# Patient Record
Sex: Female | Born: 1935 | Race: Black or African American | Hispanic: No | Smoking: Never smoker
Health system: Southern US, Community
[De-identification: ages and names within clinical notes are randomized; demographics above are authoritative.]

## PROBLEM LIST (undated history)

## (undated) DIAGNOSIS — I2510 Atherosclerotic heart disease of native coronary artery without angina pectoris: Secondary | ICD-10-CM

## (undated) DIAGNOSIS — I82409 Acute embolism and thrombosis of unspecified deep veins of unspecified lower extremity: Secondary | ICD-10-CM

## (undated) DIAGNOSIS — E871 Hypo-osmolality and hyponatremia: Secondary | ICD-10-CM

## (undated) DIAGNOSIS — I639 Cerebral infarction, unspecified: Secondary | ICD-10-CM

## (undated) DIAGNOSIS — R569 Unspecified convulsions: Secondary | ICD-10-CM

## (undated) DIAGNOSIS — Z8739 Personal history of other diseases of the musculoskeletal system and connective tissue: Secondary | ICD-10-CM

## (undated) DIAGNOSIS — H269 Unspecified cataract: Secondary | ICD-10-CM

## (undated) DIAGNOSIS — M316 Other giant cell arteritis: Secondary | ICD-10-CM

## (undated) DIAGNOSIS — F0390 Unspecified dementia without behavioral disturbance: Secondary | ICD-10-CM

## (undated) DIAGNOSIS — I679 Cerebrovascular disease, unspecified: Secondary | ICD-10-CM

## (undated) DIAGNOSIS — K8020 Calculus of gallbladder without cholecystitis without obstruction: Secondary | ICD-10-CM

## (undated) DIAGNOSIS — E785 Hyperlipidemia, unspecified: Secondary | ICD-10-CM

## (undated) DIAGNOSIS — N183 Chronic kidney disease, stage 3 unspecified: Secondary | ICD-10-CM

## (undated) DIAGNOSIS — N179 Acute kidney failure, unspecified: Secondary | ICD-10-CM

## (undated) DIAGNOSIS — H409 Unspecified glaucoma: Secondary | ICD-10-CM

## (undated) DIAGNOSIS — M1990 Unspecified osteoarthritis, unspecified site: Secondary | ICD-10-CM

## (undated) DIAGNOSIS — I469 Cardiac arrest, cause unspecified: Secondary | ICD-10-CM

## (undated) DIAGNOSIS — I10 Essential (primary) hypertension: Secondary | ICD-10-CM

## (undated) HISTORY — PX: ABDOMINAL HYSTERECTOMY: SHX81

## (undated) HISTORY — DX: Calculus of gallbladder without cholecystitis without obstruction: K80.20

## (undated) HISTORY — DX: Chronic kidney disease, stage 3 unspecified: N18.30

## (undated) HISTORY — PX: CORONARY ANGIOPLASTY WITH STENT PLACEMENT: SHX49

## (undated) HISTORY — DX: Cardiac arrest, cause unspecified: I46.9

## (undated) HISTORY — DX: Unspecified glaucoma: H40.9

## (undated) HISTORY — DX: Unspecified cataract: H26.9

## (undated) HISTORY — DX: Personal history of other diseases of the musculoskeletal system and connective tissue: Z87.39

## (undated) HISTORY — DX: Acute kidney failure, unspecified: N17.9

## (undated) HISTORY — DX: Chronic kidney disease, stage 3 (moderate): N18.3

## (undated) HISTORY — PX: EYE SURGERY: SHX253

## (undated) HISTORY — DX: Unspecified convulsions: R56.9

## (undated) HISTORY — DX: Hyperlipidemia, unspecified: E78.5

---

## 1998-01-08 ENCOUNTER — Other Ambulatory Visit: Admission: RE | Admit: 1998-01-08 | Discharge: 1998-01-08 | Payer: Self-pay | Admitting: Family Medicine

## 1998-06-12 ENCOUNTER — Ambulatory Visit (HOSPITAL_COMMUNITY): Admission: RE | Admit: 1998-06-12 | Discharge: 1998-06-12 | Payer: Self-pay | Admitting: Family Medicine

## 1999-01-27 ENCOUNTER — Emergency Department (HOSPITAL_COMMUNITY): Admission: EM | Admit: 1999-01-27 | Discharge: 1999-01-27 | Payer: Self-pay | Admitting: Emergency Medicine

## 1999-08-03 ENCOUNTER — Encounter: Payer: Self-pay | Admitting: Family Medicine

## 1999-08-03 ENCOUNTER — Ambulatory Visit (HOSPITAL_COMMUNITY): Admission: RE | Admit: 1999-08-03 | Discharge: 1999-08-03 | Payer: Self-pay | Admitting: Orthopaedic Surgery

## 2004-07-19 ENCOUNTER — Emergency Department (HOSPITAL_COMMUNITY): Admission: EM | Admit: 2004-07-19 | Discharge: 2004-07-19 | Payer: Self-pay | Admitting: Emergency Medicine

## 2004-07-28 ENCOUNTER — Ambulatory Visit: Payer: Self-pay | Admitting: Family Medicine

## 2004-08-20 ENCOUNTER — Ambulatory Visit: Payer: Self-pay | Admitting: Family Medicine

## 2004-10-14 ENCOUNTER — Ambulatory Visit: Payer: Self-pay | Admitting: Family Medicine

## 2004-10-22 ENCOUNTER — Ambulatory Visit: Payer: Self-pay | Admitting: Family Medicine

## 2004-11-03 ENCOUNTER — Ambulatory Visit: Payer: Self-pay | Admitting: Family Medicine

## 2004-12-23 ENCOUNTER — Ambulatory Visit: Payer: Self-pay | Admitting: Family Medicine

## 2004-12-30 ENCOUNTER — Ambulatory Visit: Payer: Self-pay | Admitting: Family Medicine

## 2005-07-06 ENCOUNTER — Ambulatory Visit: Payer: Self-pay | Admitting: Family Medicine

## 2006-01-21 ENCOUNTER — Inpatient Hospital Stay (HOSPITAL_COMMUNITY): Admission: EM | Admit: 2006-01-21 | Discharge: 2006-01-25 | Payer: Self-pay | Admitting: Emergency Medicine

## 2006-01-21 ENCOUNTER — Ambulatory Visit: Payer: Self-pay | Admitting: Internal Medicine

## 2006-04-27 ENCOUNTER — Ambulatory Visit: Payer: Self-pay | Admitting: Family Medicine

## 2006-04-28 ENCOUNTER — Ambulatory Visit: Payer: Self-pay | Admitting: Family Medicine

## 2006-06-01 ENCOUNTER — Ambulatory Visit: Payer: Self-pay | Admitting: Family Medicine

## 2006-08-01 ENCOUNTER — Ambulatory Visit: Payer: Self-pay | Admitting: Family Medicine

## 2006-08-18 ENCOUNTER — Emergency Department (HOSPITAL_COMMUNITY): Admission: EM | Admit: 2006-08-18 | Discharge: 2006-08-18 | Payer: Self-pay | Admitting: Family Medicine

## 2006-08-29 ENCOUNTER — Ambulatory Visit: Payer: Self-pay | Admitting: Family Medicine

## 2006-09-28 ENCOUNTER — Ambulatory Visit: Payer: Self-pay | Admitting: Family Medicine

## 2006-10-18 ENCOUNTER — Ambulatory Visit: Payer: Self-pay | Admitting: Cardiology

## 2006-10-18 ENCOUNTER — Inpatient Hospital Stay (HOSPITAL_COMMUNITY): Admission: EM | Admit: 2006-10-18 | Discharge: 2006-10-22 | Payer: Self-pay | Admitting: Certified Registered"

## 2006-10-19 ENCOUNTER — Encounter: Payer: Self-pay | Admitting: Cardiology

## 2006-10-20 ENCOUNTER — Ambulatory Visit: Payer: Self-pay | Admitting: Thoracic Surgery (Cardiothoracic Vascular Surgery)

## 2006-10-26 ENCOUNTER — Ambulatory Visit: Payer: Self-pay | Admitting: Family Medicine

## 2008-06-10 ENCOUNTER — Inpatient Hospital Stay (HOSPITAL_COMMUNITY): Admission: EM | Admit: 2008-06-10 | Discharge: 2008-06-14 | Payer: Self-pay | Admitting: Emergency Medicine

## 2010-02-14 ENCOUNTER — Inpatient Hospital Stay (HOSPITAL_COMMUNITY): Admission: EM | Admit: 2010-02-14 | Discharge: 2010-02-23 | Payer: Self-pay | Admitting: Emergency Medicine

## 2010-05-13 ENCOUNTER — Ambulatory Visit: Payer: Self-pay | Admitting: Psychiatry

## 2010-08-26 ENCOUNTER — Ambulatory Visit (HOSPITAL_COMMUNITY)
Admission: RE | Admit: 2010-08-26 | Discharge: 2010-08-27 | Payer: Self-pay | Source: Home / Self Care | Attending: Ophthalmology | Admitting: Ophthalmology

## 2010-08-31 LAB — SURGICAL PCR SCREEN
MRSA, PCR: NEGATIVE
Staphylococcus aureus: POSITIVE — AB

## 2010-08-31 LAB — CBC
HCT: 42.5 % (ref 36.0–46.0)
Hemoglobin: 14.4 g/dL (ref 12.0–15.0)
MCH: 31.9 pg (ref 26.0–34.0)
MCHC: 33.9 g/dL (ref 30.0–36.0)
MCV: 94 fL (ref 78.0–100.0)
Platelets: 156 10*3/uL (ref 150–400)
RBC: 4.52 MIL/uL (ref 3.87–5.11)
RDW: 16.3 % — ABNORMAL HIGH (ref 11.5–15.5)
WBC: 5.8 10*3/uL (ref 4.0–10.5)

## 2010-08-31 LAB — BASIC METABOLIC PANEL
BUN: 20 mg/dL (ref 6–23)
CO2: 21 mEq/L (ref 19–32)
Calcium: 9.8 mg/dL (ref 8.4–10.5)
Chloride: 111 mEq/L (ref 96–112)
Creatinine, Ser: 1.51 mg/dL — ABNORMAL HIGH (ref 0.4–1.2)
GFR calc Af Amer: 40 mL/min — ABNORMAL LOW (ref >60)
GFR calc non Af Amer: 33 mL/min — ABNORMAL LOW (ref >60)
Glucose, Bld: 112 mg/dL — ABNORMAL HIGH (ref 70–99)
Potassium: 5.1 mEq/L (ref 3.5–5.1)
Sodium: 142 mEq/L (ref 135–145)

## 2010-11-01 LAB — CBC
HCT: 41 % (ref 36.0–46.0)
HCT: 43 % (ref 36.0–46.0)
Hemoglobin: 13.6 g/dL (ref 12.0–15.0)
Hemoglobin: 14.8 g/dL (ref 12.0–15.0)
MCH: 34.5 pg — ABNORMAL HIGH (ref 26.0–34.0)
MCHC: 33.2 g/dL (ref 30.0–36.0)
MCHC: 34.5 g/dL (ref 30.0–36.0)
MCV: 100 fL (ref 78.0–100.0)
Platelets: 147 10*3/uL — ABNORMAL LOW (ref 150–400)
RBC: 4.3 MIL/uL (ref 3.87–5.11)
RDW: 13.1 % (ref 11.5–15.5)
RDW: 13.2 % (ref 11.5–15.5)
WBC: 8.6 10*3/uL (ref 4.0–10.5)
WBC: 9.3 10*3/uL (ref 4.0–10.5)

## 2010-11-01 LAB — POCT I-STAT, CHEM 8
BUN: 17 mg/dL (ref 6–23)
Chloride: 97 mEq/L (ref 96–112)
Glucose, Bld: 167 mg/dL — ABNORMAL HIGH (ref 70–99)
HCT: 47 % — ABNORMAL HIGH (ref 36.0–46.0)
Hemoglobin: 16 g/dL — ABNORMAL HIGH (ref 12.0–15.0)
Potassium: 4.5 mEq/L (ref 3.5–5.1)
Sodium: 125 mEq/L — ABNORMAL LOW (ref 135–145)

## 2010-11-01 LAB — BASIC METABOLIC PANEL
BUN: 19 mg/dL (ref 6–23)
BUN: 25 mg/dL — ABNORMAL HIGH (ref 6–23)
CO2: 18 mEq/L — ABNORMAL LOW (ref 19–32)
CO2: 18 mEq/L — ABNORMAL LOW (ref 19–32)
CO2: 20 mEq/L (ref 19–32)
CO2: 22 mEq/L (ref 19–32)
Calcium: 8.2 mg/dL — ABNORMAL LOW (ref 8.4–10.5)
Calcium: 8.4 mg/dL (ref 8.4–10.5)
Calcium: 8.6 mg/dL (ref 8.4–10.5)
Calcium: 8.7 mg/dL (ref 8.4–10.5)
Calcium: 8.9 mg/dL (ref 8.4–10.5)
Calcium: 9 mg/dL (ref 8.4–10.5)
Calcium: 9.2 mg/dL (ref 8.4–10.5)
Calcium: 9.3 mg/dL (ref 8.4–10.5)
Calcium: 9.8 mg/dL (ref 8.4–10.5)
Chloride: 93 mEq/L — ABNORMAL LOW (ref 96–112)
Chloride: 94 mEq/L — ABNORMAL LOW (ref 96–112)
Chloride: 96 mEq/L (ref 96–112)
Creatinine, Ser: 1.01 mg/dL (ref 0.4–1.2)
Creatinine, Ser: 1.08 mg/dL (ref 0.4–1.2)
Creatinine, Ser: 1.85 mg/dL — ABNORMAL HIGH (ref 0.4–1.2)
GFR calc Af Amer: 32 mL/min — ABNORMAL LOW (ref >60)
GFR calc Af Amer: 40 mL/min — ABNORMAL LOW (ref >60)
GFR calc Af Amer: 45 mL/min — ABNORMAL LOW (ref >60)
GFR calc Af Amer: 48 mL/min — ABNORMAL LOW (ref >60)
GFR calc Af Amer: 54 mL/min — ABNORMAL LOW (ref >60)
GFR calc Af Amer: 60 mL/min — ABNORMAL LOW (ref >60)
GFR calc Af Amer: 60 mL/min — ABNORMAL LOW (ref >60)
GFR calc Af Amer: >60 mL/min (ref >60)
GFR calc non Af Amer: 23 mL/min — ABNORMAL LOW (ref >60)
GFR calc non Af Amer: 26 mL/min — ABNORMAL LOW (ref >60)
GFR calc non Af Amer: 31 mL/min — ABNORMAL LOW (ref >60)
GFR calc non Af Amer: 33 mL/min — ABNORMAL LOW (ref >60)
GFR calc non Af Amer: 37 mL/min — ABNORMAL LOW (ref >60)
GFR calc non Af Amer: 45 mL/min — ABNORMAL LOW (ref >60)
GFR calc non Af Amer: 49 mL/min — ABNORMAL LOW (ref >60)
GFR calc non Af Amer: 49 mL/min — ABNORMAL LOW (ref >60)
Glucose, Bld: 114 mg/dL — ABNORMAL HIGH (ref 70–99)
Glucose, Bld: 131 mg/dL — ABNORMAL HIGH (ref 70–99)
Glucose, Bld: 140 mg/dL — ABNORMAL HIGH (ref 70–99)
Glucose, Bld: 141 mg/dL — ABNORMAL HIGH (ref 70–99)
Potassium: 3 mEq/L — ABNORMAL LOW (ref 3.5–5.1)
Potassium: 4 mEq/L (ref 3.5–5.1)
Potassium: 4.1 mEq/L (ref 3.5–5.1)
Potassium: 4.1 mEq/L (ref 3.5–5.1)
Potassium: 4.2 mEq/L (ref 3.5–5.1)
Potassium: 4.4 mEq/L (ref 3.5–5.1)
Potassium: 5.1 mEq/L (ref 3.5–5.1)
Sodium: 121 mEq/L — ABNORMAL LOW (ref 135–145)
Sodium: 121 mEq/L — ABNORMAL LOW (ref 135–145)
Sodium: 125 mEq/L — ABNORMAL LOW (ref 135–145)
Sodium: 126 mEq/L — ABNORMAL LOW (ref 135–145)
Sodium: 128 mEq/L — ABNORMAL LOW (ref 135–145)
Sodium: 129 mEq/L — ABNORMAL LOW (ref 135–145)
Sodium: 133 mEq/L — ABNORMAL LOW (ref 135–145)
Sodium: 138 mEq/L (ref 135–145)
Sodium: 140 mEq/L (ref 135–145)

## 2010-11-01 LAB — RENAL FUNCTION PANEL
Albumin: 3.3 g/dL — ABNORMAL LOW (ref 3.5–5.2)
Chloride: 99 mEq/L (ref 96–112)
GFR calc non Af Amer: 32 mL/min — ABNORMAL LOW (ref >60)
Phosphorus: 4.2 mg/dL (ref 2.3–4.6)
Potassium: 3.8 mEq/L (ref 3.5–5.1)
Sodium: 132 mEq/L — ABNORMAL LOW (ref 135–145)

## 2010-11-01 LAB — URINALYSIS, ROUTINE W REFLEX MICROSCOPIC
Nitrite: NEGATIVE
Specific Gravity, Urine: 1.015 (ref 1.005–1.030)
Urobilinogen, UA: 0.2 mg/dL (ref 0.0–1.0)

## 2010-11-01 LAB — POCT CARDIAC MARKERS
CKMB, poc: 1.6 ng/mL (ref 1.0–8.0)
Troponin i, poc: <0.05 ng/mL (ref 0.00–0.09)

## 2010-11-01 LAB — URINE MICROSCOPIC-ADD ON

## 2010-11-01 LAB — GLUCOSE, CAPILLARY
Glucose-Capillary: 134 mg/dL — ABNORMAL HIGH (ref 70–99)
Glucose-Capillary: 189 mg/dL — ABNORMAL HIGH (ref 70–99)

## 2010-11-01 LAB — RPR: RPR Ser Ql: NONREACTIVE

## 2010-11-01 LAB — CARDIAC PANEL(CRET KIN+CKTOT+MB+TROPI)
CK, MB: 4.7 ng/mL — ABNORMAL HIGH (ref 0.3–4.0)
Total CK: 414 U/L — ABNORMAL HIGH (ref 7–177)
Troponin I: 0.01 ng/mL (ref 0.00–0.06)
Troponin I: 0.06 ng/mL (ref 0.00–0.06)

## 2010-11-01 LAB — VITAMIN B12: Vitamin B-12: 1499 pg/mL — ABNORMAL HIGH (ref 211–911)

## 2010-11-01 LAB — MRSA PCR SCREENING: MRSA by PCR: NEGATIVE

## 2010-11-01 LAB — DIFFERENTIAL
Basophils Relative: 1 % (ref 0–1)
Eosinophils Absolute: 0 10*3/uL (ref 0.0–0.7)
Monocytes Relative: 8 % (ref 3–12)
Neutrophils Relative %: 70 % (ref 43–77)

## 2010-11-01 LAB — TSH: TSH: 3.031 u[IU]/mL (ref 0.350–4.500)

## 2010-11-01 LAB — URINE CULTURE

## 2010-11-01 LAB — OSMOLALITY, URINE: Osmolality, Ur: 301 mOsm/kg — ABNORMAL LOW (ref 390–1090)

## 2010-11-01 LAB — SODIUM, URINE, RANDOM: Sodium, Ur: 109 mEq/L

## 2010-11-01 LAB — CK TOTAL AND CKMB (NOT AT ARMC): Relative Index: 1 (ref 0.0–2.5)

## 2010-11-18 NOTE — Op Note (Signed)
NAMEJACQUELEEN, [name]              ACCOUNT NO.:  [id number]  MEDICAL RECORD NO.:  [id number]          PATIENT TYPE:  OIB  LOCATION:  5127                         FACILITY:  [facility]  PHYSICIAN:  Chalmers Guest, M.D.     DATE OF BIRTH:  Jul 15, 1931  DATE OF PROCEDURE:  08/26/2010 DATE OF DISCHARGE:                              OPERATIVE REPORT   PREOPERATIVE DIAGNOSIS:  Visually significant complex cataract, right eye following previous glaucoma surgery, right eye with profound loss of vision.  POSTOPERATIVE DIAGNOSIS:  Visually significant complex cataract, right eye following previous glaucoma surgery, right eye with profound loss of vision.  PROCEDURE:  Phacoemulsification with intraocular lens implant using special devices trypan blue to stain the anterior capsule because of the white, mature cataract.  COMPLICATIONS:  None.  ANESTHESIA:  Consisted of 2% Xylocaine with epinephrine 50/50 mixture of 0.75% Marcaine with ampule of Wydase.  DESCRIPTION OF PROCEDURE:  The patient was given a peribulbar block in the operative suite under monitored anesthesia with the aforementioned local anesthetic agent.  Pressure was applied to the eye and then the patient's face was prepped and draped in usual sterile fashion with the surgeon sitting temporally and the lid speculum inserted.  A Weck-cel sponge was used to fixate the globe.  It was noted that there was a superior 1-80 degrees filtration bleb with a ExPress mini tube in the anterior chamber.  An 11 blade was used to enter through clear cornea at the 10:30 position.  Before the Viscoat was injected in the eye, the trypan blue was injected and then it was irrigated out with balanced salt solution and stained the anterior capsule.  Following this, an additional Weck-cel was used to fixate the globe, taking care to stay away from the superior bleb.  Following this, using a 2.75-mm keratome blade in stepwise fashion through temporal  clear cornea, the anterior chamber was entered and Viscoat was injected.  Again, a 25-gauge needle was used to incise the anterior capsule and a curvilinear continuous tear 5.5-mm capsulorrhexis was formed.  The Utrata forceps was used to remove the anterior capsule.  The nucleus was hydrodissected, hydrodelineated.  The nucleus was complete chalk white.  The nucleus was sculpted centrally.  It was sculpted into 4 quadrants.  The quadrants were cracked and then they were re-sculpted until all the quadrants were separated.  The quadrants were then aspirated individually from the eye, removing all nuclear fragments.  Following this, the IA was used to strip cortical fibers.  The posterior capsule remained intact.  Amvisc was injected in the eye and intraocular lens implant was examined.  It was an alkaline aqueous soft lens.  The lens power was 18.0 diopter lens, SN# [id number], 8.013.  The lens was placed in the lens injector and injected and unfolded in the capsular bag.  The lens was positioned . the IA was used to remove the viscoelastic from the eye.  Miostat was injected.  A 10-0 nylon suture was placed.  The eye was pressurized and there was no leakage.  All instrumentation was removed from the eye. TobraDex ointment was applied.  A patch and  Fox shield were placed and the patient returned to recovery area in stable condition.     Chalmers Guest, M.D.     RW/MEDQ  D:  08/26/2010  T:  08/27/2010  Job:  [id number]  cc:   Fax #[phone]  Electronically Signed by Chalmers Guest M.D. on 11/18/2010 02:27:59 PM

## 2010-12-29 NOTE — Discharge Summary (Signed)
NAMECARALYN, [name]              ACCOUNT NO.:  [id number]   MEDICAL RECORD NO.:  [id number]          PATIENT TYPE:  INP   LOCATION:  1426                         FACILITY:  [facility]   PHYSICIAN:  Fleet Contras, M.D.    DATE OF BIRTH:  [birth year]   DATE OF ADMISSION:  06/10/2008  DATE OF DISCHARGE:  06/14/2008                               DISCHARGE SUMMARY   HISTORY OF PRESENTING ILLNESS:  [name] is a 77 year old African  American lady with past medical history significant for coronary artery  disease, systemic hypertension, dyslipidemia, gout, and Alzheimer  dementia.  She came to the emergency room at [facility] with a  few days of progressively worsening weakness, fatigue, and confusion  associated with poor oral intake and unable to recognize some family  members.  She did not have any chest pain or shortness of breath.  She  had no abdominal pain, vomiting, diarrhea.  She had no syncope,  seizures, weakness of extremities, or slurring of speech.  At the  emergency room, her vital signs were stable.  She was confused and  disoriented.  Her initial laboratory data showed elevated potassium of  7.5 with a bicarbonate of 12, as well as elevated BUN and creatinine and  she was thought to be dehydrated with renal insufficiency and metabolic  acidosis.  CT scan of her head was negative for acute infarct or bleed.  She was therefore admitted to the hospital for further evaluation, IV  fluids, and close monitoring.   HOSPITAL COURSE:  On admission, patient was started on IV fluids with  normal saline.  Kayexalate was given initially rectally and then orally.  She received GI and DVT prophylaxis.  Her blood pressure medications  initially were put on hold but these were later resumed once her blood  pressure began to increase.  Her condition improved significantly during  hospitalization.  She became more alert, oriented, but still slightly  confused because of her  dementia.  She is now back to her baseline and  her potassium and bicarbonate have normalized.  Other laboratory test  performed including TSH and cortisol levels were within normal limits.  CPK was slightly elevated in the 400s.  This was thought to have  contributed to her elevated potassium levels.  Today she is feeling much  better.  She denies any complaints.   Her vital signs are stable with blood pressure of 128/79.  Heart rate  was 69 and regular.  Respiratory rate of 18.  Temperature is 98.0.  O2  sat on room air is 100%.  NECK:  Supple with no elevated JVD.  No cervical lymphadenopathy.  No  carotid bruit.  CHEST:  Clear to auscultation.  HEART:  Sounds 1 and 2 are heard with no murmurs.  ABDOMEN:  Full soft, nontender.  No masses.  Bowel sounds are present.  EXTREMITIES:  Show no edema or calf tenderness.  CNS:  She is alert and oriented x2 with no focal neurological deficits.   Her latest laboratory data shows sodium of 141, potassium 3.3, chloride  112, bicarbonate of 20, BUN  is 8, creatinine is 1.26, glucose is 112,  CPK is 267.  Her latest CBC on June 12, 2008, shows a white count of  7.5, hemoglobin 12.5, hematocrit 37.3, and platelet count of 133.  Hemoglobin A1c was 6.1.  She was evaluated by physical therapy and  occupational therapy and recommended for rehabilitation in a skilled  nursing facility prior to returning home as she currently lives by  herself and has family members that come in frequently to check on her.  Efforts were therefore made for her to be disposed to a skilled nursing  facility.   DISCHARGE DIAGNOSES:  1. Delirium, improved.  2. Hyperkalemia, improved.  3. Anion gap metabolic acidosis, improved with hydration.  4. Acute renal failure, improved.  5. Systemic hypertension.  6. Alzheimer dementia.   She is now considered stable for discharge to a skilled nursing facility  today.   DISPOSITION:  To a skilled nursing facility.    CONDITION ON DISCHARGE:  Stable.   This discharge plan has been discussed with her and her son and their  questions answered.      Fleet Contras, M.D.  Electronically Signed     EA/MEDQ  D:  06/14/2008  T:  06/14/2008  Job:  [id number]

## 2010-12-29 NOTE — H&P (Signed)
[name], [name]              ACCOUNT NO.:  [id number]   MEDICAL RECORD NO.:  [id number]          PATIENT TYPE:  INP   LOCATION:  1426                         FACILITY:  [facility]   PHYSICIAN:  Fleet Contras, M.D.    DATE OF BIRTH:  10-09-1930   DATE OF ADMISSION:  06/10/2008  DATE OF DISCHARGE:                              HISTORY & PHYSICAL   PRESENTING COMPLAINTS:  Weakness, fatigue, and confusion.   HISTORY OF PRESENT ILLNESS:  [name] is a 77 year old, African  American lady with past medical history significant for coronary artery  disease, systemic hypertension, dyslipidemia, gouty arthritis, and  Alzheimer's dementia.  She was brought into the Emergency Room at [facility] by her family with a few days of progressively worsening  weakness, fatigue, poor oral intake, and confusion to the extent that  today she was unable to recognize her family members.  She did not  complain of any chest pain or shortness of breath.  There was no  complaint of abdominal pain, nausea, vomiting, or diarrhea.  There was  no report of any dysuria, urinary frequency, or hematuria.  She did not  have any syncope, seizures, weakness of extremities, or slurring of  speech.  At the emergency room, her vital signs were stable.  She was  confused and disoriented.  Laboratory data including a CT scan of the  head was negative.  A blood work showed markedly elevated potassium at  7.5 with a bicarbonate level of 14 as well as elevated BUN and  creatinine.  She was therefore admitted to the hospital for further  evaluation of severe hyperglycemia, renal insufficiency, and metabolic  acidosis.   PAST MEDICAL HISTORY:  Review of her old records revealed that the  patient was admitted about 18 months ago with similar symptoms and  findings of metabolic acidosis, hyperglycemia, and renal insufficiency.  She has past medical history significant for systemic hypertension,  dyslipidemia, gouty  arthritis, coronary artery disease, chronic kidney  disease, glaucoma, and Alzheimer's dementia.   CURRENT MEDICATIONS:  1. Toprol XL 50 mg daily.  2. Hydrochlorothiazide 25 mg daily.  3. Xyzal 5 mg daily.  4. Colchicine 0.6 mg b.i.d. p.r.n.  5. Multivitamin one a day.  6. Zetia 10 mg daily.  7. Cosopt eye drops 1 drop bilaterally b.i.d.  8. She uses Tylenol p.r.n. for joint pains.   ALLERGIES:  She has no known drug allergies.   FAMILY HISTORY AND SOCIAL HISTORY:  She currently lives with her family  at home.  She is fairly independent.  She denies any use of tobacco or  illicit drugs.  Drinks alcohol occasionally.   REVIEW OF SYSTEMS:  Essentially as above in HPI.   PHYSICAL EXAMINATION:  GENERAL:  [name] is an elderly lady lying  comfortably in the hospital bed not in acute respiratory or painful  distress.  She is not pale.  She is not icteric.  She is not cyanosed.  She is mildly dehydrated with dry oral and tongue mucosa.  Pupils are  equal and reactive to light and accommodation.  Vital signs:  Her initial vital signs shows a blood pressure of 130/50,  heart rate of 97 and regular, respiratory rate of 19.  Temperature is  99.2.  O2 sats on room air is 97%.  NECK:  Supple with no elevated JVD.  No cervical lymphadenopathy.  CHEST:  Good air entry bilaterally with no rales, rhonchi, or wheezes.  HEART:  Heart sounds 1 and 2 are heard.  No murmurs.  No S3 gallops.  ABDOMEN:  Obese, soft, nontender.  No masses.  Bowel sounds are present.  EXTREMITIES:  No edema.  No calf tenderness or swelling.  CNS:  She is alert and responsive, but she is disoriented to place and  time.  There are no focal neurological deficits.   Initial laboratory data shows a sodium of 130, potassium 7.5, chloride  110, bicarbonate of 12, BUN 26, creatinine 2.55, glucose 128, AST 21,  ALT 12, alkaline phosphatase 46, total bilirubin 0.5, albumin 3.4, white  count 7, hemoglobin 13.8, hematocrit  40.6, platelet count of 165,000.  CT scan of the head is reported as showing no evidence of acute  intracranial hemorrhage, brain edema or mass, mass effect, or midline  shift.  Chest x-ray showed no acute cardiopulmonary disease.  Urinalysis  is negative for nitrite, leukocyte esterase, or blood.   ASSESSMENT:  [name] is a 77 year old, African American lady with  multiple medical problems admitted via the Emergency Room with weakness,  fatigue, poor oral intake, and confusion.   ADMISSION DIAGNOSES:  1. Delirium.  Most likely this is metabolic in origin.  2. Severe hypokalemia.  3. Non anion gap metabolic acidosis.  4. Acute on chronic kidney disease.  5. History of dementia.   PLAN OF CARE:  She will be admitted to telemetry bed.  She received  Kayexalate p.o. or rectally q.6, 30 gm x4 doses, IV fluids with normal  saline at 75 mL/hr.  This will be adjusted based on her response.  Strict inputs and outputs to be monitored.  DVT and GI prophylaxis  provided.  Her home medication will be continued as above except HCTZ  will be put on hold until she is stable.  This plan of care has been  discussed with her and her family, and their questions answered.      Fleet Contras, M.D.  Electronically Signed     EA/MEDQ  D:  06/11/2008  T:  06/11/2008  Job:  [id number]

## 2010-12-29 NOTE — Discharge Summary (Signed)
[name], STURDY              ACCOUNT NO.:  [id number]   MEDICAL RECORD NO.:  [id number]          PATIENT TYPE:  INP   LOCATION:  1426                         FACILITY[facility]   PHYSICIAN:  Fleet Contras, M.D.    DATE OF BIRTH:  10/05/1930   DATE OF ADMISSION:  06/10/2008  DATE OF DISCHARGE:                               DISCHARGE SUMMARY   ADDENDUM   Please add to discharge diagnoses:  Mild rhabdomyolysis.   DISCHARGE MEDICATIONS:  1. Claritin 10 mg p.o. daily.  2. Cosopt eye drops 1 drop b.i.d.  3. HCTZ 25 mg p.o. daily.  4. Metoprolol 50 mg p.o. b.i.d.  5. Lumigan 0.03% eye drops 1 drop b.i.d.  6. Multivitamin 1 p.o. daily.  7. Calcium with vitamin D 1 p.o. b.i.d.  8. Zetia 10 mg p.o. daily.      Fleet Contras, M.D.  Electronically Signed     EA/MEDQ  D:  06/14/2008  T:  06/14/2008  Job:  [id number]

## 2011-01-01 NOTE — Consult Note (Signed)
NAMESTERLING, UCCI              ACCOUNT NO.:  [id number]   MEDICAL RECORD NO.:  [id number]          PATIENT TYPE:  INP   LOCATION:  6527                         FACILITY:  [facility]   PHYSICIAN:  Brayton El, MD    DATE OF BIRTH:  May 24, 1931   DATE OF CONSULTATION:  DATE OF DISCHARGE:                                 CONSULTATION   CHIEF COMPLAINT:  Chest pain.   HISTORY OF PRESENT ILLNESS:  The patient is a 77 year old black female  with a past medical history significant for hypertension and  hyperlipidemia presenting with sharp fleeting chest pain.  The patient  states that for several years she has experienced infrequent sharp chest  pain that is associated with anxiety.  She states the chest pain occurs  maybe once a month and is always self limiting.  It is usually  associated with stressors in her life.  Tonight, the patient was not  feeling anxious but experienced the symptom of sharp chest pain that is  identical  to her usual chest pain.  The patient describes it as  starting under her left breast and radiating to her right breast.  It is  sharp, lasting seconds to minutes.  The patient states this pain is  associated with shortness of breath and diaphoresis; however, when she  experienced this pain before it has also been associated with shortness  of breath and diaphoresis.  The patient does endorse dyspnea on exertion  and two-to-three pillow orthopnea that appears to be chronic in nature.   PAST MEDICAL HISTORY:  1. The patient has a history of cerebrovascular disease by MRI.  2. She has a questionable history of coronary artery disease as she      thinks she may have had a left heart catheterization in the 1970s;      however, she is very vague about the procedure that was performed      on her.  3. She has hypertension.  4. Hyperlipidemia.  5. Gout.  6. Arthritis.  7. Glaucoma.  8. History of acute renal failure.  9. According to the chart, the patient has  diabetes, but she denies      this diagnosis.   PAST SURGICAL HISTORY:  She is status post hysterectomy.   SOCIAL HISTORY:  Denies any tobacco use, rare alcohol use.   FAMILY HISTORY:  Negative for premature coronary disease.   ALLERGIES:  No known drug allergies.   MEDICATIONS AT HOME:  1. Toprol XL 100 mg p.o. daily.  2. Zetia 10 mg p.o. daily.  3. Hydrochlorothiazide 25 mg p.o. daily.  4. Colchicine.  5. Multivitamin.  6. Eye drops.   REVIEW OF SYSTEMS:  Positive for dyspnea on exertion, positive for  orthopnea, positive for occasional anxiety, positive for occasional  palpitations specifically negative for any bright red blood per rectum,  vomiting, or diarrhea.   Other systems are reviewed and are negative.   PHYSICAL EXAMINATION:  Temperature 97.7, pulse 68, respirations 20,  blood pressure 100/67.  GENERAL:  She is in no acute distress.  HEENT:  Normocephalic, atraumatic.  NECK:  Supple without JVD or bruits.  HEART:  Regular rate and rhythm, distant heart sounds but murmur, rub,  or gallop.  LUNGS:  Clear to auscultation bilaterally.  ABDOMEN:  Soft, nontender, nondistended.  EXTREMITIES:  No edema.  SKIN:  No rashes or lesions.  NEUROLOGIC:  Nonfocal.  PSYCHIATRIC:  The patient is appropriate with relatively normal levels  of insight.   Chest x-ray is negative for any acute disease.  EKG:  Normal sinus  rhythm with T wave flattening in leads V2, V3,   LABORATORY DATA:  Sodium 137, potassium 4.6, chloride 109, CO2 19, BUN  14, creatinine 1.48, glucose 72, white count 5.4, hemoglobin 13.1,  hematocrit 41.5, platelets 165, calcium 8.9, AST 46, ALT 45, total  protein 7.3, albumin 3.6, troponin less than 0.05 x2.  CK and CK-MB are  within normal limits.   ASSESSMENT:  Atypical chest pain in a female with multiple risk factors.   PLAN:  I would recommend ruling out myocardial infarction while keeping  the patient on telemetry.  Would make the patient n.p.o.  after midnight  for a probable stress test in the morning.  Would also check an  echocardiogram as the patient seems to be endorsing some symptoms  consistent with heart failure.  Would also check a fasting lipid panel  in the morning, and if the patient has not had a prior history of statin  intolerance, would recommend either adding a statin to the Zetia or  replacing Zetia with a statin.  Would continue the patient on aspirin,  Toprol, and low-molecular weight heparin for DVT prophylaxis.  Lastly,  would consider ordering a mammogram in this patient as she has not had  one in quite some time, and this pain does seem to consistently start  around the breast area.      Brayton El, MD  Electronically Signed     SGA/MEDQ  D:  10/18/2006  T:  10/19/2006  Job:  [id number]

## 2011-01-01 NOTE — Discharge Summary (Signed)
NAMELATARSHA, [name]              ACCOUNT NO.:  [id number]   MEDICAL RECORD NO.:  [id number]          PATIENT TYPE:  INP   LOCATION:  6527                         FACILITY:  [facility]   PHYSICIAN:  Mobolaji B. Bakare, M.D.DATE OF BIRTH:  [birth year]   DATE OF ADMISSION:  10/18/2006  DATE OF DISCHARGE:                               DISCHARGE SUMMARY   ADDENDUM:   PERTINENT LABORATORY DATA:  Three sets of cardiac enzymes were  essentially negative.  Hemoglobin A1c was 6.3 (despite this fasting  blood sugar in the morning have been normal).  Fasting lipid profile:  Total cholesterol 163, HDL 32, LDL 113.  Discharge B-MET:  Sodium 140,  potassium 3.9, chloride 113, CO2 20, glucose 86, BUN 6, creatinine 0.09.  GFR 49.  Calcium 8.6.  White cells 4.7, hemoglobin 11.5, platelets 120.      Mobolaji B. Corky Downs, M.D.  Electronically Signed     MBB/MEDQ  D:  10/22/2006  T:  10/22/2006  Job:  [id number]   cc:   Dala Dock

## 2011-01-01 NOTE — Consult Note (Signed)
NAMESYRENITY, KLEPACKI              ACCOUNT NO.:  [id number]   MEDICAL RECORD NO.:  [id number]          PATIENT TYPE:  INP   LOCATION:  6527                         FACILITY:  [facility]   PHYSICIAN:  Salvatore Decent. Dorris Fetch, M.D.DATE OF BIRTH:  05/04/1931   DATE OF CONSULTATION:  10/20/2006  DATE OF DISCHARGE:                                 CONSULTATION   [name] is a 77 year old woman with a history of hypertension,  cholesterolemia and glaucoma and questionable type 2 diabetes.  She  presented on October 18, 2006, after having two episodes of chest pain.  The first episode was after she took a shower.  She sat down on her bed  and she had a sharp pain under her left breast.  This lasted a few  minutes and resolves spontaneously.  About a half hour later, after  answering the door, she once again sat down and this time had pain under  her right breast,  similar in nature, sharp, intense pain which again  lasted 2-3 minutes and resolved spontaneously.  After the second episode  she became concerned and came to the emergency room.  She was ruled out  for myocardial infarction by enzymes and EKG.  She had a CT scan done  yesterday which showed no evidence of pulmonary embolus.  There was a  question of some irregularity and possible descending aortic dissection.  A TEE was done today which showed no sign of dissection.  There was  atheromata noted in the descending aorta which likely accounts for the  CT findings.  The patient has not had any further pain since the second  episode and specifically has not had any pain since admission to the  hospital.   PAST MEDICAL HISTORY:  1. Hypertension.  2. Hypercholesterolemia.  3. Gout.  4. Degenerative joint disease.  5. Questionable type 2 diabetes and glaucoma.  6. History of transient acute renal failure in the setting of UTI and      volume depletion.  7. Hysterectomy.  8. Also question of a TIA.  She does have documented  cerebrovascular      disease by MRI and a questionable history of coronary disease with      a cath in the 1970s.   MEDICATIONS ON ADMISSION:  1. Toprol XL 100 mg a day.  2. Zetia 10 mg daily.  3. Hydrochlorothiazide 25 mg daily.  4. Cosopt eye drops.  5. Colchicine 0.6 mg daily.  6. Multivitamin one daily.   ALLERGIES:  NO KNOWN DRUG ALLERGIES.   FAMILY HISTORY:  Noncontributory.   SOCIAL HISTORY:  She is a retired Child psychotherapist.  She does not smoke.  She is  widowed.   REVIEW OF SYSTEMS:  She does get short of breath with exertion and has  some occasional orthopnea.  She has had palpitations as well as chest  pain.  She has had dizziness and nausea.  All other systems are  negative.   PHYSICAL EXAMINATION:  GENERAL APPEARANCE:  [name] is a thin  elderly black female in no acute distress.  VITAL SIGNS:  Blood pressure is  92/70, pulse 71, respirations are 20,  room air oxygen saturation is 97%.  NEUROLOGIC:  She is alert and oriented x3.  She is appropriate.  There  was no focal motor deficits.  HEENT:  Unremarkable.  NECK:  Supple without thyromegaly, adenopathy or bruits.  CARDIAC:  Regular rate and rhythm.  Normal S1 and S2, no murmurs, rubs,  or gallops.  LUNGS:  Clear with equal breath sounds bilaterally.  EXTREMITIES:  Diminished pulses throughout.  Unable to palpate dorsalis  pedis or posterior tibial, she does have 2+ femorals which are equal  bilaterally.  She has 1+ radials which are equal bilaterally.   LABORATORY DATA:  Cardiac enzymes are negative.  Troponin 0.01.  Sodium  134, potassium 3.3, BUN and creatinine are 12 and 1.5, chloride 102 and  bicarb 17.   CT of the chest was reviewed with Dr. Chestine Spore of radiology.  This was done  to rule out PE, which was negative.  There is some irregularity of the  signal in the descending aorta and transverse aortic arch.  There is no  definite flap but a dissection cannot be ruled out.  TEE report was  reviewed and it  showed extensive atheromata of the descending aorta.  There was no flap seen.   IMPRESSION:  [name] is a 77 year old who presents with atypical  chest pain.  This pain is not consistent with aortic dissection.  However, the question of an aortic dissection has been raised by CT and  she does have aortic disease by transesophageal echo.  The index of  suspicion for aortic dissection is low, in fact if she does turn out to  have an aortic dissection, I doubt that this is related to her  presenting pain complaints; however, I do think it would reasonable to  do an MRI of the thoracic aorta to definitively rule out the possibility  of an aortic dissection.           ______________________________  Salvatore Decent Dorris Fetch, M.D.     SCH/MEDQ  D:  10/20/2006  T:  10/20/2006  Job:  [id number]   cc:   Mobolaji B. Corky Downs, M.D.

## 2011-01-01 NOTE — Discharge Summary (Signed)
[name], [name]              ACCOUNT NO.:  [id number]   MEDICAL RECORD NO.:  [id number]          PATIENT TYPE:  INP   LOCATION:  6527                         FACILITY:  [facility]   PHYSICIAN:  Mobolaji B. Bakare, M.D.DATE OF BIRTH:  [birth year]   DATE OF ADMISSION:  10/18/2006  DATE OF DISCHARGE:                               DISCHARGE SUMMARY   PRIMARY CARE PHYSICIAN:  Unassigned.   CONSULT:  1. Cardiologic consult provided by [facility].  2. Cardiothoracic consult provided by Dr. Dorris Fetch.   FINAL DIAGNOSES:  1. Atypical chest pain.  2. Negative Cardiolite stress test.  3. Urinary tract infection.  4. Borderline hypotension, resolved.  5. Non-gap metabolic acidosis, resolved.  6. Hypomagnesemia.  7. Cholelithiasis noted on chest CT.   SECONDARY DIAGNOSES:  1. Coronary artery disease.  2. Dyslipidemia.  3. Gout.   PROCEDURES:  1. Chest x-ray done on the 4th of March 2008 showed no active      cardiopulmonary disease.  2. CT angiogram of the chest showed possible aortic dissection      involving the transverse and descending aorta which is __________      and no evidence of pulmonary embolism, small aorta, and near      cholelithiasis.  3. MRA of the chest without contrast showed no evidence of aortic      dissection.  It was felt that the previously questioned abnormality      on CT scan is due to flow artifact.  4. Transesophageal echocardiogram done on October 19, 2006 showed no      clear evidence of aortic dissection.  There was no atrial appendage      thrombus.  Ejection fraction was 60-65%.  No evidence of left      ventricular regional wall motion abnormality.  Left ventricular      systolic function was normal.  There was a moderately extensive      mild atheroma of the descending aorta with no ulceration or      associated thrombus.  5. Nuclear stress test showed no evidence of ischemia, normal study,      normal left ventricle.   </   HOSPITAL  COURSE:  1. Please refer to the admission H&P.  In brief, Ms. Buis is a 13-      year-old African-American female with history of CAD and cardiac      catheterization over 30 years ago.  She presented to the emergency      room with chest pain which lasted 1-2 minutes and radiated into her      back and neck, and she was diaphoretic with this episode.  She was      also dizzy on initial evaluation in the emergency room.  The      patient had a normal EKG.  Cardiac markers were normal.  She had a      chest x-ray which was unremarkable.  She underwent a CT scan      angiogram of the chest to rule out pulmonary embolism.  This was      negative for PE; however,  there was a suspicion for aortic      dissection.  The patient was admitted, and cardiology was consulted      for a transesophageal echocardiogram.  The patient's chest pain was      completely resolved prior to hospitalization.  Given the intensity      of the pain and the description, concern was for coronary artery      disease and also in view of the CT angiogram of the chest finding,      we needed to rule out aortic dissection.  She did rule out for      myocardial infarction with 3 negative sets of cardiac enzymes.  The      patient also underwent TEE which did not show any definite      dissection.  MRA of the aorta was recommended given the conflicting      reports between CT angiogram of the chest and transesophageal      echocardiogram.  In addition, cardiothoracic consult was obtained.      This was kindly provided by Dr. Dorris Fetch.  HE Opined that this      is unlikely to be aortic dissection in review to the imaging      studies, and I agreed with pursuing MRA of the aorta.  MRA      confirmed no dissection.  The patient did not have recurrence of      this pain.  He underwent nuclear stress test which was normal      study.  No ischemia.  She had a normal LV.  The patient will be      discharged home on previous  medications.  The etiology of this      chest pain is unclear.  2. Urinary tract infection.  She had pyuria noted on urinalysis.  She      was started on ciprofloxacin.  There was no fever or elevated white      cell count.  She would have treatment for 5 days.  Urine culture is      pending at the time of discharge.  3. Non-gap metabolic acidosis was noted on initial analysis.  There      was obvious cause of this.  She was noted to bicarb of 17 at the      time of admission.  Upon discharge, bicarb was 20.  Her urinary pH      was 5.5.  It is not clear if this is renal tubular acidosis related      to renal insufficiency.  She has a GFR of 35.  Further monitoring      as per patient's PCP is recommended.  4. Borderline hypotension.  Blood pressure was in the low 90s-100      during the course of hospitalization mainly during the course of      first 24-48 hours of hospitalization.  On admission, blood pressure      was 100/67.  The patient was placed on Lopressor 50 mg daily;      however, she was supposed to have been on Toprol XL 100 mg daily ,      blood pressure was borderline.  She responded to IV fluid and blood      pressure at the time of discharge was between 110/66 to 148/82.      She would resume Toprol XL at 50 mg daily.  That adjustment to be      made by primary  care physician.  5. Hypomagnesemia.  The patient had a low potassium.  I checked      magnesium.  It was 1.4.  She has been repleted with magnesium      oxide.  6. Dyslipidemia.  Fasting lipid profile was reasonable.  She was      continued on Zetia.  7. Gout.  She did not have any flare up during the course of      hospitalization.   DISCHARGE MEDICATIONS:  1. Toprol XL 50 mg p.o. 1 day  2. Zetia 10 mg daily.  3. Hydrochlorothiazide 25 mg daily.  4. Cosopt eye drops twice daily.  5. Colchicine 0.6 mg daily.  6. Multivitamin 1 daily.  7. Aspirin 81 mg daily.  8. Ciprofloxacin 500 mg daily x3 more days. 9.  Magnesium oxide 400 mg b.i.d. x7 days.   DISCHARGE CONDITION:  The patient was hemodynamically stable.  She was  chest pain free.  Vitals upon discharge:  Temperature 98.1, pulse 72,  blood pressure 117/66, O2 saturations of 98% on room air, weight  51pounds.      Mobolaji B. Corky Downs, M.D.  Electronically Signed     MBB/MEDQ  D:  10/22/2006  T:  10/22/2006  Job:  [id number]

## 2011-01-01 NOTE — H&P (Signed)
NAMEDEZIYAH, ARVIN              ACCOUNT NO.:  [id number]   MEDICAL RECORD NO.:  [id number]          PATIENT TYPE:  EMS   LOCATION:  MAJO                         FACILITY:  [facility]   PHYSICIAN:  Michaelyn Barter, M.D. DATE OF BIRTH:  09-Feb-1931   DATE OF ADMISSION:  10/18/2006  DATE OF DISCHARGE:                              HISTORY & PHYSICAL   PRIMARY CARE PHYSICIAN:  [facility].   CHIEF COMPLAINT:  Chest pain.   HISTORY OF PRESENT ILLNESS:  Ms. [name] is a 77 year old female with a  past medical history of hypertension, dyslipidemia, gout, who states  that she had at least 2 episodes of chest pain this morning.  The first  episode occurred around 10 o'clock a.m.  She states that, just prior to  the chest pain, she had taken a shower.  She then returned to her bed  and sat on the bed.  The chest pain started.  The second episode of  chest pain started around 12 p.m.  The pain was described as sharp, pin  like.  It was located under the patient's right breast and lasted  approximately 1-2 minutes.  No radiation to the neck, back, arm.  The  patient became diaphoretic at that time.  She developed dizziness, no  vomiting, no fevers or chills, no cough.  The patient states that last  week she also had multiple episodes of chest pain. She states that one  trigger for her chest pain is becoming emotionally upset, although she  does not complain of being emotionally upset this morning when she had  the 2 episodes of chest pain.  She does complain of occasional shortness  of breath each day.  She states that her ability to ambulate is very  limited secondary to her history of gout an arthritis.  She cannot  identify any additional aggravating factors for her chest pain other  than becoming emotionally upset.   PAST MEDICAL HISTORY:  1. Per electronic chart, the patient had an episode of delirium      secondary to volume depletion and urinary tract infection back in      June  2007.  2. Urinary tract infection.  3. Acute renal failure secondary to volume depletion.  4. Non-insulin-dependent diabetes mellitus, although the patient      denies having a history of diabetes mellitus.  5. Dyslipidemia.  6. Hypertension.  7. Degenerative disk disease.  8. Gout.  9. Glaucoma.  10.Arthritis.  11.Questionable stroke in the 1970s.  12.Cardiac catheterization done many years ago.  The patient states      that she cannot recall the name of the cardiologist and actually      stated that it had been probably at least 30 years since she had      the catheterization done.  She states that she believes she may      have been told that there was some decreased blood flow to the      coronary arteries.  13.Electronic chart lists myocardial infarction in the 1970s, but the      patient cannot remember whether or not  she did have an MI.   CURRENT MEDICATIONS:  The patient cannot remember; however, per ER  records, the patient takes:  1. Tylenol.  2. Toprol XL 100 mg p.o. daily.  3. Zetia 10 mg p.o. daily.  4. Hydrochlorothiazide 25 mg p.o. daily.  5. Cosopt 1 drop b.i.d.  6. Colchicine 0.6 mg daily.  7. Multivitamins 1 tablet p.o. daily.   SOCIAL HISTORY:  Cigarettes: The patient denies.  Alcohol: Occasionally.   FAMILY HISTORY:  Mother had history of hypertension.  Father had history  of hypertension.   REVIEW OF SYSTEMS:  As per HPI.   PHYSICAL EXAMINATION:  VITAL SIGNS: Temperature 97.7, blood pressure  100/67, heart rate 68, respirations 20, O2 saturation 98% on room air.  HEENT: Normocephalic and atraumatic.  Anicteric.  Extraocular movements  are intact.  Pupils equally reactive to light.  Oral mucosa is pink.  No  thrush, no exudate.  NECK:  No JVD, supple.  No lymphadenopathy.  Thyroid is not palpable.  CARDIAC:  S1, S2 present.  Regular rate and rhythm.  No murmurs,  gallops, or rubs.  RESPIRATORY: No crackles or wheezes.  ABDOMEN: Soft, nontender,  nondistended.  Positive bowel sounds.  No  masses palpated.  EXTREMITIES:  No leg edema.  NEUROLOGIC: Alert and oriented to person, place.  The year was 51.  MUSCULOSKELETAL:  5/5 upper and lower extremity strength.   LABORATORY DATA:  The pH was 7.202, pCO2 47.1, bicarb 18.5.  This is  venous.  Hemoglobin 15.3, hematocrit 45.0.  Total protein 7.3, albumin  3.6, calcium 8.9, sodium 139, potassium 4.5, chloride 110, BUN 14,  creatinine 1.5, glucose 77, total bilirubin 1.0, alkaline phosphatase  82, SGOT 86, SGPT 45.   Chest x-ray reveals no active disease.   EKG reveals normal sinus rhythm.  No Q waves or ST segment changes.   ASSESSMENT AND PLAN:  1. Chest pain. The etiology is cardiac versus noncardiac.  The      patient's EKG thus far is not impressive.  The point-of-care      markers are also not impressive.  The patient does report a remote      history of having a cardiac catheterization at which time she      states she believes she was told that she had some decreased blood      flow throughout her coronary arteries.  She is very vague, and      there is no documented report of a history of cardiac      catheterization.  Likewise, there is a questionable history of      myocardial infarction per electronic chart records, but again, the      patient is very vague with regards to whether or not this is      actually accurate.  Will, therefore, cycle the patient's cardiac      enzymes x3 eight hours apart.  We will also check a 2-D      echocardiogram. In light of EKG currently being unimpressive and      point-of-care markers also being unimpressive, will check a CT scan      to rule to the presence of pulmonary embolus.  Likewise given the      fact that the patient has multiple risk factors including      hypertension, dyslipidemia, questionable history of coronary artery      disease, and a catheterization 20 years ago with questionable     blockages, we will consider  cardiology consultation.  2. Hypertension.  This currently is stable.  We will resume the      patient's prior home medications.  3. Hyperlipidemia.  Will continue to use Zetia.  Will check a fasting      lipid profile.  4. History of gout, no current complaints.  Will resume colchicine.  5. Questionable history of diabetes mellitus per electronic chart.      Will check a hemoglobin A1c.  6. History of cerebrovascular accident and myocardial infarction (MI).      Again, the patient is vague with regards to whether or not she had      an MI, but one of the notes in the electronic chart states that she      did have an MI in      the past.  Will again discuss this with cardiology.  7. Gastrointestinal prophylaxis.  Will provide Protonix.  8. Deep vein thrombosis prophylaxis.  Will provide Lovenox.      Michaelyn Barter, M.D.  Electronically Signed     OR/MEDQ  D:  10/18/2006  T:  10/18/2006  Job:  [id number]
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
## 2011-05-18 LAB — BASIC METABOLIC PANEL
BUN: 8
BUN: 9
CO2: 12 — ABNORMAL LOW
CO2: 13 — ABNORMAL LOW
CO2: 18 — ABNORMAL LOW
Calcium: 8.9
Calcium: 9
Calcium: 9.4
Chloride: 110
Chloride: 112
Creatinine, Ser: 1.24 — ABNORMAL HIGH
Creatinine, Ser: 1.26 — ABNORMAL HIGH
GFR calc Af Amer: 22 — ABNORMAL LOW
GFR calc Af Amer: 30 — ABNORMAL LOW
GFR calc Af Amer: 51 — ABNORMAL LOW
GFR calc non Af Amer: 20 — ABNORMAL LOW
GFR calc non Af Amer: 25 — ABNORMAL LOW
GFR calc non Af Amer: 34 — ABNORMAL LOW
GFR calc non Af Amer: 41 — ABNORMAL LOW
Glucose, Bld: 127 — ABNORMAL HIGH
Glucose, Bld: 148 — ABNORMAL HIGH
Potassium: 3.3 — ABNORMAL LOW
Potassium: 7.2
Sodium: 130 — ABNORMAL LOW
Sodium: 132 — ABNORMAL LOW
Sodium: 136

## 2011-05-18 LAB — CBC
HCT: 34.8 — ABNORMAL LOW
HCT: 40.6
Hemoglobin: 11.8 — ABNORMAL LOW
MCHC: 33.4
Platelets: 133 — ABNORMAL LOW
Platelets: 165
RBC: 4.14
RDW: 14.1
RDW: 14.3
WBC: 7

## 2011-05-18 LAB — COMPREHENSIVE METABOLIC PANEL
Alkaline Phosphatase: 36 — ABNORMAL LOW
BUN: 18
BUN: 27 — ABNORMAL HIGH
CO2: 17 — ABNORMAL LOW
Calcium: 8.7
Chloride: 112
Creatinine, Ser: 2.75 — ABNORMAL HIGH
GFR calc non Af Amer: 23 — ABNORMAL LOW
Glucose, Bld: 109 — ABNORMAL HIGH
Glucose, Bld: 142 — ABNORMAL HIGH
Potassium: 5.7 — ABNORMAL HIGH
Total Bilirubin: 0.6
Total Protein: 6.7
Total Protein: 7

## 2011-05-18 LAB — DIFFERENTIAL
Eosinophils Relative: 0
Lymphocytes Relative: 18
Lymphs Abs: 1.3
Monocytes Relative: 4
Neutrophils Relative %: 78 — ABNORMAL HIGH

## 2011-05-18 LAB — CK
Total CK: 267 — ABNORMAL HIGH
Total CK: 340 — ABNORMAL HIGH
Total CK: 365 — ABNORMAL HIGH

## 2011-05-18 LAB — URINE CULTURE
Colony Count: NO GROWTH
Culture: NO GROWTH

## 2011-05-18 LAB — TSH: TSH: 0.958

## 2011-05-18 LAB — URINALYSIS, ROUTINE W REFLEX MICROSCOPIC
Glucose, UA: NEGATIVE
pH: 5.5

## 2011-05-18 LAB — HEMOGLOBIN A1C: Mean Plasma Glucose: 128

## 2011-05-18 LAB — CORTISOL-AM, BLOOD: Cortisol - AM: 18.3

## 2011-05-18 LAB — SEDIMENTATION RATE: Sed Rate: 50 — ABNORMAL HIGH

## 2011-05-18 LAB — URIC ACID: Uric Acid, Serum: 6.2

## 2011-11-01 ENCOUNTER — Other Ambulatory Visit: Payer: Self-pay

## 2011-11-01 ENCOUNTER — Emergency Department (HOSPITAL_COMMUNITY)
Admission: EM | Admit: 2011-11-01 | Discharge: 2011-11-02 | Disposition: A | Discharge status: Home Health | Payer: Medicare Other | Attending: Emergency Medicine | Admitting: Emergency Medicine

## 2011-11-01 ENCOUNTER — Emergency Department (HOSPITAL_COMMUNITY): Payer: Medicare Other

## 2011-11-01 ENCOUNTER — Encounter (HOSPITAL_COMMUNITY): Payer: Self-pay | Admitting: Emergency Medicine

## 2011-11-01 DIAGNOSIS — I10 Essential (primary) hypertension: Secondary | ICD-10-CM | POA: Insufficient documentation

## 2011-11-01 DIAGNOSIS — R079 Chest pain, unspecified: Secondary | ICD-10-CM | POA: Insufficient documentation

## 2011-11-01 DIAGNOSIS — F068 Other specified mental disorders due to known physiological condition: Secondary | ICD-10-CM | POA: Insufficient documentation

## 2011-11-01 DIAGNOSIS — I2510 Atherosclerotic heart disease of native coronary artery without angina pectoris: Secondary | ICD-10-CM | POA: Insufficient documentation

## 2011-11-01 DIAGNOSIS — N179 Acute kidney failure, unspecified: Secondary | ICD-10-CM | POA: Diagnosis present

## 2011-11-01 DIAGNOSIS — Z79899 Other long term (current) drug therapy: Secondary | ICD-10-CM | POA: Insufficient documentation

## 2011-11-01 DIAGNOSIS — N289 Disorder of kidney and ureter, unspecified: Secondary | ICD-10-CM

## 2011-11-01 DIAGNOSIS — Z8673 Personal history of transient ischemic attack (TIA), and cerebral infarction without residual deficits: Secondary | ICD-10-CM | POA: Insufficient documentation

## 2011-11-01 DIAGNOSIS — K8020 Calculus of gallbladder without cholecystitis without obstruction: Secondary | ICD-10-CM | POA: Diagnosis present

## 2011-11-01 DIAGNOSIS — R110 Nausea: Secondary | ICD-10-CM | POA: Insufficient documentation

## 2011-11-01 DIAGNOSIS — R0602 Shortness of breath: Secondary | ICD-10-CM | POA: Insufficient documentation

## 2011-11-01 DIAGNOSIS — N1330 Unspecified hydronephrosis: Secondary | ICD-10-CM | POA: Insufficient documentation

## 2011-11-01 DIAGNOSIS — N390 Urinary tract infection, site not specified: Secondary | ICD-10-CM | POA: Insufficient documentation

## 2011-11-01 DIAGNOSIS — R6883 Chills (without fever): Secondary | ICD-10-CM | POA: Insufficient documentation

## 2011-11-01 DIAGNOSIS — M129 Arthropathy, unspecified: Secondary | ICD-10-CM | POA: Insufficient documentation

## 2011-11-01 DIAGNOSIS — R109 Unspecified abdominal pain: Secondary | ICD-10-CM | POA: Insufficient documentation

## 2011-11-01 HISTORY — DX: Atherosclerotic heart disease of native coronary artery without angina pectoris: I25.10

## 2011-11-01 HISTORY — DX: Cerebral infarction, unspecified: I63.9

## 2011-11-01 HISTORY — DX: Essential (primary) hypertension: I10

## 2011-11-01 HISTORY — DX: Unspecified dementia without behavioral disturbance: F03.90

## 2011-11-01 HISTORY — DX: Unspecified osteoarthritis, unspecified site: M19.90

## 2011-11-01 LAB — POCT I-STAT TROPONIN I: Troponin i, poc: 0 ng/mL (ref 0.00–0.08)

## 2011-11-01 LAB — POCT I-STAT, CHEM 8
HCT: 39 % (ref 36.0–46.0)
Hemoglobin: 13.3 g/dL (ref 12.0–15.0)
Potassium: 5.3 mEq/L — ABNORMAL HIGH (ref 3.5–5.1)
Sodium: 143 mEq/L (ref 135–145)

## 2011-11-01 LAB — CBC
Hemoglobin: 12.5 g/dL (ref 12.0–15.0)
MCHC: 34.4 g/dL (ref 30.0–36.0)
Platelets: 170 10*3/uL (ref 150–400)
RDW: 14 % (ref 11.5–15.5)

## 2011-11-01 LAB — COMPREHENSIVE METABOLIC PANEL
AST: 17 U/L (ref 0–37)
Albumin: 3.2 g/dL — ABNORMAL LOW (ref 3.5–5.2)
BUN: 27 mg/dL — ABNORMAL HIGH (ref 6–23)
CO2: 23 mEq/L (ref 19–32)
Calcium: 9.8 mg/dL (ref 8.4–10.5)
Creatinine, Ser: 2.57 mg/dL — ABNORMAL HIGH (ref 0.50–1.10)
GFR calc non Af Amer: 17 mL/min — ABNORMAL LOW (ref >90)

## 2011-11-01 MED ORDER — ONDANSETRON HCL 4 MG/2ML IJ SOLN
4.0000 mg | Freq: Once | INTRAMUSCULAR | Status: AC
  Administered 2011-11-01: 4 mg via INTRAVENOUS
  Filled 2011-11-01: qty 2

## 2011-11-01 MED ORDER — SODIUM CHLORIDE 0.9 % IV SOLN
INTRAVENOUS | Status: DC
  Administered 2011-11-01: 20:00:00 via INTRAVENOUS

## 2011-11-01 NOTE — ED Notes (Signed)
Pt st's she was having a cramping type pain under left breast yesterday.  St's called MD today and was given appt for tomorrow at 9am.  Pt denies any chest pain at this time. St's has not had any chest pain all day only feels weak.

## 2011-11-01 NOTE — ED Provider Notes (Signed)
History     CSN: [id number]  Arrival date & time 11/01/11  1708   First MD Initiated Contact with Patient 11/01/11 1846      Chief Complaint  Patient presents with  . Chest Pain    (Consider location/radiation/quality/duration/timing/severity/associated sxs/prior treatment) Patient is a 78 y.o. female presenting with chest pain. The history is provided by the patient and a relative.  Chest Pain Primary symptoms include shortness of breath, abdominal pain and nausea. Pertinent negatives for primary symptoms include no fever, no cough and no vomiting.    the patient is a 78 year old, female, who complains of right sided flank pain, associated with nausea, and chills.  She denies vomiting, or diarrhea.   She says her pain radiates from the right side across to the left side of her abdomen underneath her breast.  She has not had a cough, but she does complain of shortness of breath.  She denies chest pain.  She has not noted a fever.  She denies dysuria or hematuria.  She has had a hysterectomy, but no other abdominal surgeries.  Past Medical History  Diagnosis Date  . Arthritis   . Hypertension   . Coronary artery disease   . Stroke   . Dementia     Past Surgical History  Procedure Date  . Eye surgery   . Abdominal hysterectomy   . Coronary angioplasty with stent placement     History reviewed. No pertinent family history.  History  Substance Use Topics  . Smoking status: Former Games developer  . Smokeless tobacco: Not on file  . Alcohol Use: No    OB History    Grav Para Term Preterm Abortions TAB SAB Ect Mult Living                  Review of Systems  Constitutional: Positive for chills. Negative for fever.  Respiratory: Positive for shortness of breath. Negative for cough.   Cardiovascular: Negative for chest pain.  Gastrointestinal: Positive for nausea and abdominal pain. Negative for vomiting and diarrhea.  Genitourinary: Negative for dysuria and hematuria.    Neurological: Negative for headaches.  Psychiatric/Behavioral: Negative for confusion.  All other systems reviewed and are negative.    Allergies  Lactose intolerance (gi)  Home Medications   Current Outpatient Rx  Name Route Sig Dispense Refill  . AZELASTINE HCL 0.15 % NA SOLN Nasal Place 2 sprays into the nose 2 (two) times daily.    . BESIFLOXACIN HCL 0.6 % OP SUSP Ophthalmic Apply 1 drop to eye 2 (two) times daily.    Marland Kitchen BIMATOPROST 0.03 % OP SOLN Left Eye Place 1 drop into the left eye at bedtime.    Marland Kitchen BRIMONIDINE TARTRATE-TIMOLOL 0.2-0.5 % OP SOLN Left Eye Place 1 drop into the left eye every 12 (twelve) hours.    Marland Kitchen BRINZOLAMIDE 1 % OP SUSP Right Eye Place 1 drop into the right eye 3 (three) times daily.    Marland Kitchen BROMFENAC SODIUM 0.09 % OP SOLN Right Eye Place 1 drop into the right eye 2 (two) times daily.    Marland Kitchen CALCIUM CARBONATE-VITAMIN D 500-125 MG-UNIT PO TABS Oral Take 1 tablet by mouth 2 (two) times daily.    Marland Kitchen DICLOFENAC EPOLAMINE 1.3 % TD PTCH Transdermal Place 1 patch onto the skin every 12 (twelve) hours.    Marland Kitchen DICLOFENAC SODIUM 1 % TD GEL Topical Apply 1 application topically 2 (two) times daily as needed. For pain    . ESCITALOPRAM OXALATE 5 MG  PO TABS Oral Take 2.5 mg by mouth daily.    Marland Kitchen EZETIMIBE 10 MG PO TABS Oral Take 10 mg by mouth daily.    Marland Kitchen FEXOFENADINE HCL 180 MG PO TABS Oral Take 180 mg by mouth daily as needed. For allergies    . LABETALOL HCL 200 MG PO TABS Oral Take 200 mg by mouth daily.    Marland Kitchen LEVETIRACETAM 250 MG PO TABS Oral Take 250 mg by mouth every 12 (twelve) hours.    Marland Kitchen LEVOCETIRIZINE DIHYDROCHLORIDE 5 MG PO TABS Oral Take 5 mg by mouth every evening.    Marland Kitchen MIRTAZAPINE 15 MG PO TABS Oral Take 15 mg by mouth at bedtime.    . TRAMADOL HCL 50 MG PO TABS Oral Take 50 mg by mouth daily as needed. For pain      BP 155/78  Pulse 82  Temp(Src) 97.9 F (36.6 C) (Oral)  Resp 18  SpO2 96%  Physical Exam  Vitals reviewed. Constitutional: She is oriented to  person, place, and time. She appears well-developed and well-nourished.  HENT:  Head: Normocephalic and atraumatic.  Eyes: Conjunctivae are normal. Pupils are equal, round, and reactive to light.  Neck: Normal range of motion. Neck supple.  Cardiovascular: Normal rate.   No murmur heard. Pulmonary/Chest: Effort normal. No respiratory distress. She has no rales.  Abdominal: Soft. There is tenderness. There is no rebound and no guarding.       Mild tenderness in the right upper quadrant and right lower quadrant without peritoneal signs  Musculoskeletal: Normal range of motion.  Neurological: She is alert and oriented to person, place, and time.  Skin: Skin is warm and dry.  Psychiatric: She has a normal mood and affect. Thought content normal.    ED Course  Procedures (including critical care time) 78 year old female, presents emergency department complaining of right upper quadrant and right flank pain with nausea, but no vomiting, or diarrhea.  She hasn't shortness of breath, but no cough, and no chest pain.  She denies hematuria, dysuria.  On examination.  She has mild right upper quadrant tenderness.  She is in no distress.  We will perform laboratory testing, and a chest x-ray, and treat her symptomatically.  Labs Reviewed  CBC - Abnormal; Notable for the following:    RBC 3.74 (*)    All other components within normal limits  POCT I-STAT, CHEM 8 - Abnormal; Notable for the following:    Potassium 5.3 (*)    BUN 31 (*)    Creatinine, Ser 2.60 (*)    Glucose, Bld 130 (*)    All other components within normal limits  POCT I-STAT TROPONIN I  URINALYSIS, ROUTINE W REFLEX MICROSCOPIC  COMPREHENSIVE METABOLIC PANEL   Dg Chest 2 View  11/01/2011  *RADIOLOGY REPORT*  Clinical Data: Right flank pain and pain under left breast. Weakness.  Chills.  CHEST - 2 VIEW  Comparison: 08/24/2010  Findings:  The heart size and mediastinal contours are within normal limits.  Both lungs are clear.   The visualized skeletal structures are unremarkable. Previous right pleural effusion has resolved.  Compared with priors there is improved aeration.  IMPRESSION: No active cardiopulmonary disease.  Original Report Authenticated By: Elsie Stain, M.D.     No diagnosis found.    MDM  Renal insufficiency        Cheri Guppy, MD 11/01/11 1949

## 2011-11-01 NOTE — ED Notes (Signed)
Pt resting with family at bedside, continues to be pain free at this time.

## 2011-11-01 NOTE — ED Provider Notes (Signed)
11:54 PM I evaluated the patient.  She has mild right upper quadrant tenderness at this time.  She reports she's only urinated once today.  She feels the need to urinate at this time.  A Foley catheter will be placed to evaluate for possible urinary retention which could be the cause of her acute renal insufficiency.  Abdominal ultrasound will also be obtained to evaluate her right upper quadrant pain.  This will also be beneficial for resume evaluate for right-sided hydronephrosis as well.  If her urine and her ultrasound are normal and she has not had urinary retention her Foley catheter may be removed and she can followup with her primary care Dr. who shared he has an appointment with tomorrow at 10 AM.  Care to Dr Dierdre Highman at [address]  Lyanne Co, MD 11/01/11 2355

## 2011-11-01 NOTE — ED Notes (Signed)
Pt asked for urine sample. Pt states that she is unable to give sample at this time.

## 2011-11-01 NOTE — ED Notes (Signed)
Pt to ED for eval cramping pain from R abd that radiates to L breast; pt c/o SOB, 96% on RA; pt in NAD; pt c/o diaphoresis in AM; A&OX4

## 2011-11-02 ENCOUNTER — Emergency Department (HOSPITAL_COMMUNITY): Payer: Medicare Other

## 2011-11-02 ENCOUNTER — Encounter (HOSPITAL_COMMUNITY): Payer: Self-pay | Admitting: General Surgery

## 2011-11-02 DIAGNOSIS — K8020 Calculus of gallbladder without cholecystitis without obstruction: Secondary | ICD-10-CM

## 2011-11-02 DIAGNOSIS — R109 Unspecified abdominal pain: Secondary | ICD-10-CM

## 2011-11-02 DIAGNOSIS — N179 Acute kidney failure, unspecified: Secondary | ICD-10-CM | POA: Diagnosis present

## 2011-11-02 DIAGNOSIS — N1330 Unspecified hydronephrosis: Secondary | ICD-10-CM | POA: Diagnosis present

## 2011-11-02 LAB — URINE MICROSCOPIC-ADD ON

## 2011-11-02 LAB — URINALYSIS, ROUTINE W REFLEX MICROSCOPIC
Ketones, ur: NEGATIVE mg/dL
Nitrite: NEGATIVE
Protein, ur: 100 mg/dL — AB

## 2011-11-02 MED ORDER — TECHNETIUM TC 99M MEBROFENIN IV KIT
5.0000 | PACK | Freq: Once | INTRAVENOUS | Status: AC | PRN
  Administered 2011-11-02: 5 via INTRAVENOUS

## 2011-11-02 MED ORDER — MORPHINE SULFATE 2 MG/ML IJ SOLN
2.0000 mg | Freq: Once | INTRAMUSCULAR | Status: AC
  Administered 2011-11-02: 2 mg via INTRAVENOUS
  Filled 2011-11-02: qty 1

## 2011-11-02 MED ORDER — DEXTROSE 5 % IV SOLN
1.0000 g | Freq: Once | INTRAVENOUS | Status: AC
  Administered 2011-11-02: 1 g via INTRAVENOUS
  Filled 2011-11-02: qty 10

## 2011-11-02 MED ORDER — CEPHALEXIN 500 MG PO CAPS: 500.0000 mg | ORAL_CAPSULE | Freq: Three times a day (TID) | ORAL | Status: AC

## 2011-11-02 NOTE — ED Notes (Signed)
Transported for hydascan

## 2011-11-02 NOTE — Consult Note (Signed)
I have seen and examined the patient and agree with the assessment and plans.  Monick Rena A. Dabney Dever  MD, FACS  

## 2011-11-02 NOTE — ED Notes (Signed)
Surgeon at bedside.  

## 2011-11-02 NOTE — ED Provider Notes (Signed)
Medical screening examination/treatment/procedure(s) were performed by non-physician practitioner and as supervising physician I was immediately available for consultation/collaboration.  Cheri Guppy, MD 11/02/11 1943

## 2011-11-02 NOTE — Discharge Instructions (Signed)
Please call Dr Concepcion Elk today to schedule a close follow up appointment.  Take the medication as prescribed.  Please have Dr Concepcion Elk recheck you kidney function (laboratory test) by next week.  You may return to the ER at any time for worsening condition or any new symptoms that concern you.   Urinary Tract Infection A urinary tract infection (UTI) is often caused by a germ (bacteria). A UTI is usually helped with medicine (antibiotics) that kills germs. Take all the medicine until it is gone. Do this even if you are feeling better. You are usually better in 7 to 10 days. HOME CARE   Drink enough water and fluids to keep your pee (urine) clear or pale yellow. Drink:   Cranberry juice.   Water.   Avoid:   Caffeine.   Tea.   Bubbly (carbonated) drinks.   Alcohol.   Only take medicine as told by your doctor.   To prevent further infections:   Pee often.   After pooping (bowel movement), women should wipe from front to back. Use each tissue only once.   Pee before and after having sex (intercourse).  Ask your doctor when your test results will be ready. Make sure you follow up and get your test results.  GET HELP RIGHT AWAY IF:   There is very bad back pain or lower belly (abdominal) pain.   You get the chills.   You have a fever.   Your baby is older than 3 months with a rectal temperature of 102 F (38.9 C) or higher.   Your baby is 68 months old or younger with a rectal temperature of 100.4 F (38 C) or higher.   You feel sick to your stomach (nauseous) or throw up (vomit).   There is continued burning with peeing.   Your problems are not better in 3 days. Return sooner if you are getting worse.  MAKE SURE YOU:   Understand these instructions.   Will watch your condition.   Will get help right away if you are not doing well or get worse.  Document Released: 01/19/2008 Document Revised: 07/22/2011 Document Reviewed: 01/19/2008 [facility] Patient Information  2012 [place], [place].  Hydronephrosis Hydronephrosis is an abnormal enlargement of your kidney. It can affect one or both the kidneys. It results from the backward pressure of urine on the kidneys, when the flow of urine is blocked. Normally, the urine drains from the kidney through the urine tube (ureter), into a sac which holds the urine until urination (bladder). When the urinary flow is blocked, the urine collects above the block. This causes an increase in the pressure inside the kidney, which in turn leads to its enlargement. The block can occur at the point where the kidney joins the ureter. Treatment depends on the cause and location of the block.  CAUSES  The causes of this condition include:  Birth defect of the kidney or ureter.   Kink at the point where the kidney joins the ureter.   Stones and blood clots in the kidney or ureter.   Cancer, injury, or infection of the ureter.   Scar tissue formation.   Backflow of urine (reflux).   Cancer of bladder or prostate gland.   Abnormality of the nerves or muscles of the kidney or ureter.   Lower part of the ureter protruding into the bladder (ureterocele).   Abnormal contractions of the bladder.   Both the kidneys can be affected during pregnancy. This is because the enlarging uterus  presses on the ureters and blocks the flow of urine.  SYMPTOMS  The symptoms depend on the location of the block. They also depend on how long the block has been present. You may feel pain on the affected side. Sometimes, you may not have any symptoms. There may be a dull ache or discomfort in the flank. The common symptoms are:  Flank pain.   Swelling of the abdomen.   Pain in the abdomen.   Nausea and vomiting.   Fever.   Pain while passing urine.   Urgency for urination.   Frequent or urgent urination.   Infection of the urinary tract.  DIAGNOSIS  Your caregiver will examine you after asking about your symptoms. You may be asked to  do blood and urine tests. Your caregiver may order a special X-ray, ultrasound, or CT scan. Sometimes a rigid or flexible telescope (cystoscope) is used to view the site of the blockage.  TREATMENT  Treatment depends on the site, cause, and duration of the block. The goal of treatment is to remove the blockage. Your caregiver will plan the treatment based on your condition. The different types of treatment are:   Putting in a soft plastic tube (ureteral stent) to connect the bladder with the kidney. This will help in draining the urine.   Putting in a soft tube (nephrostomy tube). This is placed through skin into the kidney. The trapped urine is drained out through the back. A plastic bag is attached to your skin to hold the urine that has drained out.   Antibiotics to treat or prevent infection.   Breaking down of the stone (lithotripsy).  HOME CARE INSTRUCTIONS   It may take some time for the hydronephrosis to go away (resolve). Drink fluids as directed by your caregiver , and get a lot of rest.   If you have a drain in, your caregiver will give you directions about how to care for it. Be sure you understand these directions completely before you go home.   Take any antibiotics, pain medications, or other prescriptions exactly as prescribed.   Follow-up with your caregivers as directed.  SEEK MEDICAL CARE IF:   You continue to have flank pain, nausea, or difficulty with urination.   You have any problem with any type of drainage device.   Your urine becomes cloudy or bloody.  SEEK IMMEDIATE MEDICAL CARE IF:   You have severe flank and/or abdominal pain.   You develop vomiting and are unable to hold down fluids.   You develop a fever above 100.5 F (38.1 C), or as per your caregiver.  MAKE SURE YOU:   Understand these instructions.   Will watch your condition.   Will get help right away if you are not doing well or get worse.  Document Released: 05/30/2007 Document Revised:  07/22/2011 Document Reviewed: 07/16/2010 [facility] Patient Information 2012 [place], [place].  Gallstones Gallstones are a form of gallbladder disease. The gallbladder is a small organ that helps you digest food.  HOME CARE  Only take medicine as told by your doctor.   Eat a low-fat diet.   Follow up as told.  GET HELP RIGHT AWAY IF:   Your pain gets worse.   You develop yellow skin or eyes (jaundice).   The pain moves to another part of your belly (abdomen) or back.   You have a temperature by mouth above 102 F (38.9 C), not controlled by medicine.   You feel sick to your stomach (nauseous)  and throw up (vomit).  MAKE SURE YOU:   Understand these instructions.   Will watch your condition.   Will get help right away if you are not doing well or get worse.  Document Released: 01/19/2008 Document Revised: 07/22/2011 Document Reviewed: 07/08/2009 [facility] Patient Information 2012 [place], [place].

## 2011-11-02 NOTE — ED Notes (Signed)
Returned from [facility]

## 2011-11-02 NOTE — Consult Note (Signed)
[name] [birth year]  [id number].   Primary Care MD: patient can't remember  Requesting MD: Dr. Dierdre Highman Chief Complaint/Reason for Consult: cholelithiasis HPI: This is a 78 yo black female with a history of dementia who can provide some history but not a complete history.  It sounds like she started having some right flank pain 1-2 days ago.  This radiated to the front of her abdomen on the right side.  She c/o a lot of back pain, but is unsure if that's related to her arthritis or not.  She has had some nausea, but no emesis.  She also admits to apparently having some diarrhea for the last 3 months.  She has never had pain like this before.  She admits to only being able to void once yesterday with decrease urinary output.  She came to the emergency department for further workup.  She had a UA that reveals few squamous cells with 21-50 WBCs and many bacteria.  She also had an u/s that reveals some gallstones, but also findings of right sided hydronephrosis.  She has an elevated creatinine of 2.6, with a baseline of around 1.6.  Her LFTs are normal.  We have been asked to see her to rule out cholecystitis.  Review of Systems: Please see HPI, otherwise all other systems are negative.  History reviewed. No pertinent family history.  Past Medical History  Diagnosis Date  . Arthritis   . Hypertension   . Coronary artery disease   . Stroke   . Dementia     Past Surgical History  Procedure Date  . Eye surgery   . Abdominal hysterectomy   . Coronary angioplasty with stent placement     Social History:  reports that she has quit smoking. She does not have any smokeless tobacco history on file. She reports that she does not drink alcohol. Her drug history not on file.  Allergies:  Allergies  Allergen Reactions  . Lactose Intolerance (Gi)     Medications Prior to Admission  Medication Dose Route Frequency Provider Last Rate Last Dose  . 0.9 %  sodium chloride infusion   Intravenous  Continuous Cheri Guppy, MD 125 mL/hr at 11/01/11 1959    . cefTRIAXone (ROCEPHIN) 1 g in dextrose 5 % 50 mL IVPB  1 g Intravenous Once Sunnie Nielsen, MD   1 g at 11/02/11 0711  . ondansetron (ZOFRAN) injection 4 mg  4 mg Intravenous Once Cheri Guppy, MD   4 mg at 11/01/11 2000   No current outpatient prescriptions on file as of 11/01/2011.    Blood pressure 129/73, pulse 73, temperature 97.1 F (36.2 C), temperature source Oral, resp. rate 18, SpO2 98.00%. Physical Exam: General: Pleasant WD, WN elderly female who is laying in bed in NAD HEENT: Head is normocephalic, atraumatic.  Sclera are noninjected. PERRL.  No rhinorrhea.  Mouth is pink. Heart: regular, rate, and rhythm. Normal s1, s2. Nor obvious murmurs, gallops, or rubs noted.  Palpable radial and pedal pulses bilaterally Lungs: CTAB, no wheezes, rhonchi, or rales noted.  Respiratory effort is nonlabored. Abd: soft, minimally tender in RUQ, +BS, ND.  No Murphy's sign noted +CVA tenderness on right side of back.  Lower midline incisional scar noted.  No masses or hernias noted. MS: all 4 extremities are symmetrical with no cyanosis, clubbing, or edema. Skin: warm and dry with no masses, lesions, or rashes Psych: alert and oriented with an appropriate affect.   Results for orders placed during the hospital encounter  of 11/01/11 (from the past 48 hour(s))  CBC     Status: Abnormal   Collection Time   11/01/11  6:13 PM      Component Value Range Comment   WBC 8.9  4.0 - 10.5 (K/uL)    RBC 3.74 (*) 3.87 - 5.11 (MIL/uL)    Hemoglobin 12.5  12.0 - 15.0 (g/dL)    HCT 16.1  09.6 - 04.5 (%)    MCV 97.1  78.0 - 100.0 (fL)    MCH 33.4  26.0 - 34.0 (pg)    MCHC 34.4  30.0 - 36.0 (g/dL)    RDW 40.9  81.1 - 91.4 (%)    Platelets 170  150 - 400 (K/uL)   POCT I-STAT TROPONIN I     Status: Normal   Collection Time   11/01/11  6:31 PM      Component Value Range Comment   Troponin i, poc 0.00  0.00 - 0.08 (ng/mL)    Comment 3             POCT I-STAT, CHEM 8     Status: Abnormal   Collection Time   11/01/11  6:33 PM      Component Value Range Comment   Sodium 143  135 - 145 (mEq/L)    Potassium 5.3 (*) 3.5 - 5.1 (mEq/L)    Chloride 111  96 - 112 (mEq/L)    BUN 31 (*) 6 - 23 (mg/dL)    Creatinine, Ser 7.82 (*) 0.50 - 1.10 (mg/dL)    Glucose, Bld 956 (*) 70 - 99 (mg/dL)    Calcium, Ion 2.13  1.12 - 1.32 (mmol/L)    TCO2 24  0 - 100 (mmol/L)    Hemoglobin 13.3  12.0 - 15.0 (g/dL)    HCT 08.6  57.8 - 46.9 (%)   COMPREHENSIVE METABOLIC PANEL     Status: Abnormal   Collection Time   11/01/11  7:45 PM      Component Value Range Comment   Sodium 141  135 - 145 (mEq/L)    Potassium 4.9  3.5 - 5.1 (mEq/L)    Chloride 103  96 - 112 (mEq/L)    CO2 23  19 - 32 (mEq/L)    Glucose, Bld 117 (*) 70 - 99 (mg/dL)    BUN 27 (*) 6 - 23 (mg/dL)    Creatinine, Ser 6.29 (*) 0.50 - 1.10 (mg/dL)    Calcium 9.8  8.4 - 10.5 (mg/dL)    Total Protein 8.8 (*) 6.0 - 8.3 (g/dL)    Albumin 3.2 (*) 3.5 - 5.2 (g/dL)    AST 17  0 - 37 (U/L)    ALT 12  0 - 35 (U/L)    Alkaline Phosphatase 58  39 - 117 (U/L)    Total Bilirubin 0.3  0.3 - 1.2 (mg/dL)    GFR calc non Af Amer 17 (*) >90 (mL/min)    GFR calc Af Amer 19 (*) >90 (mL/min)   URINALYSIS, ROUTINE W REFLEX MICROSCOPIC     Status: Abnormal   Collection Time   11/02/11 12:45 AM      Component Value Range Comment   Color, Urine YELLOW  YELLOW     APPearance CLOUDY (*) CLEAR     Specific Gravity, Urine 1.012  1.005 - 1.030     pH 6.0  5.0 - 8.0     Glucose, UA NEGATIVE  NEGATIVE (mg/dL)    Hgb urine dipstick NEGATIVE  NEGATIVE  Bilirubin Urine NEGATIVE  NEGATIVE     Ketones, ur NEGATIVE  NEGATIVE (mg/dL)    Protein, ur 161 (*) NEGATIVE (mg/dL)    Urobilinogen, UA 0.2  0.0 - 1.0 (mg/dL)    Nitrite NEGATIVE  NEGATIVE     Leukocytes, UA SMALL (*) NEGATIVE    URINE MICROSCOPIC-ADD ON     Status: Abnormal   Collection Time   11/02/11 12:45 AM      Component Value Range Comment    Squamous Epithelial / LPF RARE  RARE     WBC, UA 21-50  <3 (WBC/hpf)    RBC / HPF 0-2  <3 (RBC/hpf)    Bacteria, UA MANY (*) RARE     Dg Chest 2 View  11/01/2011  *RADIOLOGY REPORT*  Clinical Data: Right flank pain and pain under left breast. Weakness.  Chills.  CHEST - 2 VIEW  Comparison: 08/24/2010  Findings:  The heart size and mediastinal contours are within normal limits.  Both lungs are clear.  The visualized skeletal structures are unremarkable. Previous right pleural effusion has resolved.  Compared with priors there is improved aeration.  IMPRESSION: No active cardiopulmonary disease.  Original Report Authenticated By: Elsie Stain, M.D.   US Abdomen Complete  11/02/2011  *RADIOLOGY REPORT*  Clinical Data:  Right-sided back pain and right upper quadrant abdominal pain.  ABDOMINAL ULTRASOUND COMPLETE  Comparison:  None  Findings:  Gallbladder:  A 1.4 cm stone is noted dependently within the gallbladder.  There is minimal gallbladder wall thickening, measuring up to 3 mm.  A positive ultrasonographic Murphy's sign is elicited.  This raises question for mild cholecystitis.  Common Bile Duct:  0.5 cm in diameter; within normal limits in caliber.  Liver:  Normal parenchymal echogenicity and echotexture; no focal lesions identified.  Limited Doppler evaluation demonstrates normal blood flow within the liver.  IVC:  Unremarkable in appearance.  Pancreas:  Not visualized due to overlying structures.  Spleen:  6.5 cm in length; within normal limits in size and echotexture.  Right kidney:  9.1 cm in length; normal in size and configuration, with suggestion of mildly increased parenchymal echogenicity.  No mass is seen; there is suggestion of mild hydronephrosis.  A right- sided extrarenal pelvis is seen.  Left kidney:  9.7 cm in length; normal in size and configuration. There is suggestion of mildly increased parenchymal echogenicity. No evidence of mass or hydronephrosis.  Abdominal Aorta:  Normal in  caliber; no aneurysm identified.  IMPRESSION:  1.  Minimal gallbladder wall thickening, with a positive ultrasonographic Murphy's sign and underlying cholelithiasis. Findings raise question for mild cholecystitis.  No evidence for obstruction. 2.  Suggestion of increased renal echogenicity bilaterally, which may reflect medical renal disease. 3.  Question of mild right-sided hydronephrosis; suggest clinical correlation for associated symptoms.  Original Report Authenticated By: Tonia Ghent, M.D.       Assessment/Plan 1. Right flank pain, hydronephrosis with UTI vs cholecystitis 2. Cholelithiasis 3. HTN 4. H/o CVA 5. CKD with ARF 6. Dementia  Plan: 1. Given the patient's UA findings and u/s findings, I suspect her pain is more likely related to her UTI and hydronephrosis.  However, she does have some gallstones.  We will obtain a HIDA scan to rule out cholecystitis.  In the meantime, I agree with a medicine admission to address her ARF, decrease urinary output, hydronephrosis, and UTI.  I doubt the patient has cholecystitis, but we will follow along and await the HIDA scan results.  Rylin Saez E  11/02/2011, 7:47 AM  Addendum: The patient's HIDA scan is normal with normal uptake of the gallbladder without evidence for acute cholecystitis.  Will d/w MD, but suspect pain is likely secondary to her current hydronephrosis and urinary problems.  She does have gallstones, but do not feel that the patient needs surgical intervention at this time secondary to this finding.   Siani Utke E 11/02/2011 11:21 AM

## 2011-11-02 NOTE — ED Notes (Signed)
Pt to go to [facility] b/t [phone]

## 2011-11-02 NOTE — ED Notes (Signed)
Patient care assumed from Dr. Patria Mane. Foley placed and about 400 cc of urine output appreciated. UA reviewed and does have 21-50 white blood cells with bacteria. IV Rocephin initiated and urine culture sent. Ultrasound report reviewed and noted positive Murphy sign and query mild cholecystitis. Normal LFTs. Patient evaluated at 6:20 AM. No significant abdominal tenderness. Negative Murphy's sign at this time. Patient denies any discomfort. General surgery consulted given ultrasound findings. Blood pressure 129/73 with pulse rate 73. Afebrile in the emergency department. Case discussed with Dr. Carolynne Edouard and general surgery will evaluate in the ED.  Patient care transferred to Dr. Preston Fleeting 7 AM.  Sunnie Nielsen, MD 11/02/11 [phone]

## 2011-11-02 NOTE — ED Provider Notes (Signed)
8:23 AM Patient is in CDU holding for surgical consult this morning.  Sign out received from Dr Dierdre Highman. Patient presented with right flank pain, was found to have UTI, renal insufficiency, mild right hydronephrosis.  By history, patient urinated only once yesterday, foley was placed with 400cc output.  Pt also with mild tenderness of RUQ.  On exam, pt is A&Ox4, NAD, RRR, CTAB, abdomen is soft, nondistended, RUQ mildly tender to palpation, no guarding, no rebound, no CVA tenderness.  US showed minimal gallbladder wall thickening with positive [place] muphy's sign and cholelithiasis.  Patient has been seen by Barnetta Chapel, PA-C, of [facility], who has ordered a HIDA scan.    11:29 AM Discussed patient with Dr Preston Fleeting.  HIDA scan is negative, per surgery, patient does not need any surgical intervention at this point for her gallbladder.  Plan as discussed with Dr Preston Fleeting is to discontinue foley while in the ED, d/c home with antibiotics for UTI.  Patient to follow up with PCP.  Son is now bedside.  Patient lives with son - he states he agrees with discharge home and will be able to take care of her and give her antibiotics.  He states she is alert and oriented, acting like her normal self currently.  States he brought her in because she was a little altered last night, which happens he says when she gets urinary tract infections.  Pt d/c home with close PCP follow up.  Also given urology and surgery follow up.  Pt and son verbalize understanding and agree with plan.    Rise Patience, [place] 11/02/11 1605

## 2011-11-02 NOTE — ED Notes (Signed)
Pt wanting something for head and back pain.

## 2011-11-03 LAB — URINE CULTURE
Colony Count: >=100000
Culture  Setup Time: 201303180102

## 2011-11-04 NOTE — ED Notes (Signed)
+   Urine. Patient treated with keflex. Sensitive to same. Per protocol MD.

## 2012-01-18 ENCOUNTER — Ambulatory Visit (INDEPENDENT_AMBULATORY_CARE_PROVIDER_SITE_OTHER): Payer: Medicare Other | Admitting: Surgery

## 2012-01-18 ENCOUNTER — Encounter (INDEPENDENT_AMBULATORY_CARE_PROVIDER_SITE_OTHER): Payer: Self-pay | Admitting: Surgery

## 2012-01-18 VITALS — BP 98/66 | HR 58 | Temp 97.9°F | Resp 14 | Ht 61.0 in | Wt 148.0 lb

## 2012-01-18 DIAGNOSIS — K8020 Calculus of gallbladder without cholecystitis without obstruction: Secondary | ICD-10-CM

## 2012-01-18 NOTE — Patient Instructions (Signed)
If you start having abdominal pain or problems eating we will see you again to see if you need to have surgery to remove your gallbladder. For now no surgery is needed

## 2012-01-18 NOTE — Progress Notes (Signed)
NAMERHEALYNN Camacho Camacho DOB: 1930-10-04 MRN: [id number]                                                                                      DATE: 01/18/2012  PCP: [name], MD, MD Referring Provider: Marcine Matar, MD  IMPRESSION:  Gallstone, asymptomatic  PLAN:   Surgery only if symptoms develop . Discussed with patient and family                 CC:  Chief Complaint  Patient presents with  . Cholelithiasis    New Pt. Eval GB    HPI:  [name] is a 78 y.o.  female who presents for evaluation of gallstone.She iwas admitted to in March to the hospital with some flank pain. A gallstone was noticed and it surgical consultation obtained. However, her symptoms appear to be from her urinary tract infection and that resolved. A hepatobiliary scan was done which showed no evidence of acute cholecystitis and no surgical intervention was recommended. She was seen recently for urological followup and general surgery follow up was recommended at that time.  The patient has no problems with eating any food or fatty foods. She has no vomiting nausea indigestion change in her bowel habits or abdominal pain.   PMH:  has a past medical history of Arthritis; Hypertension; Coronary artery disease; Stroke; Dementia; and Seizures.  PSH:   has past surgical history that includes Eye surgery; Abdominal hysterectomy; and Coronary angioplasty with stent.  ALLERGIES:   Allergies  Allergen Reactions  . Lactose Intolerance (Gi)     MEDICATIONS: Current outpatient prescriptions:Azelastine HCl (ASTEPRO) 0.15 % SOLN, Place 2 sprays into the nose 2 (two) times daily., Disp: , Rfl: ;  Besifloxacin HCl (BESIVANCE) 0.6 % SUSP, Apply 1 drop to eye 2 (two) times daily., Disp: , Rfl: ;  bimatoprost (LUMIGAN) 0.03 % ophthalmic solution, Place 1 drop into the left eye at bedtime., Disp: , Rfl:  brimonidine-timolol (COMBIGAN) 0.2-0.5 % ophthalmic solution, Place 1 drop into the left eye every 12 (twelve)  hours., Disp: , Rfl: ;  brinzolamide (AZOPT) 1 % ophthalmic suspension, Place 1 drop into the right eye 3 (three) times daily., Disp: , Rfl: ;  bromfenac (XIBROM) 0.09 % ophthalmic solution, Place 1 drop into the right eye 2 (two) times daily., Disp: , Rfl:  Calcium Carbonate-Vitamin D (CALCIUM 500/D) 500-125 MG-UNIT TABS, Take 1 tablet by mouth 2 (two) times daily., Disp: , Rfl: ;  diclofenac (FLECTOR) 1.3 % PTCH, Place 1 patch onto the skin every 12 (twelve) hours., Disp: , Rfl: ;  diclofenac sodium (VOLTAREN) 1 % GEL, Apply 1 application topically 2 (two) times daily as needed. For pain, Disp: , Rfl: ;  escitalopram (LEXAPRO) 5 MG tablet, Take 2.5 mg by mouth daily., Disp: , Rfl:  ezetimibe (ZETIA) 10 MG tablet, Take 10 mg by mouth daily., Disp: , Rfl: ;  fexofenadine (ALLEGRA) 180 MG tablet, Take 180 mg by mouth daily as needed. For allergies, Disp: , Rfl: ;  labetalol (NORMODYNE) 200 MG tablet, Take 200 mg by mouth daily., Disp: , Rfl: ;  levETIRAcetam (KEPPRA) 250 MG tablet, Take 250  mg by mouth every 12 (twelve) hours., Disp: , Rfl: ;  levocetirizine (XYZAL) 5 MG tablet, Take 5 mg by mouth every evening., Disp: , Rfl:  mirtazapine (REMERON) 15 MG tablet, Take 15 mg by mouth at bedtime., Disp: , Rfl: ;  traMADol (ULTRAM) 50 MG tablet, Take 50 mg by mouth daily as needed. For pain, Disp: , Rfl:   ROS: She has filled out our 12 point review of systems and it is negative   EXAM:   VITAL SIGNS: BP 98/66  Pulse 58  Temp(Src) 97.9 F (36.6 C) (Temporal)  Resp 14  Ht 5\' 1"  (1.549 m)  Wt 148 lb (67.132 kg)  BMI 27.96 kg/m2 GENERAL:  The patient is alert, oriented, and generally healthy-appearing, NAD. Mood and affect are normal.  HEENT:  The head is normocephalic, the eyes nonicteric, the pupils were round regular and equal. EOMs are normal. Pharynx normal. Dentition good.  NECK:  The neck is supple and there are no masses or thyromegaly.  LUNGS: Normal respirations and clear to  auscultation.  HEART: Regular rhythm, with no murmurs rubs or gallops. Pulses are intact carotid dorsalis pedis and posterior tibial. No significant varicosities are noted.    ABDOMEN: Soft, flat, and nontender. No masses or organomegaly is noted. No hernias are noted. Bowel sounds are normal.Well healed lover midline surgical scar  EXTREMITIES:  Good range of motion, no edema.   DATA REVIEWED:  Notes from urology and records in Epic    [name] 01/18/2012  CC: Marcine Matar, MD, [name], MD, MD

## 2012-01-28 ENCOUNTER — Other Ambulatory Visit: Payer: Self-pay | Admitting: Nephrology

## 2012-01-28 DIAGNOSIS — N179 Acute kidney failure, unspecified: Secondary | ICD-10-CM

## 2012-02-01 ENCOUNTER — Ambulatory Visit
Admission: RE | Admit: 2012-02-01 | Discharge: 2012-02-01 | Disposition: A | Discharge status: Home / Self Care | Payer: Medicare Other | Source: Ambulatory Visit | Attending: Nephrology | Admitting: Nephrology

## 2012-02-01 DIAGNOSIS — N179 Acute kidney failure, unspecified: Secondary | ICD-10-CM

## 2012-02-03 ENCOUNTER — Telehealth: Payer: Self-pay | Admitting: Oncology

## 2012-02-03 NOTE — Telephone Encounter (Signed)
S/w the pt's husband and he is aware of the new pt appt with dr Darnelle Catalan on 02/11/2012@10 :30am

## 2012-02-04 ENCOUNTER — Telehealth: Payer: Self-pay | Admitting: Oncology

## 2012-02-04 NOTE — Telephone Encounter (Signed)
Referred by Dr. Terrial Rhodes, [facility]

## 2012-02-10 ENCOUNTER — Other Ambulatory Visit: Payer: Self-pay | Admitting: *Deleted

## 2012-02-11 ENCOUNTER — Telehealth: Payer: Self-pay | Admitting: *Deleted

## 2012-02-11 ENCOUNTER — Ambulatory Visit: Payer: Medicare Other

## 2012-02-11 ENCOUNTER — Encounter: Payer: Self-pay | Admitting: Oncology

## 2012-02-11 ENCOUNTER — Ambulatory Visit (HOSPITAL_BASED_OUTPATIENT_CLINIC_OR_DEPARTMENT_OTHER): Payer: Medicare Other | Admitting: Oncology

## 2012-02-11 ENCOUNTER — Other Ambulatory Visit (HOSPITAL_BASED_OUTPATIENT_CLINIC_OR_DEPARTMENT_OTHER): Payer: Medicare Other | Admitting: Lab

## 2012-02-11 ENCOUNTER — Ambulatory Visit (HOSPITAL_COMMUNITY)
Admission: RE | Admit: 2012-02-11 | Discharge: 2012-02-11 | Disposition: A | Discharge status: Home / Self Care | Payer: Medicare Other | Source: Ambulatory Visit | Attending: Oncology | Admitting: Oncology

## 2012-02-11 VITALS — BP 98/63 | HR 75 | Temp 95.4°F | Ht 61.0 in | Wt 143.5 lb

## 2012-02-11 DIAGNOSIS — X58XXXA Exposure to other specified factors, initial encounter: Secondary | ICD-10-CM | POA: Insufficient documentation

## 2012-02-11 DIAGNOSIS — D472 Monoclonal gammopathy: Secondary | ICD-10-CM

## 2012-02-11 DIAGNOSIS — H269 Unspecified cataract: Secondary | ICD-10-CM | POA: Insufficient documentation

## 2012-02-11 DIAGNOSIS — M1710 Unilateral primary osteoarthritis, unspecified knee: Secondary | ICD-10-CM | POA: Insufficient documentation

## 2012-02-11 DIAGNOSIS — H409 Unspecified glaucoma: Secondary | ICD-10-CM

## 2012-02-11 DIAGNOSIS — N183 Chronic kidney disease, stage 3 unspecified: Secondary | ICD-10-CM

## 2012-02-11 DIAGNOSIS — N179 Acute kidney failure, unspecified: Secondary | ICD-10-CM

## 2012-02-11 DIAGNOSIS — S22009A Unspecified fracture of unspecified thoracic vertebra, initial encounter for closed fracture: Secondary | ICD-10-CM | POA: Insufficient documentation

## 2012-02-11 DIAGNOSIS — K8020 Calculus of gallbladder without cholecystitis without obstruction: Secondary | ICD-10-CM

## 2012-02-11 DIAGNOSIS — M47812 Spondylosis without myelopathy or radiculopathy, cervical region: Secondary | ICD-10-CM | POA: Insufficient documentation

## 2012-02-11 DIAGNOSIS — M4310 Spondylolisthesis, site unspecified: Secondary | ICD-10-CM | POA: Insufficient documentation

## 2012-02-11 DIAGNOSIS — E785 Hyperlipidemia, unspecified: Secondary | ICD-10-CM

## 2012-02-11 DIAGNOSIS — Z8739 Personal history of other diseases of the musculoskeletal system and connective tissue: Secondary | ICD-10-CM

## 2012-02-11 NOTE — Progress Notes (Signed)
ID: [name]   DOB: 06-Aug-1931  MR#: [id number]  CSN#:[id number]  HISTORY OF PRESENT ILLNESS: [name] has a history of repeated urinary tract infections, and more recently of renal failure. She was referred to Dr. Arrie Aran for further evaluation and as part of the workup he obtained a C-met 01/14/2012 which showed a total protein of 8.3, with an albumin of 4.3. Quantitative immunoglobulins showed a normal IgG at 900, normal IgM at 149, but an elevated IgA at 2248. Immunofixation confirmed an IgA monoclonal protein with lambda light chain restriction. Free kappa light chains were 40.42, free lambda light chains 310.20. Other labs of interest included a creatinine of 2.35, a normal CBC including a hemoglobin of 13.4 with MCV of 95 and a negative SPEP on 01/18/2012, with M spike not observed. The patient was referred for further evaluation. Her subsequent history is as detailed below.  INTERVAL HISTORY: [name] home is being seen today at the hematology clinic together with her son [name]. There is a question regarding possible dementia versus delirium of and it appears during her most recent hospitalization, with an active urinary tract infection, she was very confused. That was not apparent today.  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 She spends most of the day in bed. She is able to slide out of bed to a rocking chair, and she also has a wheelchair at home. She is short of breath most of the time, but doesn't have significant problems with cough, phlegm production, pleurisy, or hemoptysis. She has chronic back and joint pain, which makes it very hard for her to walk. She tells me she is still having hot flashes. She denies any rash, drenching night sweats, fevers, bleeding or unexplained weight loss or fatigue. She has a very limited functional status, but it is stable. A detailed review of systems was  otherwise noncontributory  PAST MEDICAL HISTORY: Past Medical History  Diagnosis Date  . Arthritis   . Hypertension   . Coronary artery disease   . Stroke   . Dementia   . Seizures     due to hyponatremia, resolved  . History of gout   . Glaucoma   . Cataracts, bilateral   . Acute kidney injury   . CKD (chronic kidney disease), stage III   . Hyperlipidemia   . Gallstones     PAST SURGICAL HISTORY: Past Surgical History  Procedure Date  . Eye surgery   . Abdominal hysterectomy   . Coronary angioplasty with stent placement     FAMILY HISTORY No family history on file. The patient's parents both died in their early 33s from "natural causes". The patient is one of 12 siblings. One brother had prostate cancer. There is no family history of myeloma or other "white cell" cancers to her knowledge.  GYNECOLOGIC HISTORY: She is GX P. 10, first live birth age 78. The patient does not recall when she went through the change of life. She did not take hormone replacement.  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 She has been a widow more than 20 years. At home she lives with her son [name], who is disabled secondary to back problems. He does all the shopping, cleaning, cooking, etc. Most of the other children live in this area. The patient has too many grandchildren and great-grandchildren to count. She belongs to the [place]  church but does not attend because of her physical disabilities.  ADVANCED DIRECTIVES:  HEALTH MAINTENANCE: History  Substance Use Topics  . Smoking status: Former Games developer  . Smokeless tobacco: Never Used  . Alcohol Use: No     Colonoscopy: Never  PAP: Does not recall having one for many years  Bone density: Never  Mammogram: Not for many years  Allergies  Allergen Reactions  . Lactose Intolerance (Gi)     Current Outpatient Prescriptions  Medication Sig Dispense Refill  . Azelastine HCl (ASTEPRO) 0.15 % SOLN Place 2  sprays into the nose 2 (two) times daily.      Marland Kitchen Besifloxacin HCl (BESIVANCE) 0.6 % SUSP Apply 1 drop to eye 2 (two) times daily.      . bimatoprost (LUMIGAN) 0.03 % ophthalmic solution Place 1 drop into the left eye at bedtime.      . brimonidine-timolol (COMBIGAN) 0.2-0.5 % ophthalmic solution Place 1 drop into the left eye every 12 (twelve) hours.      . brinzolamide (AZOPT) 1 % ophthalmic suspension Place 1 drop into the right eye 3 (three) times daily.      . bromfenac (XIBROM) 0.09 % ophthalmic solution Place 1 drop into the right eye 2 (two) times daily.      . Calcium Carbonate-Vitamin D (CALCIUM 500/D) 500-125 MG-UNIT TABS Take 1 tablet by mouth 2 (two) times daily.      . diclofenac (FLECTOR) 1.3 % PTCH Place 1 patch onto the skin every 12 (twelve) hours.      . diclofenac sodium (VOLTAREN) 1 % GEL Apply 1 application topically 2 (two) times daily as needed. For pain      . escitalopram (LEXAPRO) 5 MG tablet Take 2.5 mg by mouth daily.      Marland Kitchen ezetimibe (ZETIA) 10 MG tablet Take 10 mg by mouth daily.      . fexofenadine (ALLEGRA) 180 MG tablet Take 180 mg by mouth daily as needed. For allergies      . labetalol (NORMODYNE) 200 MG tablet Take 200 mg by mouth daily.      Marland Kitchen levETIRAcetam (KEPPRA) 250 MG tablet Take 250 mg by mouth every 12 (twelve) hours.      Marland Kitchen levocetirizine (XYZAL) 5 MG tablet Take 5 mg by mouth every evening.      . mirtazapine (REMERON) 15 MG tablet Take 15 mg by mouth at bedtime.      . traMADol (ULTRAM) 50 MG tablet Take 50 mg by mouth daily as needed. For pain        OBJECTIVE: Elderly African American woman examined in a wheelchair Filed Vitals:   02/11/12 1111  BP: 98/63  Pulse: 75  Temp: 95.4 F (35.2 C)     Body mass index is 27.11 kg/(m^2).    ECOG FS: 3  Sclerae unicteric Oropharynx clear No cervical or supraclavicular adenopathy Lungs no rales or rhonchi Heart regular rate and rhythm Abd benign MSK no focal spinal tenderness Neuro: nonfocal;  able to give me a call here in medical and family history Breasts: Breasts are pendulous, with no masses palpated, no skin or nipple changes of concern  LAB RESULTS: Lab Results  Component Value Date   WBC 8.9 11/01/2011   NEUTROABS 6.6 02/13/2010   HGB 13.3 11/01/2011   HCT 39.0 11/01/2011   MCV 97.1 11/01/2011   PLT 170 11/01/2011      Chemistry      Component Value Date/Time   NA 141 11/01/2011 1945  K 4.9 11/01/2011 1945   CL 103 11/01/2011 1945   CO2 23 11/01/2011 1945   BUN 27* 11/01/2011 1945   CREATININE 2.57* 11/01/2011 1945      Component Value Date/Time   CALCIUM 9.8 11/01/2011 1945   ALKPHOS 58 11/01/2011 1945   AST 17 11/01/2011 1945   ALT 12 11/01/2011 1945   BILITOT 0.3 11/01/2011 1945       No results found for this basename: LABCA2    No components found with this basename: LABCA125    No results found for this basename: INR:1;PROTIME:1 in the last 168 hours  Urinalysis    Component Value Date/Time   COLORURINE YELLOW 11/02/2011 0045   APPEARANCEUR CLOUDY* 11/02/2011 0045   LABSPEC 1.012 11/02/2011 0045   PHURINE 6.0 11/02/2011 0045   GLUCOSEU NEGATIVE 11/02/2011 0045   HGBUR NEGATIVE 11/02/2011 0045   BILIRUBINUR NEGATIVE 11/02/2011 0045   KETONESUR NEGATIVE 11/02/2011 0045   PROTEINUR 100* 11/02/2011 0045   UROBILINOGEN 0.2 11/02/2011 0045   NITRITE NEGATIVE 11/02/2011 0045   LEUKOCYTESUR SMALL* 11/02/2011 0045    STUDIES: US Renal  02/01/2012  *RADIOLOGY REPORT*  Clinical Data: Acute renal failure, history of right hydronephrosis  RENAL/URINARY TRACT ULTRASOUND COMPLETE  Comparison:  Ultrasound of the abdomen of 11/02/2011  Findings:  Right Kidney:  No hydronephrosis is currently seen.  The right kidney measures 9.2 cm sagittally.  The echogenicity of the parenchyma is increased consistent with chronic renal medical disease.  Left Kidney:  No hydronephrosis is seen.  The left kidney measures 9.3 cm and also demonstrates increased echogenicity of the parenchyma.   Bladder:  The urinary bladder is unremarkable.  Incidental gallstones are noted within the gallbladder.  IMPRESSION:  1.  No hydronephrosis. 2.  Echogenic renal parenchyma bilaterally consistent with chronic renal medical disease. 3.  Incidental gallstones.  Original Report Authenticated By: Juline Patch, M.D.    ASSESSMENT: 79 y.o. [place] woman, with an IgA lambda monoclonal gammopathy not noted on SPEP 01/17/2012, total IgA at baseline 2248, with normal IgG and IgM levels, total lambda light chains 310.2, with 40.42.  PLAN: It is remarkable that the patient has no anemia given her renal problems and other abnormalities suggestive of autoimmune disease. We discussed the meaning of her IgA monoclonal gammopathy, and she understands in many cases this requires only followup. We are obtaining a bone survey today. If there are no lytic lesions noted, we will not proceed to bone marrow biopsy, but initiate observation. In that case her next lab work and visit here will be in approximately 3 months.  Ms. Ellzey tells me with a chuckle that she was told last year she needed to choose a place for her burial. She tells me she was not born on Christmas day for nothing. Our plan for now is simply to monitor her monoclonal gammopathy, and a stable 3 months from now we will initiate labs every 3 months with visits once a year. She knows to call for problems that may be related to this issue.   Wilver Tignor C    02/11/2012

## 2012-02-11 NOTE — Telephone Encounter (Signed)
Gave patient appointment for 06-05-2012 starting at 12:45pm printed out calendar and gave to the patient

## 2012-02-15 ENCOUNTER — Other Ambulatory Visit: Payer: Self-pay | Admitting: *Deleted

## 2012-02-15 LAB — SPEP & IFE WITH QIG
Alpha-1-Globulin: 3.2 % (ref 2.9–4.9)
Alpha-2-Globulin: 10.4 % (ref 7.1–11.8)
Beta 2: 22.6 % — ABNORMAL HIGH (ref 3.2–6.5)
Gamma Globulin: 9.3 % — ABNORMAL LOW (ref 11.1–18.8)
IgG (Immunoglobin G), Serum: 870 mg/dL (ref 690–1700)
IgM, Serum: 89 mg/dL (ref 52–322)
M-Spike, %: 1.38 g/dL

## 2012-02-15 LAB — KAPPA/LAMBDA LIGHT CHAINS
Kappa:Lambda Ratio: 0.2 — ABNORMAL LOW (ref 0.26–1.65)
Lambda Free Lght Chn: 19.6 mg/dL — ABNORMAL HIGH (ref 0.57–2.63)

## 2012-02-16 ENCOUNTER — Other Ambulatory Visit: Payer: Self-pay | Admitting: Oncology

## 2012-02-24 LAB — UIFE/LIGHT CHAINS/TP QN, 24-HR UR
Albumin, U: DETECTED
Alpha 2, Urine: DETECTED — AB
Beta, Urine: DETECTED — AB
Gamma Globulin, Urine: DETECTED — AB
Total Protein, Urine-Ur/day: 255 mg/d — ABNORMAL HIGH (ref 10–140)

## 2012-05-16 DIAGNOSIS — I469 Cardiac arrest, cause unspecified: Secondary | ICD-10-CM

## 2012-05-16 HISTORY — DX: Cardiac arrest, cause unspecified: I46.9

## 2012-05-20 ENCOUNTER — Emergency Department (HOSPITAL_COMMUNITY)
Admission: EM | Admit: 2012-05-20 | Discharge: 2012-05-21 | Disposition: A | Discharge status: Home / Self Care | Payer: Medicare Other | Attending: Emergency Medicine | Admitting: Emergency Medicine

## 2012-05-20 ENCOUNTER — Encounter (HOSPITAL_COMMUNITY): Payer: Self-pay | Admitting: Emergency Medicine

## 2012-05-20 DIAGNOSIS — N183 Chronic kidney disease, stage 3 unspecified: Secondary | ICD-10-CM | POA: Insufficient documentation

## 2012-05-20 DIAGNOSIS — Z8673 Personal history of transient ischemic attack (TIA), and cerebral infarction without residual deficits: Secondary | ICD-10-CM | POA: Insufficient documentation

## 2012-05-20 DIAGNOSIS — N390 Urinary tract infection, site not specified: Secondary | ICD-10-CM | POA: Insufficient documentation

## 2012-05-20 DIAGNOSIS — I129 Hypertensive chronic kidney disease with stage 1 through stage 4 chronic kidney disease, or unspecified chronic kidney disease: Secondary | ICD-10-CM | POA: Insufficient documentation

## 2012-05-20 DIAGNOSIS — R5381 Other malaise: Secondary | ICD-10-CM | POA: Insufficient documentation

## 2012-05-20 DIAGNOSIS — I2510 Atherosclerotic heart disease of native coronary artery without angina pectoris: Secondary | ICD-10-CM | POA: Insufficient documentation

## 2012-05-20 LAB — BASIC METABOLIC PANEL
CO2: 23 mEq/L (ref 19–32)
Chloride: 99 mEq/L (ref 96–112)
Creatinine, Ser: 2.24 mg/dL — ABNORMAL HIGH (ref 0.50–1.10)
GFR calc Af Amer: 23 mL/min — ABNORMAL LOW (ref >90)
Potassium: 6.5 mEq/L (ref 3.5–5.1)

## 2012-05-20 LAB — CBC WITH DIFFERENTIAL/PLATELET
Basophils Absolute: 0 10*3/uL (ref 0.0–0.1)
Basophils Relative: 0 % (ref 0–1)
HCT: 41.8 % (ref 36.0–46.0)
Hemoglobin: 14.5 g/dL (ref 12.0–15.0)
Lymphocytes Relative: 18 % (ref 12–46)
MCHC: 34.7 g/dL (ref 30.0–36.0)
Monocytes Absolute: 0.3 10*3/uL (ref 0.1–1.0)
Neutro Abs: 8.9 10*3/uL — ABNORMAL HIGH (ref 1.7–7.7)
Neutrophils Relative %: 79 % — ABNORMAL HIGH (ref 43–77)
RDW: 14.3 % (ref 11.5–15.5)
WBC: 11.4 10*3/uL — ABNORMAL HIGH (ref 4.0–10.5)

## 2012-05-20 LAB — URINE MICROSCOPIC-ADD ON

## 2012-05-20 LAB — URINALYSIS, ROUTINE W REFLEX MICROSCOPIC
Glucose, UA: NEGATIVE mg/dL
Nitrite: NEGATIVE
Specific Gravity, Urine: 1.014 (ref 1.005–1.030)
pH: 5 (ref 5.0–8.0)

## 2012-05-20 LAB — TROPONIN I: Troponin I: <0.3 ng/mL (ref <0.30)

## 2012-05-20 MED ORDER — SODIUM CHLORIDE 0.9 % IV BOLUS (SEPSIS)
250.0000 mL | Freq: Once | INTRAVENOUS | Status: AC
  Administered 2012-05-20: 250 mL via INTRAVENOUS

## 2012-05-20 NOTE — ED Notes (Signed)
Pt reports she has been having some chest tightness since 8pm and had it also yesterday.  Denies SOB, but did become diaphoretic.

## 2012-05-20 NOTE — ED Notes (Signed)
Bed:WHALA<BR> Expected date:<BR> Expected time:<BR> Means of arrival:<BR> Comments:<BR> Harl Favor, Medic, F, AMS, recent UTI

## 2012-05-20 NOTE — ED Provider Notes (Signed)
History     CSN: [id number]  Arrival date & time 05/20/12  2128   First MD Initiated Contact with Patient 05/20/12 2203      Chief Complaint  Patient presents with  . Weakness    (Consider location/radiation/quality/duration/timing/severity/associated sxs/prior treatment) HPI Comments: Patient was brought in by son today, when she started complaining of chest discomfort, nausea, and diaphoresis.  Per report, she was diagnosed I her primary care provider with a UTI.  Several days ago, was supposed to start on antibiotics, which were not started until tonight.  Patient states she has been eating well, has no dysuria.  No constipation, or diarrhea.  Just feels generally weak, and is concerned about her chest hurting she's afraid she may be having a heart attack.  She denies any recent trips or falls.  She walks around her house with a cane, but rarely leaves, unless she has a doctor's appointment She does also state that when she can becomes anxious or upset.  She develops chest pain, and her physician is aware of this.  She became upset and anxious.  Because she could not find her son, who left the home for errands, she was unsure when he would return her how long he had been gone  Patient is a 78 y.o. female presenting with weakness. The history is provided by the patient and a caregiver.  Weakness The primary symptoms include nausea. Primary symptoms do not include headaches, syncope, dizziness, paresthesias, fever or vomiting. The symptoms began 6 to 12 hours ago.  Additional symptoms include weakness.    Past Medical History  Diagnosis Date  . Arthritis   . Hypertension   . Coronary artery disease   . Stroke   . Seizures     due to hyponatremia, resolved  . History of gout   . Glaucoma   . Cataracts, bilateral   . Acute kidney injury   . CKD (chronic kidney disease), stage III   . Hyperlipidemia   . Gallstones     Past Surgical History  Procedure Date  . Eye surgery   .  Abdominal hysterectomy   . Coronary angioplasty with stent placement     No family history on file.  History  Substance Use Topics  . Smoking status: Former Games developer  . Smokeless tobacco: Never Used  . Alcohol Use: No    OB History    Grav Para Term Preterm Abortions TAB SAB Ect Mult Living                  Review of Systems  Constitutional: Negative for fever and chills.  Cardiovascular: Positive for chest pain. Negative for leg swelling and syncope.  Gastrointestinal: Positive for nausea. Negative for vomiting.  Genitourinary: Negative for dysuria and frequency.  Neurological: Positive for weakness. Negative for dizziness, headaches and paresthesias.    Allergies  Lactose intolerance (gi)  Home Medications   Current Outpatient Rx  Name Route Sig Dispense Refill  . BRIMONIDINE TARTRATE-TIMOLOL 0.2-0.5 % OP SOLN Left Eye Place 1 drop into the left eye every 12 (twelve) hours.    Marland Kitchen BRINZOLAMIDE 1 % OP SUSP Right Eye Place 1 drop into the right eye 3 (three) times daily.    Marland Kitchen CALCIUM CARBONATE-VITAMIN D 500-125 MG-UNIT PO TABS Oral Take 1 tablet by mouth 2 (two) times daily.    . CEPHALEXIN 500 MG PO CAPS Oral Take 500 mg by mouth 2 (two) times daily.    Marland Kitchen DICLOFENAC SODIUM 1 % TD  GEL Topical Apply 1 application topically 2 (two) times daily as needed. For pain    . ESCITALOPRAM OXALATE 5 MG PO TABS Oral Take 2.5 mg by mouth daily.    Marland Kitchen EZETIMIBE 10 MG PO TABS Oral Take 10 mg by mouth daily.    Marland Kitchen LABETALOL HCL 200 MG PO TABS Oral Take 200 mg by mouth daily.    Marland Kitchen LEVETIRACETAM 250 MG PO TABS Oral Take 250 mg by mouth every 12 (twelve) hours.    Marland Kitchen PREDNISONE 20 MG PO TABS Oral Take 20 mg by mouth 2 (two) times daily.    . TRAMADOL HCL 50 MG PO TABS Oral Take 50 mg by mouth daily as needed. For pain      BP 149/88  Pulse 75  Temp 97.5 F (36.4 C) (Oral)  Resp 20  Wt 131 lb (59.421 kg)  SpO2 98%  Physical Exam  Constitutional: She is oriented to person, place, and  time. She appears well-developed and well-nourished.  HENT:  Head: Normocephalic.  Eyes: Pupils are equal, round, and reactive to light.  Neck: Normal range of motion.  Cardiovascular: Normal rate and regular rhythm.   Pulmonary/Chest: Effort normal and breath sounds normal. No respiratory distress. She exhibits no tenderness.  Abdominal: Soft. She exhibits no distension. There is no tenderness.  Musculoskeletal: Normal range of motion. She exhibits no edema and no tenderness.  Neurological: She is alert and oriented to person, place, and time.  Skin: Skin is warm. No rash noted.    ED Course  Procedures (including critical care time)  Labs Reviewed  CBC WITH DIFFERENTIAL - Abnormal; Notable for the following:    WBC 11.4 (*)     Neutrophils Relative 79 (*)     Neutro Abs 8.9 (*)     All other components within normal limits  BASIC METABOLIC PANEL - Abnormal; Notable for the following:    Sodium 133 (*)     Potassium 6.5 (*)     Glucose, Bld 211 (*)     BUN 50 (*)     Creatinine, Ser 2.24 (*)     GFR calc non Af Amer 20 (*)     GFR calc Af Amer 23 (*)     All other components within normal limits  URINALYSIS, ROUTINE W REFLEX MICROSCOPIC - Abnormal; Notable for the following:    Protein, ur 30 (*)     Leukocytes, UA MODERATE (*)     All other components within normal limits  URINE MICROSCOPIC-ADD ON - Abnormal; Notable for the following:    Squamous Epithelial / LPF FEW (*)     Bacteria, UA FEW (*)     All other components within normal limits  TROPONIN I  POTASSIUM   No results found.   1. UTI (lower urinary tract infection)       MDM   Feeling better after IV hydration and antibiotic         Arman Filter, NP 05/21/12 [phone]

## 2012-05-20 NOTE — ED Notes (Signed)
Pts son called EMS because pt was lethargic, and weak.  Pt was diagnosed with a UTI several days ago but just started her antibiotic tonight.  EMS reports patient was slow to answer, but oriented x4. CBG-[id number]    Pt denies pain, SOB

## 2012-05-20 NOTE — ED Notes (Signed)
Lab reported high potassium at 6.5  Reported to nurse Aram Beecham RN,  Also lab noted slightly hemolysis

## 2012-05-21 MED ORDER — DEXTROSE 5 % IV SOLN
1.0000 g | Freq: Once | INTRAVENOUS | Status: AC
  Administered 2012-05-21: 1 g via INTRAVENOUS
  Filled 2012-05-21: qty 10

## 2012-05-21 NOTE — ED Provider Notes (Signed)
History/physical exam/procedure(s) were performed by non-physician practitioner and as supervising physician I was immediately available for consultation/collaboration. I have reviewed all notes and am in agreement with care and plan.   Kassy Mcenroe S Jaretzy Lhommedieu, MD 05/21/12 1758 

## 2012-05-21 NOTE — ED Notes (Signed)
EKG old and new given to EDP, Manus Rudd, NP.

## 2012-05-21 NOTE — ED Notes (Signed)
Patient Potassium level high.  Family notified that more labs would be needed to confirm level

## 2012-05-21 NOTE — ED Notes (Signed)
Patient is alert and oriented x1.  Family was given DC instructions and follow up visit instructions.  Family  gave verbal understanding. She was DC via wheelchair to home.  V/S stable.  He was not showing any signs of distress on DC

## 2012-05-24 ENCOUNTER — Emergency Department (HOSPITAL_COMMUNITY)
Admission: EM | Admit: 2012-05-24 | Discharge: 2012-05-24 | Disposition: A | Discharge status: Home / Self Care | Payer: Medicare Other | Attending: Emergency Medicine | Admitting: Emergency Medicine

## 2012-05-24 ENCOUNTER — Encounter (HOSPITAL_COMMUNITY): Payer: Self-pay

## 2012-05-24 ENCOUNTER — Emergency Department (HOSPITAL_COMMUNITY): Payer: Medicare Other

## 2012-05-24 DIAGNOSIS — G40909 Epilepsy, unspecified, not intractable, without status epilepticus: Secondary | ICD-10-CM | POA: Insufficient documentation

## 2012-05-24 DIAGNOSIS — E875 Hyperkalemia: Secondary | ICD-10-CM | POA: Insufficient documentation

## 2012-05-24 DIAGNOSIS — I129 Hypertensive chronic kidney disease with stage 1 through stage 4 chronic kidney disease, or unspecified chronic kidney disease: Secondary | ICD-10-CM | POA: Insufficient documentation

## 2012-05-24 DIAGNOSIS — H409 Unspecified glaucoma: Secondary | ICD-10-CM | POA: Insufficient documentation

## 2012-05-24 DIAGNOSIS — E785 Hyperlipidemia, unspecified: Secondary | ICD-10-CM | POA: Insufficient documentation

## 2012-05-24 DIAGNOSIS — E871 Hypo-osmolality and hyponatremia: Secondary | ICD-10-CM | POA: Insufficient documentation

## 2012-05-24 DIAGNOSIS — Z79899 Other long term (current) drug therapy: Secondary | ICD-10-CM | POA: Insufficient documentation

## 2012-05-24 DIAGNOSIS — N183 Chronic kidney disease, stage 3 unspecified: Secondary | ICD-10-CM | POA: Insufficient documentation

## 2012-05-24 DIAGNOSIS — Z8673 Personal history of transient ischemic attack (TIA), and cerebral infarction without residual deficits: Secondary | ICD-10-CM | POA: Insufficient documentation

## 2012-05-24 DIAGNOSIS — I2510 Atherosclerotic heart disease of native coronary artery without angina pectoris: Secondary | ICD-10-CM | POA: Insufficient documentation

## 2012-05-24 DIAGNOSIS — R5381 Other malaise: Secondary | ICD-10-CM | POA: Insufficient documentation

## 2012-05-24 DIAGNOSIS — M109 Gout, unspecified: Secondary | ICD-10-CM | POA: Insufficient documentation

## 2012-05-24 DIAGNOSIS — R109 Unspecified abdominal pain: Secondary | ICD-10-CM | POA: Insufficient documentation

## 2012-05-24 DIAGNOSIS — M129 Arthropathy, unspecified: Secondary | ICD-10-CM | POA: Insufficient documentation

## 2012-05-24 LAB — URINALYSIS, ROUTINE W REFLEX MICROSCOPIC
Bilirubin Urine: NEGATIVE
Glucose, UA: NEGATIVE mg/dL
Hgb urine dipstick: NEGATIVE
Ketones, ur: NEGATIVE mg/dL
Nitrite: NEGATIVE
Protein, ur: NEGATIVE mg/dL
Specific Gravity, Urine: 1.007 (ref 1.005–1.030)
Urobilinogen, UA: 0.2 mg/dL (ref 0.0–1.0)
pH: 6 (ref 5.0–8.0)

## 2012-05-24 LAB — CBC
HCT: 40.3 % (ref 36.0–46.0)
Hemoglobin: 14.1 g/dL (ref 12.0–15.0)
MCH: 32.9 pg (ref 26.0–34.0)
MCHC: 35 g/dL (ref 30.0–36.0)
MCV: 93.9 fL (ref 78.0–100.0)
Platelets: 172 10*3/uL (ref 150–400)
RBC: 4.29 MIL/uL (ref 3.87–5.11)
RDW: 13.8 % (ref 11.5–15.5)
WBC: 13.6 10*3/uL — ABNORMAL HIGH (ref 4.0–10.5)

## 2012-05-24 LAB — BASIC METABOLIC PANEL
BUN: 33 mg/dL — ABNORMAL HIGH (ref 6–23)
CO2: 22 mEq/L (ref 19–32)
CO2: 20 mEq/L (ref 19–32)
Calcium: 10.1 mg/dL (ref 8.4–10.5)
Calcium: 9.4 mg/dL (ref 8.4–10.5)
Chloride: 99 mEq/L (ref 96–112)
Creatinine, Ser: 1.62 mg/dL — ABNORMAL HIGH (ref 0.50–1.10)
Creatinine, Ser: 1.59 mg/dL — ABNORMAL HIGH (ref 0.50–1.10)
GFR calc Af Amer: 35 mL/min — ABNORMAL LOW (ref >90)
GFR calc non Af Amer: 29 mL/min — ABNORMAL LOW (ref >90)
GFR calc non Af Amer: 30 mL/min — ABNORMAL LOW (ref >90)
Glucose, Bld: 151 mg/dL — ABNORMAL HIGH (ref 70–99)
Potassium: 5.4 mEq/L — ABNORMAL HIGH (ref 3.5–5.1)
Sodium: 126 mEq/L — ABNORMAL LOW (ref 135–145)
Sodium: 131 mEq/L — ABNORMAL LOW (ref 135–145)

## 2012-05-24 MED ORDER — SODIUM CHLORIDE 0.9 % IV BOLUS (SEPSIS)
1000.0000 mL | Freq: Once | INTRAVENOUS | Status: AC
  Administered 2012-05-24: 1000 mL via INTRAVENOUS

## 2012-05-24 NOTE — ED Notes (Signed)
ZOX:[id number]<AV> Expected date:05/24/12<BR> Expected time: 3:27 PM<BR> Means of arrival:Ambulance<BR> Comments:<BR> 48yof- cough,tachy

## 2012-05-24 NOTE — ED Notes (Signed)
Pt was asked to void for a urine specimen and was not able to void.

## 2012-05-24 NOTE — ED Notes (Signed)
Pt's son/caregiver called EMS states that pt has a UTI that is not resolving even with treatment, son states that pt is also having abd pain and gall stones, per EMS pt is alert to self, some confusion, lethargic.

## 2012-05-24 NOTE — ED Notes (Signed)
Per the son pt's LBM was last Thursday. Pt finished treatment for UTI today. Pt not having any issues with urination.  Per son pt has been c/o abd pain however pt denies abd pain at this time.

## 2012-05-24 NOTE — ED Notes (Signed)
CRITICAL VALUE ALERT  Critical value received:  K-7.3 received from [place], lab tech  Date of notification:  05/24/2012  Time of notification:  1720  Critical value read back:yes  Nurse who received alert:  Sue Lush RN  Dr. Jeraldine Loots Notified

## 2012-05-24 NOTE — ED Provider Notes (Addendum)
History     CSN: [id number]  Arrival date & time 05/24/12  1527   First MD Initiated Contact with Patient 05/24/12 1601      Chief Complaint  Patient presents with  . Abdominal Pain    per pt's son, pt also has unresolved UTI    HPI The patient presents for second time in week with familial concerns of fatigue, abdominal pain.  The family members voice concern over a non-improving UTI.  The patient herself denies any complaints.  She was evaluated here several days ago, and after resuscitation with IVF d/c in stable condition.   Past Medical History  Diagnosis Date  . Arthritis   . Hypertension   . Coronary artery disease   . Stroke   . Seizures     due to hyponatremia, resolved  . History of gout   . Glaucoma   . Cataracts, bilateral   . Acute kidney injury   . CKD (chronic kidney disease), stage III   . Hyperlipidemia   . Gallstones     Past Surgical History  Procedure Date  . Eye surgery   . Abdominal hysterectomy   . Coronary angioplasty with stent placement     History reviewed. No pertinent family history.  History  Substance Use Topics  . Smoking status: Former Games developer  . Smokeless tobacco: Never Used  . Alcohol Use: No    OB History    Grav Para Term Preterm Abortions TAB SAB Ect Mult Living                  Review of Systems  Constitutional:       Hpi  HENT: Negative.   Eyes: Negative.   Respiratory: Negative.   Cardiovascular: Negative.   Gastrointestinal: Negative.   Genitourinary: Positive for dysuria.  Musculoskeletal: Negative.   Neurological: Negative.   Psychiatric/Behavioral: Positive for confusion.    Allergies  Lactose intolerance (gi)  Home Medications   Current Outpatient Rx  Name Route Sig Dispense Refill  . BRIMONIDINE TARTRATE-TIMOLOL 0.2-0.5 % OP SOLN Left Eye Place 1 drop into the left eye every 12 (twelve) hours.    Marland Kitchen BRINZOLAMIDE 1 % OP SUSP Right Eye Place 1 drop into the right eye 3 (three) times daily.    Marland Kitchen  CALCIUM CARBONATE-VITAMIN D 500-125 MG-UNIT PO TABS Oral Take 1 tablet by mouth 2 (two) times daily.    . CEPHALEXIN 500 MG PO CAPS Oral Take 500 mg by mouth 2 (two) times daily.    Marland Kitchen DICLOFENAC SODIUM 1 % TD GEL Topical Apply 1 application topically 2 (two) times daily as needed. For pain    . ESCITALOPRAM OXALATE 5 MG PO TABS Oral Take 2.5 mg by mouth daily.    Marland Kitchen EZETIMIBE 10 MG PO TABS Oral Take 10 mg by mouth daily.    Marland Kitchen LABETALOL HCL 200 MG PO TABS Oral Take 200 mg by mouth daily.    Marland Kitchen LEVETIRACETAM 250 MG PO TABS Oral Take 250 mg by mouth every 12 (twelve) hours.    Marland Kitchen PREDNISONE 20 MG PO TABS Oral Take 20 mg by mouth 2 (two) times daily.    . TRAMADOL HCL 50 MG PO TABS Oral Take 50 mg by mouth daily as needed. For pain      BP 152/101  Pulse 67  Temp 98.7 F (37.1 C) (Oral)  Resp 18  SpO2 94%  Physical Exam  Constitutional: She is oriented to person, place, and time. She appears well-developed  and well-nourished.  HENT:  Head: Normocephalic.  Eyes: Pupils are equal, round, and reactive to light.  Neck: Normal range of motion.  Cardiovascular: Normal rate and regular rhythm.   Pulmonary/Chest: Effort normal and breath sounds normal. No respiratory distress. She exhibits no tenderness.  Abdominal: Soft. She exhibits no distension. There is no tenderness.  Musculoskeletal: Normal range of motion. She exhibits no edema and no tenderness.  Neurological: She is alert and oriented to person, place, and time.  Skin: Skin is warm. No rash noted.  Psychiatric: Cognition and memory are impaired.    ED Course  Procedures (including critical care time)   Labs Reviewed  CBC  BASIC METABOLIC PANEL  URINALYSIS, ROUTINE W REFLEX MICROSCOPIC  LACTIC ACID, PLASMA   No results found.   No diagnosis found.   O2: 99%ra, normal  Update: initial labs notable for hyponatremia, hyperkalemia, though the sample is hemolyzed.  An ECG demonstrates no changes of her T waves.   Date:  05/24/2012  Rate: 63  Rhythm: normal sinus rhythm  QRS Axis: normal  Intervals: normal  ST/T Wave abnormalities: no peaked T waves  Conduction Disutrbances:none  Narrative Interpretation:   Old EKG Reviewed: none available UNREMARKABLE  UPDATE: After 2L ns, the patient states that she feels significantly better.  MDM  This elderly female presents with generalized complaints, and on exam denies any specific concerns.  Given the patient's family's endorsement of recent decreased energy, fatigue, and possible abdominal pain she had multiple labs sent.  Labs are notable for hyponatremia, hyperkalemia, with no evidence of a urinary tract infection.  Initial labs were hemolyzed, and a repeat sample is still hyperkalemic, though less pronounced.  The patient's ECG was unchanged.  Given the improvement, the absence of other notable findings, the patient's followup appointment tomorrow, she was discharged in stable condition with explicit instructions for a repeat blood draw to ensure correction of her electrolyte abnormalities.  Return precautions were provided to 2 family members present with the patient     Gerhard Munch, MD 05/24/12 2021  Gerhard Munch, MD 06/14/12 [phone]

## 2012-05-26 ENCOUNTER — Encounter (HOSPITAL_COMMUNITY): Payer: Self-pay | Admitting: Emergency Medicine

## 2012-05-26 ENCOUNTER — Emergency Department (HOSPITAL_COMMUNITY): Payer: Medicare Other

## 2012-05-26 ENCOUNTER — Observation Stay (HOSPITAL_COMMUNITY)
Admission: EM | Admit: 2012-05-26 | Discharge: 2012-05-29 | Disposition: A | Discharge status: Home / Self Care | Payer: Medicare Other | Attending: Internal Medicine | Admitting: Internal Medicine

## 2012-05-26 DIAGNOSIS — R071 Chest pain on breathing: Secondary | ICD-10-CM | POA: Insufficient documentation

## 2012-05-26 DIAGNOSIS — R5381 Other malaise: Secondary | ICD-10-CM | POA: Insufficient documentation

## 2012-05-26 DIAGNOSIS — K8020 Calculus of gallbladder without cholecystitis without obstruction: Secondary | ICD-10-CM

## 2012-05-26 DIAGNOSIS — I2510 Atherosclerotic heart disease of native coronary artery without angina pectoris: Secondary | ICD-10-CM | POA: Insufficient documentation

## 2012-05-26 DIAGNOSIS — R0602 Shortness of breath: Secondary | ICD-10-CM

## 2012-05-26 DIAGNOSIS — R509 Fever, unspecified: Secondary | ICD-10-CM

## 2012-05-26 DIAGNOSIS — I129 Hypertensive chronic kidney disease with stage 1 through stage 4 chronic kidney disease, or unspecified chronic kidney disease: Secondary | ICD-10-CM | POA: Insufficient documentation

## 2012-05-26 DIAGNOSIS — Z87898 Personal history of other specified conditions: Secondary | ICD-10-CM

## 2012-05-26 DIAGNOSIS — R5383 Other fatigue: Secondary | ICD-10-CM | POA: Insufficient documentation

## 2012-05-26 DIAGNOSIS — K5900 Constipation, unspecified: Secondary | ICD-10-CM | POA: Insufficient documentation

## 2012-05-26 DIAGNOSIS — N179 Acute kidney failure, unspecified: Secondary | ICD-10-CM

## 2012-05-26 DIAGNOSIS — M316 Other giant cell arteritis: Secondary | ICD-10-CM

## 2012-05-26 DIAGNOSIS — G9340 Encephalopathy, unspecified: Principal | ICD-10-CM | POA: Diagnosis present

## 2012-05-26 DIAGNOSIS — D472 Monoclonal gammopathy: Secondary | ICD-10-CM

## 2012-05-26 DIAGNOSIS — R4182 Altered mental status, unspecified: Secondary | ICD-10-CM | POA: Insufficient documentation

## 2012-05-26 DIAGNOSIS — Z8739 Personal history of other diseases of the musculoskeletal system and connective tissue: Secondary | ICD-10-CM

## 2012-05-26 DIAGNOSIS — E785 Hyperlipidemia, unspecified: Secondary | ICD-10-CM

## 2012-05-26 DIAGNOSIS — Z8673 Personal history of transient ischemic attack (TIA), and cerebral infarction without residual deficits: Secondary | ICD-10-CM | POA: Insufficient documentation

## 2012-05-26 DIAGNOSIS — N1330 Unspecified hydronephrosis: Secondary | ICD-10-CM

## 2012-05-26 DIAGNOSIS — R0789 Other chest pain: Secondary | ICD-10-CM

## 2012-05-26 DIAGNOSIS — H269 Unspecified cataract: Secondary | ICD-10-CM

## 2012-05-26 DIAGNOSIS — N390 Urinary tract infection, site not specified: Secondary | ICD-10-CM

## 2012-05-26 DIAGNOSIS — H409 Unspecified glaucoma: Secondary | ICD-10-CM

## 2012-05-26 DIAGNOSIS — N183 Chronic kidney disease, stage 3 unspecified: Secondary | ICD-10-CM

## 2012-05-26 DIAGNOSIS — M129 Arthropathy, unspecified: Secondary | ICD-10-CM | POA: Insufficient documentation

## 2012-05-26 LAB — BASIC METABOLIC PANEL
BUN: 33 mg/dL — ABNORMAL HIGH (ref 6–23)
Calcium: 10.3 mg/dL (ref 8.4–10.5)
Chloride: 98 mEq/L (ref 96–112)
Creatinine, Ser: 1.75 mg/dL — ABNORMAL HIGH (ref 0.50–1.10)
GFR calc Af Amer: 31 mL/min — ABNORMAL LOW (ref >90)

## 2012-05-26 LAB — URINALYSIS, ROUTINE W REFLEX MICROSCOPIC
Bilirubin Urine: NEGATIVE
Glucose, UA: NEGATIVE mg/dL
Hgb urine dipstick: NEGATIVE
Ketones, ur: NEGATIVE mg/dL
Protein, ur: 30 mg/dL — AB
Urobilinogen, UA: 0.2 mg/dL (ref 0.0–1.0)

## 2012-05-26 LAB — CBC
HCT: 41.7 % (ref 36.0–46.0)
MCH: 32.9 pg (ref 26.0–34.0)
MCV: 96.5 fL (ref 78.0–100.0)
RDW: 13.6 % (ref 11.5–15.5)
WBC: 11.7 10*3/uL — ABNORMAL HIGH (ref 4.0–10.5)

## 2012-05-26 LAB — URINE MICROSCOPIC-ADD ON

## 2012-05-26 LAB — GRAM STAIN

## 2012-05-26 MED ORDER — ACETAMINOPHEN 650 MG RE SUPP: 650.0000 mg | Freq: Four times a day (QID) | RECTAL | Status: DC | PRN

## 2012-05-26 MED ORDER — CLONIDINE HCL 0.1 MG PO TABS
0.1000 mg | ORAL_TABLET | Freq: Four times a day (QID) | ORAL | Status: DC | PRN
  Administered 2012-05-27: 0.1 mg via ORAL
  Filled 2012-05-26 (×2): qty 1

## 2012-05-26 MED ORDER — HEPARIN SODIUM (PORCINE) 5000 UNIT/ML IJ SOLN
5000.0000 [IU] | Freq: Three times a day (TID) | INTRAMUSCULAR | Status: DC
  Administered 2012-05-27 – 2012-05-29 (×8): 5000 [IU] via SUBCUTANEOUS
  Filled 2012-05-26 (×11): qty 1

## 2012-05-26 MED ORDER — BRIMONIDINE TARTRATE-TIMOLOL 0.2-0.5 % OP SOLN: 1.0000 [drp] | Freq: Two times a day (BID) | OPHTHALMIC | Status: DC

## 2012-05-26 MED ORDER — BRIMONIDINE TARTRATE 0.2 % OP SOLN
1.0000 [drp] | Freq: Two times a day (BID) | OPHTHALMIC | Status: DC
  Administered 2012-05-27 – 2012-05-29 (×6): 1 [drp] via OPHTHALMIC
  Filled 2012-05-26: qty 5

## 2012-05-26 MED ORDER — SODIUM CHLORIDE 0.9 % IV SOLN
INTRAVENOUS | Status: DC
  Administered 2012-05-26 – 2012-05-27 (×4): via INTRAVENOUS

## 2012-05-26 MED ORDER — DOCUSATE SODIUM 100 MG PO CAPS
100.0000 mg | ORAL_CAPSULE | Freq: Two times a day (BID) | ORAL | Status: DC
Start: 2012-05-26 — End: 2012-05-29
  Administered 2012-05-27 – 2012-05-29 (×6): 100 mg via ORAL
  Filled 2012-05-26 (×7): qty 1

## 2012-05-26 MED ORDER — TIMOLOL MALEATE 0.5 % OP SOLN
1.0000 [drp] | Freq: Two times a day (BID) | OPHTHALMIC | Status: DC
  Administered 2012-05-27 – 2012-05-29 (×6): 1 [drp] via OPHTHALMIC
  Filled 2012-05-26: qty 5

## 2012-05-26 MED ORDER — EZETIMIBE 10 MG PO TABS
10.0000 mg | ORAL_TABLET | Freq: Every day | ORAL | Status: DC
  Administered 2012-05-27 – 2012-05-29 (×3): 10 mg via ORAL
  Filled 2012-05-26 (×3): qty 1

## 2012-05-26 MED ORDER — IPRATROPIUM BROMIDE 0.02 % IN SOLN: 0.5000 mg | RESPIRATORY_TRACT | Status: DC | PRN

## 2012-05-26 MED ORDER — DEXTROSE 5 % IV SOLN
1.0000 g | Freq: Once | INTRAVENOUS | Status: AC
  Administered 2012-05-26: 1 g via INTRAVENOUS
  Filled 2012-05-26: qty 10

## 2012-05-26 MED ORDER — TIOTROPIUM BROMIDE MONOHYDRATE 18 MCG IN CAPS
18.0000 ug | ORAL_CAPSULE | Freq: Every day | RESPIRATORY_TRACT | Status: DC
  Administered 2012-05-27 – 2012-05-28 (×2): 18 ug via RESPIRATORY_TRACT
  Filled 2012-05-26: qty 5

## 2012-05-26 MED ORDER — ONDANSETRON HCL 4 MG/2ML IJ SOLN: 4.0000 mg | Freq: Four times a day (QID) | INTRAMUSCULAR | Status: DC | PRN

## 2012-05-26 MED ORDER — ESCITALOPRAM OXALATE 5 MG PO TABS
2.5000 mg | ORAL_TABLET | Freq: Every day | ORAL | Status: DC
  Administered 2012-05-27 – 2012-05-29 (×3): 2.5 mg via ORAL
  Filled 2012-05-26 (×4): qty 1

## 2012-05-26 MED ORDER — BRINZOLAMIDE 1 % OP SUSP
1.0000 [drp] | Freq: Three times a day (TID) | OPHTHALMIC | Status: DC
  Administered 2012-05-27 – 2012-05-29 (×4): 1 [drp] via OPHTHALMIC
  Filled 2012-05-26 (×3): qty 10

## 2012-05-26 MED ORDER — PREDNISONE 10 MG PO TABS
10.0000 mg | ORAL_TABLET | Freq: Every day | ORAL | Status: DC
  Administered 2012-05-27 – 2012-05-28 (×2): 10 mg via ORAL
  Filled 2012-05-26 (×4): qty 1

## 2012-05-26 MED ORDER — LEVETIRACETAM 250 MG PO TABS
250.0000 mg | ORAL_TABLET | Freq: Two times a day (BID) | ORAL | Status: DC
  Administered 2012-05-27 – 2012-05-29 (×6): 250 mg via ORAL
  Filled 2012-05-26 (×7): qty 1

## 2012-05-26 MED ORDER — ALUM & MAG HYDROXIDE-SIMETH 200-200-20 MG/5ML PO SUSP: 30.0000 mL | Freq: Four times a day (QID) | ORAL | Status: DC | PRN

## 2012-05-26 MED ORDER — HYDROCODONE-ACETAMINOPHEN 5-325 MG PO TABS
1.0000 | ORAL_TABLET | ORAL | Status: DC | PRN
  Administered 2012-05-27 – 2012-05-28 (×2): 1 via ORAL
  Administered 2012-05-29: 2 via ORAL
  Filled 2012-05-26 (×2): qty 1
  Filled 2012-05-26: qty 2

## 2012-05-26 MED ORDER — FLUTICASONE-SALMETEROL 250-50 MCG/DOSE IN AEPB
1.0000 | INHALATION_SPRAY | Freq: Two times a day (BID) | RESPIRATORY_TRACT | Status: DC
  Administered 2012-05-27 – 2012-05-28 (×3): 1 via RESPIRATORY_TRACT
  Filled 2012-05-26 (×2): qty 14

## 2012-05-26 MED ORDER — SODIUM POLYSTYRENE SULFONATE 15 GM/60ML PO SUSP
15.0000 g | Freq: Once | ORAL | Status: AC
  Administered 2012-05-27: 15 g via ORAL
  Filled 2012-05-26: qty 60

## 2012-05-26 MED ORDER — BISACODYL 10 MG RE SUPP: 10.0000 mg | Freq: Every day | RECTAL | Status: DC | PRN

## 2012-05-26 MED ORDER — ACETAMINOPHEN 325 MG PO TABS: 650.0000 mg | ORAL_TABLET | Freq: Four times a day (QID) | ORAL | Status: DC | PRN

## 2012-05-26 MED ORDER — ALBUTEROL SULFATE (5 MG/ML) 0.5% IN NEBU: 2.5000 mg | INHALATION_SOLUTION | RESPIRATORY_TRACT | Status: DC | PRN

## 2012-05-26 MED ORDER — DEXTROSE 5 % IV SOLN
500.0000 mg | Freq: Once | INTRAVENOUS | Status: AC
  Administered 2012-05-26: 500 mg via INTRAVENOUS
  Filled 2012-05-26: qty 500

## 2012-05-26 MED ORDER — ONDANSETRON HCL 4 MG PO TABS: 4.0000 mg | ORAL_TABLET | Freq: Four times a day (QID) | ORAL | Status: DC | PRN

## 2012-05-26 MED ORDER — DEXTROSE 5 % IV SOLN
1.0000 g | INTRAVENOUS | Status: DC
  Administered 2012-05-27: 1 g via INTRAVENOUS
  Filled 2012-05-26 (×3): qty 10

## 2012-05-26 NOTE — ED Notes (Signed)
Awaiting blood cultures to be drawn for antibiotic administration.

## 2012-05-26 NOTE — ED Notes (Signed)
Admitting MD at bedside.

## 2012-05-26 NOTE — ED Notes (Signed)
05/24/12: Chest tightness; uti last week - [facility] and cleared. Elevated potassium, constipated x 1 week; son called and stated, "delerius." confused

## 2012-05-26 NOTE — ED Notes (Signed)
Dr. Joneen Roach notified that pt is taking Prednisone 10 mg daily and notified of pt's elevated BP.  States she will place orders for medication for same. Report given to Burna Mortimer, RN and she was notified of same.

## 2012-05-26 NOTE — H&P (Signed)
PCP:   [name], MD   Chief Complaint:  Confusion  HPI: This is a 78 year old female who was recently in the ER with a diagnosis of confusion. She was diagnosed with a UTI and discharged home on 7 days of cephalexin. Per family her confusion has never improved and has gotten worse daily. Today at home she was very confused, dizzy and short of breath. She felt presyncopal. To me the patient denies any chest pains. On further questioning, patient's dizziness and shortness of breath is not new, this is been ongoing and worsening for the past 3 years. The patient does currently have a low-grade fever, intermittent nausea and intermittent burning urination.  The patient lives at home with family members who smoke. The patient does report a frequent wheezing. There is some reports of her using nebulizers in the remote past, but this is also has been discontinued. The patient does appear to minimally ambulatory, able to get from the bedroom to the living room with assistance and with great personal effort. She often gets winded and lightheaded in the process.   This is her third visit to the ER, the hospitalist service was requested to admit. History provided by the patient and her 2 sons at bedside. Patient has been constipated for a week.  Review of Systems:  The patient denies anorexia, weight loss,, vision loss, decreased hearing, hoarseness, chest pain, syncope, peripheral edema, balance deficits, hemoptysis, abdominal pain, melena, hematochezia, severe indigestion/heartburn, hematuria, incontinence, genital sores, muscle weakness, suspicious skin lesions, transient blindness, depression, unusual weight change, abnormal bleeding, enlarged lymph nodes, angioedema, and breast masses.  Past Medical History: Past Medical History  Diagnosis Date  . Arthritis   . Hypertension   . Coronary artery disease   . Stroke   . Seizures     due to hyponatremia, resolved  . History of gout   . Glaucoma    . Cataracts, bilateral   . Acute kidney injury   . CKD (chronic kidney disease), stage III   . Hyperlipidemia   . Gallstones    Past Surgical History  Procedure Date  . Eye surgery   . Abdominal hysterectomy   . Coronary angioplasty with stent placement     Medications: Prior to Admission medications   Medication Sig Start Date End Date Taking? Authorizing Provider  brimonidine-timolol (COMBIGAN) 0.2-0.5 % ophthalmic solution Place 1 drop into the left eye every 12 (twelve) hours.   Yes Historical Provider, MD  brinzolamide (AZOPT) 1 % ophthalmic suspension Place 1 drop into the right eye 3 (three) times daily.   Yes Historical Provider, MD  Calcium Carbonate-Vitamin D (CALCIUM 500/D) 500-125 MG-UNIT TABS Take 1 tablet by mouth 2 (two) times daily.   Yes Historical Provider, MD  diclofenac sodium (VOLTAREN) 1 % GEL Apply 1 application topically 2 (two) times daily as needed. For pain   Yes Historical Provider, MD  escitalopram (LEXAPRO) 5 MG tablet Take 2.5 mg by mouth daily.   Yes Historical Provider, MD  ezetimibe (ZETIA) 10 MG tablet Take 10 mg by mouth daily.   Yes Historical Provider, MD  levETIRAcetam (KEPPRA) 250 MG tablet Take 250 mg by mouth every 12 (twelve) hours.   Yes Historical Provider, MD  traMADol (ULTRAM) 50 MG tablet Take 50 mg by mouth daily as needed. For pain   Yes Historical Provider, MD    Allergies:   Allergies  Allergen Reactions  . Lactose Intolerance (Gi) Diarrhea and Nausea Only    Social History:  reports  that she has quit smoking. She has never used smokeless tobacco. She reports that she does not drink alcohol or use illicit drugs.  Family History: Hypertension, diabetes mellitus  Physical Exam: Filed Vitals:   05/26/12 1923 05/26/12 1930 05/26/12 2000 05/26/12 2030  BP: 163/89 163/98 141/93 156/92  Pulse:  76 84 80  Temp:      TempSrc:      Resp: 20 19 18 21   SpO2: 100% 100% 100% 99%    General:  Alert and oriented times three but  intermittently confused, well developed and nourished, no acute distress Eyes: Cataracts, pink conjunctiva, no scleral icterus ENT: Moist oral mucosa, neck supple, no thyromegaly Lungs: clear to ascultation, mild wheeze, no crackles, no use of accessory muscles Cardiovascular: regular rate and rhythm, no regurgitation, no gallops, no murmurs. No carotid bruits, no JVD Abdomen: soft, positive BS, non-tender, non-distended, no organomegaly, not an acute abdomen GU: not examined Neuro: CN II - XII grossly intact, sensation intact Musculoskeletal: strength 4/5 all extremities, no clubbing, cyanosis or edema Skin: no rash, no subcutaneous crepitation, no decubitus   Labs on Admission:   [facility] 05/26/12 1708 05/24/12 1809  NA 133* 131*  K 5.5* 5.4*  CL 98 99  CO2 22 20  GLUCOSE 171* 151*  BUN 33* 33*  CREATININE 1.75* 1.59*  CALCIUM 10.3 9.4  MG -- --  PHOS -- --   No results found for this basename: AST:2,ALT:2,ALKPHOS:2,BILITOT:2,PROT:2,ALBUMIN:2 in the last 72 hours No results found for this basename: LIPASE:2,AMYLASE:2 in the last 72 hours  Basename 05/26/12 1708 05/24/12 1638  WBC 11.7* 13.6*  NEUTROABS -- --  HGB 14.2 14.1  HCT 41.7 40.3  MCV 96.5 93.9  PLT 137* 172    Basename 05/26/12 1708  CKTOTAL --  CKMB --  CKMBINDEX --  TROPONINI <0.30   No components found with this basename: POCBNP:3 No results found for this basename: DDIMER:2 in the last 72 hours No results found for this basename: HGBA1C:2 in the last 72 hours No results found for this basename: CHOL:2,HDL:2,LDLCALC:2,TRIG:2,CHOLHDL:2,LDLDIRECT:2 in the last 72 hours No results found for this basename: TSH,T4TOTAL,FREET3,T3FREE,THYROIDAB in the last 72 hours No results found for this basename: VITAMINB12:2,FOLATE:2,FERRITIN:2,TIBC:2,IRON:2,RETICCTPCT:2 in the last 72 hours  Micro Results: Results for EYANA, FEIERTAG (MRN [id number]) as of 05/26/2012 20:49  Ref. Range 05/26/2012 16:25  Color, Urine  Latest Range: YELLOW  YELLOW  APPearance Latest Range: CLEAR  CLEAR  Specific Gravity, Urine Latest Range: 1.005-1.030  1.011  pH Latest Range: 5.0-8.0  5.5  Glucose Latest Range: NEGATIVE mg/dL NEGATIVE  Bilirubin Urine Latest Range: NEGATIVE  NEGATIVE  Ketones, ur Latest Range: NEGATIVE mg/dL NEGATIVE  Protein Latest Range: NEGATIVE mg/dL 30 (A)  Urobilinogen, UA Latest Range: 0.0-1.0 mg/dL 0.2  Nitrite Latest Range: NEGATIVE  NEGATIVE  Leukocytes, UA Latest Range: NEGATIVE  TRACE (A)  Hgb urine dipstick Latest Range: NEGATIVE  NEGATIVE  WBC, UA Latest Range: <3 WBC/hpf 3-6  Squamous Epithelial / LPF Latest Range: RARE  FEW (A)  Bacteria, UA Latest Range: RARE  RARE  Casts Latest Range: NEGATIVE  HYALINE CASTS (A)     Radiological Exams on Admission: Dg Chest 2 View  05/26/2012  *RADIOLOGY REPORT*  Clinical Data: Chest pain and shortness of breath.  Altered mental status.  CHEST - 2 VIEW  Comparison: Chest x-ray 11/01/2011.  Findings: Lung volumes are normal.  No consolidative airspace disease.  No pleural effusions.  No pneumothorax.  No pulmonary nodule or mass noted.  Pulmonary  vasculature and the cardiomediastinal silhouette are within normal limits.  IMPRESSION: 1. No radiographic evidence of acute cardiopulmonary disease.   Original Report Authenticated By: Florencia Reasons, M.D.     Assessment/Plan Present on Admission:  .Encephalopathy acute UTI Bring in for 23 hour observation Urine cultures ordered. Urinalysis is negative, however, patient has been on antibiotics for 7 days. Patient also on steroids at 10 mg daily for temporal arteritis. I believe given patient's symptoms of low-grade fever and burning urination plus persistent encephalopathy patient may have a low-grade UTI that is being suppressed but not treated by the antibiotic. Recommend a more extended course of antibiotics. We'll try to culture but will likely be unsuccessful due to prior recent treatment with  antibiotics. For now will start IV Rocephin and likely discharge on board coverage antibiotics. Per family patient was not confused prior to her UTI .CKD (chronic kidney disease), stage III Recurrent hyperkalemia Constipation Will give the patient a single dose of Kayexalate. This will hopefully treat both constipation and hyperkalemia We'll start regular bowel regimen starting with Colace Temporal arteritis  Continue steroids History of seizures  .Hyperlipidemia .Monoclonal gammopathy .Cataracts, bilateral Resume home medication, including Keppra. Per family patient had a single episode of seizure in 2011 when hyponatremic. EEG from the time was read as negative. Shortness of breath Patient lives in a house with smokers, she states she does wheeze. I believe she has an element of COPD. Tobacco cessation on the house was recommended to the family. Would also recommend treated with Advair, Spiriva and MDIs at discharge. Patient herself has no personal history of smoking Patient also likely has decreased exercise tolerance due to age Social Family requesting discharge to nursing home for rehabilitation. They are aware she's coming in on observation status and may not qualify.  Full code DVT prophylaxis   Lousie Calico 05/26/2012, 8:41 PM

## 2012-05-26 NOTE — ED Notes (Signed)
Told last wed. Not to take bp meds. For unknown reason?

## 2012-05-26 NOTE — ED Provider Notes (Signed)
Level V caveat patient poor historian Complains of pleuritic chest pain cough and general malaise   on exam patient is alert follows simple commands moves all extremities  Doug Sou, MD 05/26/12 1954

## 2012-05-26 NOTE — ED Notes (Signed)
Still awaiting lab to draw blood cultures.

## 2012-05-26 NOTE — ED Provider Notes (Signed)
History     CSN: [id number]  Arrival date & time 05/26/12  1544   None     Chief Complaint  Patient presents with  . Chest Pain  . Shortness of Breath  . Altered Mental Status    (Consider location/radiation/quality/duration/timing/severity/associated sxs/prior treatment) Patient is a 78 y.o. female presenting with chest pain. The history is provided by the patient. The history is limited by the condition of the patient and the absence of a caregiver.  Chest Pain Episode onset: unknown, with ambulation. Chest pain occurs intermittently. The chest pain is unchanged. The pain is associated with breathing and exertion. The pain is currently at 3/10. The severity of the pain is mild. The quality of the pain is described as tightness. The pain does not radiate. Chest pain is worsened by deep breathing and exertion. Primary symptoms include a fever, shortness of breath and cough. Pertinent negatives for primary symptoms include no fatigue, no syncope, no wheezing, no palpitations, no abdominal pain, no nausea, no vomiting and no dizziness. Altered mental status: reading notes, patient either with dementia or delirium; no family here to clarify.  The fever began today. The maximum temperature recorded prior to her arrival was 100 to 100.9 F. The temperature was taken by an oral thermometer. She tried nothing for the symptoms.     Past Medical History  Diagnosis Date  . Arthritis   . Hypertension   . Coronary artery disease   . Stroke   . Seizures     due to hyponatremia, resolved  . History of gout   . Glaucoma   . Cataracts, bilateral   . Acute kidney injury   . CKD (chronic kidney disease), stage III   . Hyperlipidemia   . Gallstones     Past Surgical History  Procedure Date  . Eye surgery   . Abdominal hysterectomy   . Coronary angioplasty with stent placement     No family history on file.  History  Substance Use Topics  . Smoking status: Former Games developer  . Smokeless  tobacco: Never Used  . Alcohol Use: No    OB History    Grav Para Term Preterm Abortions TAB SAB Ect Mult Living                  Review of Systems  Constitutional: Positive for fever. Negative for fatigue.  Respiratory: Positive for cough and shortness of breath. Negative for wheezing.   Cardiovascular: Positive for chest pain. Negative for palpitations and syncope.  Gastrointestinal: Negative for nausea, vomiting, abdominal pain and diarrhea.  Neurological: Negative for dizziness and headaches.  Psychiatric/Behavioral: Altered mental status: reading notes, patient either with dementia or delirium; no family here to clarify.  All other systems reviewed and are negative.    Allergies  Lactose intolerance (gi)  Home Medications   Current Outpatient Rx  Name Route Sig Dispense Refill  . BRIMONIDINE TARTRATE-TIMOLOL 0.2-0.5 % OP SOLN Left Eye Place 1 drop into the left eye every 12 (twelve) hours.    Marland Kitchen BRINZOLAMIDE 1 % OP SUSP Right Eye Place 1 drop into the right eye 3 (three) times daily.    Marland Kitchen CALCIUM CARBONATE-VITAMIN D 500-125 MG-UNIT PO TABS Oral Take 1 tablet by mouth 2 (two) times daily.    . CEPHALEXIN 500 MG PO CAPS Oral Take 500 mg by mouth 2 (two) times daily.    Marland Kitchen DICLOFENAC SODIUM 1 % TD GEL Topical Apply 1 application topically 2 (two) times daily as needed.  For pain    . ESCITALOPRAM OXALATE 5 MG PO TABS Oral Take 2.5 mg by mouth daily.    Marland Kitchen EZETIMIBE 10 MG PO TABS Oral Take 10 mg by mouth daily.    Marland Kitchen LABETALOL HCL 200 MG PO TABS Oral Take 200 mg by mouth daily.    Marland Kitchen LEVETIRACETAM 250 MG PO TABS Oral Take 250 mg by mouth every 12 (twelve) hours.    Marland Kitchen PREDNISONE 20 MG PO TABS Oral Take 20 mg by mouth 2 (two) times daily.    . TRAMADOL HCL 50 MG PO TABS Oral Take 50 mg by mouth daily as needed. For pain      BP 157/106  Pulse 88  Temp 98 F (36.7 C) (Oral)  Resp 13  SpO2 99%  Physical Exam  Nursing note and vitals reviewed. Constitutional: She appears  well-developed and well-nourished. No distress.  HENT:  Head: Normocephalic and atraumatic.  Eyes: EOM are normal. Pupils are equal, round, and reactive to light.  Neck: Normal range of motion.  Cardiovascular: Normal rate and normal heart sounds.   Pulmonary/Chest: Effort normal and breath sounds normal. No respiratory distress. She has no wheezes. She has no rales. She exhibits no tenderness.  Abdominal: Soft. She exhibits no distension. There is no tenderness.  Musculoskeletal: Normal range of motion.  Neurological: She is alert. No cranial nerve deficit. She exhibits normal muscle tone. Coordination normal.  Skin: Skin is warm and dry.    ED Course  Procedures (including critical care time)  Labs Reviewed  URINALYSIS, ROUTINE W REFLEX MICROSCOPIC - Abnormal; Notable for the following:    Protein, ur 30 (*)     Leukocytes, UA TRACE (*)     All other components within normal limits  CBC - Abnormal; Notable for the following:    WBC 11.7 (*)     Platelets 137 (*)     All other components within normal limits  BASIC METABOLIC PANEL - Abnormal; Notable for the following:    Sodium 133 (*)     Potassium 5.5 (*)     Glucose, Bld 171 (*)     BUN 33 (*)     Creatinine, Ser 1.75 (*)     GFR calc non Af Amer 27 (*)     GFR calc Af Amer 31 (*)     All other components within normal limits  URINE MICROSCOPIC-ADD ON - Abnormal; Notable for the following:    Squamous Epithelial / LPF FEW (*)     Casts HYALINE CASTS (*)     All other components within normal limits  TROPONIN I  GRAM STAIN  URINE CULTURE  CULTURE, BLOOD (ROUTINE X 2)  CULTURE, BLOOD (ROUTINE X 2)  BASIC METABOLIC PANEL  CBC   Dg Abd 1 View  05/24/2012  *RADIOLOGY REPORT*  Clinical Data: Abdominal pain  ABDOMEN - 1 VIEW  Comparison: None.  Findings: Normal bowel gas pattern.  No obstruction.  There are multiple phleboliths in the central lower abdomen and pelvis.  The abdominal and pelvic soft tissues are otherwise  unremarkable.  The bones are demineralized.  There are degenerative changes of the lower lumbar spine.  The lung bases are clear.  IMPRESSION: No acute findings.  No evidence of obstruction.   Original Report Authenticated By: Domenic Moras, M.D.    Date: 05/26/2012  Rate: 84  Rhythm: normal sinus rhythm  QRS Axis: normal  Intervals: normal  ST/T Wave abnormalities: apparent new T wave inversion in  aVL  Conduction Disutrbances:none  Narrative Interpretation: NSR with nonspecific t wave change  Old EKG Reviewed: changes noted: New T wave inversion in aVL from 05/24/12     1. Fever   2. Feeling of chest tightness   3. Encephalopathy acute   4. UTI (lower urinary tract infection)       MDM  4:12 PM Pt seen and examined. Pt complaining of chest tightness and SOB with exertion. She is unable to give a timeline for this. Pt underwent treatment for UTI recently, so will recheck urine. Will also work up from [employer] prospective. No focal complaints at this time.  Workup unremarkable thus far beyond mildly elevated WBC.   7:27 PM Pt has become febrile. Will send blood cultures and start antibiotics. Pt complaining of chest pain and SOB with recent UTI, so will do rocephin and azithro as respiratory or urine are most likely source of fever. Will admit.   Daleen Bo, MD 05/27/12 [phone]

## 2012-05-26 NOTE — ED Notes (Signed)
Pt condition unchanged. Son at bedside.  He states she has been declining since having a UTI a couple of weeks ago.  Saw her PMD yesterday for a recheck

## 2012-05-26 NOTE — ED Notes (Signed)
X 1 ntg adn 324 mg asa.

## 2012-05-26 NOTE — Progress Notes (Signed)
[name] [id number] Admitted to 5504: 05/26/2012 @ 10:30 PM Attending Provider: Gery Pray, MD    [name] is a 78 y.o. female patient admitted from ED awake, alert  & orientated  X 3,  Full Code, VSS - Blood pressure @ 2240 161/110, rechecked at 2300 was158/101, pulse 84, temperature 97.6 F (36.4 C), temperature source Oral, resp. rate 18, height 5\' 1"  (1.549 m), weight 62 kg (136 lb 11 oz), SpO2 100.00%, O2 2L nasal cannular, no c/o shortness of breath, no c/o chest pain, no distress noted.    IV site WDL:  hand left, condition patent and no redness with a transparent dsg that's clean dry and intact.  Allergies:   Allergies  Allergen Reactions  . Lactose Intolerance (Gi) Diarrhea and Nausea Only     Past Medical History  Diagnosis Date  . Arthritis   . Hypertension   . Coronary artery disease   . Stroke   . Seizures     due to hyponatremia, resolved  . History of gout   . Glaucoma   . Cataracts, bilateral   . Acute kidney injury   . CKD (chronic kidney disease), stage III   . Hyperlipidemia   . Gallstones     History:  obtained from patients two sons.  Pt and sons orientation to unit, room and routine. Information packet given to patient/family and safety video watched.  Admission INP armband ID verified with patient/family, and in place. SR up x 2, fall risk assessment complete with Patient and family verbalizing understanding of risks associated with falls. Pt and family verbalize an understanding of how to use the call bell and to call for help before getting out of bed.  Skin, clean-dry- intact without evidence of bruising, or skin tears.   No evidence of skin break down noted on exam.   Will cont to monitor and assist as needed.  Julien Nordmann [facility], RN 05/26/2012 11:40 PM

## 2012-05-27 ENCOUNTER — Observation Stay (HOSPITAL_COMMUNITY): Payer: Medicare Other

## 2012-05-27 ENCOUNTER — Encounter (HOSPITAL_COMMUNITY): Payer: Self-pay | Admitting: Radiology

## 2012-05-27 DIAGNOSIS — R0602 Shortness of breath: Secondary | ICD-10-CM | POA: Diagnosis present

## 2012-05-27 DIAGNOSIS — N179 Acute kidney failure, unspecified: Secondary | ICD-10-CM

## 2012-05-27 LAB — CBC
HCT: 39.1 % (ref 36.0–46.0)
Hemoglobin: 13.5 g/dL (ref 12.0–15.0)
MCH: 32.8 pg (ref 26.0–34.0)
MCV: 95.1 fL (ref 78.0–100.0)
Platelets: 132 10*3/uL — ABNORMAL LOW (ref 150–400)
RBC: 4.11 MIL/uL (ref 3.87–5.11)
WBC: 9.2 10*3/uL (ref 4.0–10.5)

## 2012-05-27 LAB — BASIC METABOLIC PANEL
CO2: 21 mEq/L (ref 19–32)
Calcium: 9.2 mg/dL (ref 8.4–10.5)
Chloride: 102 mEq/L (ref 96–112)
Glucose, Bld: 121 mg/dL — ABNORMAL HIGH (ref 70–99)
Potassium: 4.5 mEq/L (ref 3.5–5.1)
Sodium: 136 mEq/L (ref 135–145)

## 2012-05-27 MED ORDER — CLONIDINE HCL 0.1 MG PO TABS
0.1000 mg | ORAL_TABLET | Freq: Once | ORAL | Status: AC
  Administered 2012-05-27: 0.1 mg via ORAL
  Filled 2012-05-27 (×2): qty 1

## 2012-05-27 MED ORDER — POLYETHYLENE GLYCOL 3350 17 G PO PACK
17.0000 g | PACK | Freq: Every day | ORAL | Status: DC
  Administered 2012-05-27 – 2012-05-28 (×2): 17 g via ORAL
  Filled 2012-05-27 (×3): qty 1

## 2012-05-27 NOTE — Evaluation (Signed)
Physical Therapy Evaluation Patient Details Name: [name] MRN: [id number] DOB: [birth year] Today's Date: 05/27/2012 Time: 4098-1191 PT Time Calculation (min): 33 min  PT Assessment / Plan / Recommendation Clinical Impression  pt adm with confusion likely due to UTI.  Mobility limited by mild weakness and pt could benefit from PT on acute    PT Assessment  Patient needs continued PT services    Follow Up Recommendations  Home health PT;Other (comment);Supervision for mobility/OOB (up to 24 hour assist)    Does the patient have the potential to tolerate intense rehabilitation      Barriers to Discharge None Has 10 children    Equipment Recommendations  None recommended by PT    Recommendations for Other Services     Frequency Min 3X/week    Precautions / Restrictions Precautions Precautions: Fall   Pertinent Vitals/Pain       Mobility  Bed Mobility Bed Mobility: Supine to Sit;Sitting - Scoot to Edge of Bed;Sit to Supine Supine to Sit: 3: Mod assist;HOB elevated Sitting - Scoot to Edge of Bed: 4: Min assist Sit to Supine: 2: Max assist Details for Bed Mobility Assistance: vc/tc's for safe technique/guidance (legally blind); truncal assist Transfers Transfers: Sit to Stand;Stand to Sit Sit to Stand: 4: Min assist;With upper extremity assist;From bed Stand to Sit: With upper extremity assist;4: Min guard;To bed Details for Transfer Assistance: vc's for hand placement; assist to come forward Ambulation/Gait Ambulation/Gait Assistance: 4: Min assist Ambulation Distance (Feet): 25 Feet Assistive device: Rolling walker Ambulation/Gait Assistance Details: guarded short equal steps; needed physical cuing for steadying and maneuvering RW Gait Pattern: Step-through pattern;Decreased step length - right;Decreased step length - left;Decreased stride length Stairs: No    Shoulder Instructions     Exercises     PT Diagnosis: Generalized weakness  PT Problem List:  Decreased strength;Decreased activity tolerance;Decreased mobility;Pain PT Treatment Interventions: Gait training;Functional mobility training;Therapeutic activities;Patient/family education   PT Goals Acute Rehab PT Goals PT Goal Formulation: With patient Time For Goal Achievement: 06/03/12 Potential to Achieve Goals: Good Pt will go Supine/Side to Sit: with modified independence PT Goal: Supine/Side to Sit - Progress: Goal set today Pt will go Sit to Stand: with modified independence PT Goal: Sit to Stand - Progress: Goal set today Pt will Transfer Bed to Chair/Chair to Bed: with modified independence PT Transfer Goal: Bed to Chair/Chair to Bed - Progress: Goal set today Pt will Ambulate: 16 - 50 feet;with supervision;with least restrictive assistive device PT Goal: Ambulate - Progress: Goal set today  Visit Information  Last PT Received On: 05/27/12 Assistance Needed: +1    Subjective Data  Subjective: I can get around my house by myself Patient Stated Goal: Be ablel to go home   Prior Functioning  Home Living Lives With: Alone;Other (Comment) (son and ?nephew are around often) Available Help at Discharge: Family;Available PRN/intermittently (most of the day) Type of Home: Apartment Home Access: Ramped entrance Home Layout: One level Bathroom Toilet: Standard Home Adaptive Equipment: Walker - rolling;Wheelchair - manual Prior Function Level of Independence: Independent Able to Take Stairs?: No Driving: No Communication Communication: No difficulties Dominant Hand: Right    Cognition  Overall Cognitive Status: Appears within functional limits for tasks assessed/performed (some confusion) Arousal/Alertness: Awake/alert Orientation Level: Appears intact for tasks assessed Behavior During Session: [facility] for tasks performed    Extremity/Trunk Assessment Right Upper Extremity Assessment RUE ROM/Strength/Tone: [facility] for tasks assessed Left Upper Extremity Assessment LUE  ROM/Strength/Tone: [facility] for tasks assessed Right Lower Extremity  Assessment RLE ROM/Strength/Tone: [facility] for tasks assessed Left Lower Extremity Assessment LLE ROM/Strength/Tone: [facility] for tasks assessed Trunk Assessment Trunk Assessment: Normal   Balance Balance Balance Assessed: Yes Static Sitting Balance Static Sitting - Balance Support: Left upper extremity supported;Right upper extremity supported;Feet supported Static Sitting - Level of Assistance: 5: Stand by assistance Static Sitting - Comment/# of Minutes: 5  End of Session PT - End of Session Equipment Utilized During Treatment: Gait belt Activity Tolerance: Patient tolerated treatment well Patient left: in bed;with call bell/phone within reach Nurse Communication: Mobility status  GP     Edrik Rundle, Eliseo Gum 05/27/2012, 5:45 PM  05/27/2012  [place] Bing, PT [phone] (pager)

## 2012-05-27 NOTE — ED Provider Notes (Signed)
I have personally seen and examined the patient.  I have discussed the plan of care with the resident.  I have reviewed the documentation on PMH/FH/Soc. History.  I have reviewed the documentation of the resident and agree.  Doug Sou, MD 05/27/12 1610

## 2012-05-27 NOTE — Progress Notes (Addendum)
[facility] PROGRESS NOTE Interim History: 78 year old female with past medical history of temporal arteritis currently on steroids, chronic kidney disease stage III. Went to the hospital for acute encephalopathy recently treated for UTI with a seven-day course of antibiotics. Admitted to the hospital and started on Rocephin leukocytosis is improving and also his mentation.   Assessment/Plan: Encephalopathy acute (05/26/2012)  -seems to be improved. Patient is alert x1. She is nonfocal and physical exam. Question if secondary to urinary tract infection versus primary pulmonary infection.  -We'll discuss with family her mental status.  -Check CT head, TSH, RBC folate, B12 .  Shortness of breath (05/27/2012) -she is satting 100% on 2 L yesterday when she was admitted she was 96 on room air. Her white count is resolved. Rocephin started on 05/26/2012. There is no appreciated wheezing on physical exam. She is not tachycardic. -She relates today that her shortness of breath is much improved. -There is no mention of cough, question of acute bronchitis.  UTI (lower urinary tract infection) (05/26/2012) -recently treated with antibiotics, urine cultures will be nonspecific. Her leukocytosis has resolved. She seems more alert today compared to yesterday's note   CKD (chronic kidney disease), stage III ()  Temporal arteritis (05/26/2012) -continue steroids.     Code Status: Full code Family Communication: anthony [phone] Disposition Plan: SNF   Consultants:  None   Procedures:  none  Antibiotics: Rocephin  HPI/Subjective: No complains  Objective: Filed Vitals:   05/26/12 2300 05/27/12 0405 05/27/12 0644 05/27/12 0851  BP: 158/101 160/105 162/108 152/98  Pulse:  84 72 85  Temp:  97.9 F (36.6 C)    TempSrc:  Oral    Resp:  16    Height:      Weight:      SpO2:  100%     No intake or output data in the 24 hours ending 05/27/12 0853 Filed Weights   05/26/12  2240  Weight: 62 kg (136 lb 11 oz)    Exam:  General: Alert, awake, oriented x3, in no acute distress.  HEENT: No bruits, no goiter.  Heart: Regular rate and rhythm, without murmurs, rubs, gallops.  Lungs: Good air movement, clear to auscultation.  Abdomen: Soft, nontender, nondistended, positive bowel sounds.  Neuro: Grossly intact, nonfocal.   Data Reviewed: Basic Metabolic Panel:  Lab 05/27/12 4782 05/26/12 1708 05/24/12 1809 05/24/12 1638 05/21/12 0043 05/20/12 2225  NA 136 133* 131* 126* -- 133*  K 4.5 5.5* 5.4* 7.3* 5.0 --  CL 102 98 99 92* -- 99  CO2 21 22 20 22  -- 23  GLUCOSE 121* 171* 151* 149* -- 211*  BUN 27* 33* 33* 35* -- 50*  CREATININE 1.51* 1.75* 1.59* 1.62* -- 2.24*  CALCIUM 9.2 10.3 9.4 10.1 -- 9.9  MG -- -- -- -- -- --  PHOS -- -- -- -- -- --   Liver Function Tests: No results found for this basename: AST:5,ALT:5,ALKPHOS:5,BILITOT:5,PROT:5,ALBUMIN:5 in the last 168 hours No results found for this basename: LIPASE:5,AMYLASE:5 in the last 168 hours No results found for this basename: AMMONIA:5 in the last 168 hours CBC:  Lab 05/27/12 0630 05/26/12 1708 05/24/12 1638 05/20/12 2225  WBC 9.2 11.7* 13.6* 11.4*  NEUTROABS -- -- -- 8.9*  HGB 13.5 14.2 14.1 14.5  HCT 39.1 41.7 40.3 41.8  MCV 95.1 96.5 93.9 95.9  PLT 132* 137* 172 161   Cardiac Enzymes:  Lab 05/26/12 1708 05/20/12 2225  CKTOTAL -- --  CKMB -- --  CKMBINDEX -- --  TROPONINI <0.30 <0.30   BNP (last 3 results) No results found for this basename: PROBNP:3 in the last 8760 hours CBG: No results found for this basename: GLUCAP:5 in the last 168 hours  Recent Results (from the past 240 hour(s))  GRAM STAIN     Status: Normal   Collection Time   05/26/12  4:25 PM      Component Value Range Status Comment   Specimen Description URINE, RANDOM   Final    Special Requests NONE   Final    Gram Stain     Final    Value: CYTOSPIN SLIDE     SQUAMOUS EPITHELIAL CELLS PRESENT     WBC  PRESENT,BOTH PMN AND MONONUCLEAR     NEGATIVE FOR BACTERIA   Report Status 05/26/2012 FINAL   Final      Studies: Dg Chest 2 View  05/26/2012  *RADIOLOGY REPORT*  Clinical Data: Chest pain and shortness of breath.  Altered mental status.  CHEST - 2 VIEW  Comparison: Chest x-ray 11/01/2011.  Findings: Lung volumes are normal.  No consolidative airspace disease.  No pleural effusions.  No pneumothorax.  No pulmonary nodule or mass noted.  Pulmonary vasculature and the cardiomediastinal silhouette are within normal limits.  IMPRESSION: 1. No radiographic evidence of acute cardiopulmonary disease.   Original Report Authenticated By: Florencia Reasons, M.D.     Scheduled Meds:    . azithromycin (ZITHROMAX) 500 MG IVPB  500 mg Intravenous Once  . brimonidine  1 drop Left Eye Q12H  . brinzolamide  1 drop Right Eye TID  . cefTRIAXone (ROCEPHIN)  IV  1 g Intravenous Once  . cefTRIAXone (ROCEPHIN)  IV  1 g Intravenous Q24H  . cloNIDine  0.1 mg Oral Once  . docusate sodium  100 mg Oral BID  . escitalopram  2.5 mg Oral Daily  . ezetimibe  10 mg Oral Daily  . Fluticasone-Salmeterol  1 puff Inhalation BID  . heparin  5,000 Units Subcutaneous Q8H  . levETIRAcetam  250 mg Oral Q12H  . predniSONE  10 mg Oral Q breakfast  . sodium polystyrene  15 g Oral Once  . timolol  1 drop Left Eye Q12H  . tiotropium  18 mcg Inhalation Daily  . DISCONTD: brimonidine-timolol  1 drop Left Eye Q12H  . DISCONTD: brimonidine-timolol  1 drop Left Eye Q12H   Continuous Infusions:    . sodium chloride 50 mL/hr at 05/27/12 0508     Marinda Elk  [facility] Pager [phone]. If 8PM-8AM, please contact night-coverage at [email or web address], password [facility] 05/27/2012, 8:53 AM  LOS: 1 day

## 2012-05-27 NOTE — Plan of Care (Signed)
Problem: Phase I Progression Outcomes Goal: Initial discharge plan identified Outcome: Completed/Met Date Met:  05/27/12 To return home with son when medically cleared. Son is requesting nurse aide on discharge order for [facility] already ordered.

## 2012-05-27 NOTE — Progress Notes (Signed)
Pt son states that pt is no longer taking Azopt eye drops for RT eye.  She takes regular eye drops such as, "Refresh".  Pharmacy [phone]) notified and PTA meds should be updated, per Pharmacy tech.

## 2012-05-28 LAB — BASIC METABOLIC PANEL WITH GFR
BUN: 22 mg/dL (ref 6–23)
BUN: 21 mg/dL (ref 6–23)
CO2: 21 meq/L (ref 19–32)
CO2: 20 meq/L (ref 19–32)
Calcium: 9 mg/dL (ref 8.4–10.5)
Calcium: 8.9 mg/dL (ref 8.4–10.5)
Chloride: 103 meq/L (ref 96–112)
Chloride: 102 meq/L (ref 96–112)
Creatinine, Ser: 1.37 mg/dL — ABNORMAL HIGH (ref 0.50–1.10)
Creatinine, Ser: 1.39 mg/dL — ABNORMAL HIGH (ref 0.50–1.10)
GFR calc Af Amer: 41 mL/min — ABNORMAL LOW
GFR calc Af Amer: 41 mL/min — ABNORMAL LOW
GFR calc non Af Amer: 36 mL/min — ABNORMAL LOW
GFR calc non Af Amer: 35 mL/min — ABNORMAL LOW
Glucose, Bld: 125 mg/dL — ABNORMAL HIGH (ref 70–99)
Glucose, Bld: 194 mg/dL — ABNORMAL HIGH (ref 70–99)
Potassium: 4.1 meq/L (ref 3.5–5.1)
Potassium: 4.1 meq/L (ref 3.5–5.1)
Sodium: 136 meq/L (ref 135–145)
Sodium: 135 meq/L (ref 135–145)

## 2012-05-28 LAB — URINE CULTURE: Colony Count: >=100000

## 2012-05-28 LAB — TSH: TSH: 1.614 u[IU]/mL (ref 0.350–4.500)

## 2012-05-28 LAB — VITAMIN B12: Vitamin B-12: 491 pg/mL (ref 211–911)

## 2012-05-28 MED ORDER — ALPRAZOLAM 0.25 MG PO TABS
0.2500 mg | ORAL_TABLET | Freq: Once | ORAL | Status: AC
  Administered 2012-05-28: 0.25 mg via ORAL
  Filled 2012-05-28: qty 1

## 2012-05-28 MED ORDER — CIPROFLOXACIN HCL 500 MG PO TABS
500.0000 mg | ORAL_TABLET | Freq: Two times a day (BID) | ORAL | Status: DC
  Administered 2012-05-28 (×2): 500 mg via ORAL
  Filled 2012-05-28 (×5): qty 1

## 2012-05-28 MED ORDER — BISACODYL 10 MG RE SUPP
10.0000 mg | Freq: Every day | RECTAL | Status: DC
  Administered 2012-05-28 – 2012-05-29 (×2): 10 mg via RECTAL
  Filled 2012-05-28 (×2): qty 1

## 2012-05-28 NOTE — Plan of Care (Signed)
Problem: Phase II Progression Outcomes Goal: Discharge plan established Outcome: Completed/Met Date Met:  05/28/12 Plan to discharge home when medically cleared, Pts children to assist as needed.

## 2012-05-28 NOTE — Progress Notes (Signed)
[facility] PROGRESS NOTE Interim History: 78 year old female with past medical history of temporal arteritis currently on steroids, chronic kidney disease stage III. Went to the hospital for acute encephalopathy recently treated for UTI with a seven-day course of antibiotics. Admitted to the hospital and started on Rocephin leukocytosis is improving and also his mentation.   Assessment/Plan: Encephalopathy acute (05/26/2012) -Patient is alert x2. She is nonfocal and physical exam. Question if secondary to urinary tract infection -We'll discuss with family her mental status.   UTI (lower urinary tract infection) (05/26/2012) -recently treated with antibiotics, urine cultures will be nonspecific. Her leukocytosis has resolved. She seems more alert today compared to yesterday's note -start cipro 05/28/2012  CKD (chronic kidney disease), stage III () -stable.   Temporal arteritis (05/26/2012) -continue steroids.     Code Status: Full code Family Communication: anthony [phone] Disposition Plan: SNF   Consultants:  None   Procedures:  none  Antibiotics: Rocephin  HPI/Subjective: No complains  Objective: Filed Vitals:   05/27/12 1132 05/27/12 1441 05/27/12 2056 05/28/12 0529  BP: 125/85 144/99 138/86 150/95  Pulse: 76 79 70 81  Temp:  98.3 F (36.8 C) 97.5 F (36.4 C) 97.6 F (36.4 C)  TempSrc:  Oral Oral Oral  Resp:  18 17 17   Height:      Weight:      SpO2:  98% 97% 99%    Intake/Output Summary (Last 24 hours) at 05/28/12 0806 Last data filed at 05/28/12 0600  Gross per 24 hour  Intake 2068.33 ml  Output      0 ml  Net 2068.33 ml   Filed Weights   05/26/12 2240  Weight: 62 kg (136 lb 11 oz)    Exam:  General: Alert, awake, oriented x2, in no acute distress.  HEENT: No bruits, no goiter.  Heart: Regular rate and rhythm, without murmurs, rubs, gallops.  Lungs: Good air movement, clear to auscultation.  Abdomen: Soft, nontender,  nondistended, positive bowel sounds.  Neuro: Grossly intact, nonfocal.   Data Reviewed: Basic Metabolic Panel:  Lab 05/27/12 8657 05/26/12 1708 05/24/12 1809 05/24/12 1638  NA 136 133* 131* 126*  K 4.5 5.5* 5.4* 7.3*  CL 102 98 99 92*  CO2 21 22 20 22   GLUCOSE 121* 171* 151* 149*  BUN 27* 33* 33* 35*  CREATININE 1.51* 1.75* 1.59* 1.62*  CALCIUM 9.2 10.3 9.4 10.1  MG -- -- -- --  PHOS -- -- -- --   Liver Function Tests: No results found for this basename: AST:5,ALT:5,ALKPHOS:5,BILITOT:5,PROT:5,ALBUMIN:5 in the last 168 hours No results found for this basename: LIPASE:5,AMYLASE:5 in the last 168 hours No results found for this basename: AMMONIA:5 in the last 168 hours CBC:  Lab 05/27/12 0630 05/26/12 1708 05/24/12 1638  WBC 9.2 11.7* 13.6*  NEUTROABS -- -- --  HGB 13.5 14.2 14.1  HCT 39.1 41.7 40.3  MCV 95.1 96.5 93.9  PLT 132* 137* 172   Cardiac Enzymes:  Lab 05/26/12 1708  CKTOTAL --  CKMB --  CKMBINDEX --  TROPONINI <0.30   BNP (last 3 results) No results found for this basename: PROBNP:3 in the last 8760 hours CBG: No results found for this basename: GLUCAP:5 in the last 168 hours  Recent Results (from the past 240 hour(s))  GRAM STAIN     Status: Normal   Collection Time   05/26/12  4:25 PM      Component Value Range Status Comment   Specimen Description URINE, RANDOM   Final  Special Requests NONE   Final    Gram Stain     Final    Value: CYTOSPIN SLIDE     SQUAMOUS EPITHELIAL CELLS PRESENT     WBC PRESENT,BOTH PMN AND MONONUCLEAR     NEGATIVE FOR BACTERIA   Report Status 05/26/2012 FINAL   Final      Studies: Dg Chest 2 View  05/26/2012  *RADIOLOGY REPORT*  Clinical Data: Chest pain and shortness of breath.  Altered mental status.  CHEST - 2 VIEW  Comparison: Chest x-ray 11/01/2011.  Findings: Lung volumes are normal.  No consolidative airspace disease.  No pleural effusions.  No pneumothorax.  No pulmonary nodule or mass noted.  Pulmonary  vasculature and the cardiomediastinal silhouette are within normal limits.  IMPRESSION: 1. No radiographic evidence of acute cardiopulmonary disease.   Original Report Authenticated By: Florencia Reasons, M.D.    Dg Abd 1 View  05/27/2012  *RADIOLOGY REPORT*  Clinical Data: Abdominal pain.  ABDOMEN - 1 VIEW  Comparison: 05/24/2012  Findings: Multiple gas filled, nondilated small bowel loops are seen mainly in the left abdomen.  There is also gas and moderate stool burden is seen throughout the entire colon.  No evidence of bowel obstruction. Multiple pelvic phleboliths are identified, but no definite radiopaque calculi visualized.  IMPRESSION: Nonspecific, nonobstructive bowel gas pattern.  Moderate stool burden noted.   Original Report Authenticated By: Danae Orleans, M.D.    Ct Head Wo Contrast  05/27/2012  *RADIOLOGY REPORT*  Clinical Data: Encephalopathy.  CT HEAD WITHOUT CONTRAST  Technique:  Contiguous axial images were obtained from the base of the skull through the vertex without contrast.  Comparison: CT head 02/16/2010.  Findings: No acute cortical infarct, hemorrhage, mass lesion is present.  The ventricles are of normal size.  No significant extra- axial fluid collection is present.  The paranasal sinuses and mastoid air cells are clear. Atherosclerotic calcifications are present within the cavernous carotid arteries.  IMPRESSION: Negative CT of the head.   Original Report Authenticated By: Jamesetta Orleans. MATTERN, M.D.     Scheduled Meds:    . ALPRAZolam  0.25 mg Oral Once  . brimonidine  1 drop Left Eye Q12H  . brinzolamide  1 drop Right Eye TID  . cefTRIAXone (ROCEPHIN)  IV  1 g Intravenous Q24H  . cloNIDine  0.1 mg Oral Once  . docusate sodium  100 mg Oral BID  . escitalopram  2.5 mg Oral Daily  . ezetimibe  10 mg Oral Daily  . Fluticasone-Salmeterol  1 puff Inhalation BID  . heparin  5,000 Units Subcutaneous Q8H  . levETIRAcetam  250 mg Oral Q12H  . polyethylene glycol  17 g  Oral Daily  . predniSONE  10 mg Oral Q breakfast  . timolol  1 drop Left Eye Q12H  . tiotropium  18 mcg Inhalation Daily   Continuous Infusions:    . sodium chloride 75 mL/hr at 05/27/12 2258     Marinda Elk  [facility] Pager [phone]. If 8PM-8AM, please contact night-coverage at [email or web address], password [facility] 05/28/2012, 8:06 AM  LOS: 2 days

## 2012-05-29 DIAGNOSIS — M316 Other giant cell arteritis: Secondary | ICD-10-CM

## 2012-05-29 LAB — BASIC METABOLIC PANEL
BUN: 19 mg/dL (ref 6–23)
CO2: 18 mEq/L — ABNORMAL LOW (ref 19–32)
Chloride: 99 mEq/L (ref 96–112)
GFR calc Af Amer: 38 mL/min — ABNORMAL LOW (ref >90)
Potassium: 4.2 mEq/L (ref 3.5–5.1)

## 2012-05-29 MED ORDER — SODIUM CHLORIDE 0.9 % IV BOLUS (SEPSIS)
500.0000 mL | Freq: Once | INTRAVENOUS | Status: AC
  Administered 2012-05-29: 500 mL via INTRAVENOUS

## 2012-05-29 MED ORDER — CIPROFLOXACIN HCL 500 MG PO TABS: 500.0000 mg | ORAL_TABLET | Freq: Every day | ORAL | Status: DC

## 2012-05-29 MED ORDER — CIPROFLOXACIN HCL 500 MG PO TABS: 500.0000 mg | ORAL_TABLET | Freq: Two times a day (BID) | ORAL | Status: DC

## 2012-05-29 NOTE — Care Management Note (Addendum)
    Page 1 of 2   05/30/2012     2:10:27 PM   CARE MANAGEMENT NOTE 05/30/2012  Patient:  DIM, MEISINGER   Account Number:  [id number]  Date Initiated:  05/29/2012  Documentation initiated by:  Letha Cape  Subjective/Objective Assessment:   dx acute encephalopathy, uti, ckd  admit as observation- lives with son who provides 24 hr care. Patient has has [facility] in the past.     Action/Plan:   pt eval- recs hhpt/ot   Anticipated DC Date:  05/29/2012   Anticipated DC Plan:  HOME W HOME HEALTH SERVICES      DC Planning Services  CM consult      [facility] Choice  HOME HEALTH   Choice offered to / List presented to:  C-4 Adult Children        HH arranged  HH-1 RN  HH-2 PT  HH-3 OT  HH-4 NURSE'S AIDE  HH-6 SOCIAL WORKER      HH agency  [facility]   Status of service:  Completed, signed off Medicare Important Message given?   (If response is "NO", the following Medicare IM given date fields will be blank) Date Medicare IM given:   Date Additional Medicare IM given:    Discharge Disposition:  HOME W HOME HEALTH SERVICES  Per UR Regulation:  Reviewed for med. necessity/level of care/duration of stay  If discussed at Long Length of Stay Meetings, dates discussed:    Comments:  05/29/12 11:26 Letha Cape RN, BSN [phone] patient lives with son, whol provides 24 hr care for patient, he states patient has a rolling walker, a wheel chair and a cane at home.  Physical therapy states pt is a mod ast and had to have two person ast to get pt back to chair, explained this to son, he states he can take care of patient at home and they would like to work with [facility] who they have had in the past.  Referral made to [facility] for [facility], PT, OT, Aide and Social Worker,Mary notified. Patient for dc today,  soc will begin 24-48 hrs post discharge.  [facility] does not work with [facility], patient chose [facility] , referral made to [facility] for hhrn, pt, ot, aide and csw.  [facility] states  someone will be out to see patient on 10/16.

## 2012-05-29 NOTE — Discharge Summary (Signed)
Physician Discharge Summary  [name] RUE:[id number] DOB: 10/01/1930 DOA: 05/26/2012  PCP: [name], MD  Admit date: 05/26/2012 Discharge date: 05/29/2012  Recommendations for Outpatient Follow-up:  1. Followup with her primary care doctor in 2 weeks will see how her blood pressure doing titrate blood pressure medications as tolerated  Discharge Diagnoses:  Principal Problem:  *Encephalopathy acute Active Problems:  Monoclonal gammopathy  Cataracts, bilateral  CKD (chronic kidney disease), stage III  Hyperlipidemia  UTI (lower urinary tract infection)  Temporal arteritis  H/O idiopathic seizure  Shortness of breath   Discharge Condition: Stable  Diet recommendation: Heart healthy diet  Filed Weights   05/26/12 2240  Weight: 62 kg (136 lb 11 oz)    History of present illness:  78 year old female who was recently in the ER with a diagnosis of confusion. She was diagnosed with a UTI and discharged home on 7 days of cephalexin. Per family her confusion has never improved and has gotten worse daily. Today at home she was very confused, dizzy and short of breath. She felt presyncopal. To me the patient denies any chest pains. On further questioning, patient's dizziness and shortness of breath is not new, this is been ongoing and worsening for the past 3 years. The patient does currently have a low-grade fever, intermittent nausea and intermittent burning urination.   The patient lives at home with family members who smoke. The patient does report a frequent wheezing. There is some reports of her using nebulizers in the remote past, but this is also has been discontinued. The patient does appear to minimally ambulatory, able to get from the bedroom to the living room with assistance and with great personal effort. She often gets winded and lightheaded in the process.   This is her third visit to the ER, the hospitalist service was requested to admit. History provided by  the patient and her 2 sons at bedside. Patient has been constipated for a week.  Hospital Course:  Encephalopathy acute (05/26/2012): -Was likely secondary to her urinary tract infection, significantly confused admission. That improved with treatment of her infection.  -Patient is alert x3. She is nonfocal and physical exam.  -Physical therapy evaluated her and recommended 24-hour supervision at home.  UTI (lower urinary tract infection) (05/26/2012): -Start empirically on Rocephin. -recently treated with antibiotics, urine cultures will be nonspecific. Her leukocytosis resolved.  -start cipro 05/28/2012 we'll complete a seven-day course .  CKD (chronic kidney disease), stage III () -stable.   Temporal arteritis (05/26/2012) -continue steroids.   Procedures:  None (i.e. Studies not automatically included, echos, thoracentesis, etc; not x-rays)  Consultations:  None  Discharge Exam: Filed Vitals:   05/28/12 0529 05/28/12 1437 05/28/12 2135 05/29/12 0627  BP: 150/95 151/91 141/92 157/115  Pulse: 81 83 72 96  Temp: 97.6 F (36.4 C) 98 F (36.7 C) 98.3 F (36.8 C) 97.3 F (36.3 C)  TempSrc: Oral Oral Oral Oral  Resp: 17 18 20 18   Height:      Weight:      SpO2: 99% 98% 99% 100%    General: Awake alert and oriented x3 Cardiovascular: Regular rate and rhythm Respiratory: Good air movement clear to auscultation  Discharge Instructions  Discharge Orders    Future Appointments: Provider: Department: Dept Phone: Center:   06/05/2012 12:45 PM Beverely Pace Shumate [facility] [phone] None   06/05/2012 1:15 PM Amy Allegra Grana, PA [facility] [phone] None     Future Orders Please Complete By Expires  Diet - low sodium heart healthy      Increase activity slowly          Medication List     As of 05/29/2012  8:50 AM    TAKE these medications         brinzolamide 1 % ophthalmic suspension   Commonly known as: AZOPT   Place 1 drop into the right eye  3 (three) times daily.      Calcium 500/D 500-125 MG-UNIT Tabs   Generic drug: Calcium Carbonate-Vitamin D   Take 1 tablet by mouth 2 (two) times daily.      ciprofloxacin 500 MG tablet   Commonly known as: CIPRO   Take 1 tablet (500 mg total) by mouth 2 (two) times daily.      COMBIGAN 0.2-0.5 % ophthalmic solution   Generic drug: brimonidine-timolol   Place 1 drop into the left eye every 12 (twelve) hours.      diclofenac sodium 1 % Gel   Commonly known as: VOLTAREN   Apply 1 application topically 2 (two) times daily as needed. For pain      escitalopram 5 MG tablet   Commonly known as: LEXAPRO   Take 2.5 mg by mouth daily.      ezetimibe 10 MG tablet   Commonly known as: ZETIA   Take 10 mg by mouth daily.      levETIRAcetam 250 MG tablet   Commonly known as: KEPPRA   Take 250 mg by mouth every 12 (twelve) hours.      predniSONE 10 MG tablet   Commonly known as: DELTASONE   Take 10 mg by mouth daily.      traMADol 50 MG tablet   Commonly known as: ULTRAM   Take 50 mg by mouth daily as needed. For pain           Follow-up Information    Follow up with [name], MD.   Contact information:   [address] [place] [address] [phone]           The results of significant diagnostics from this hospitalization (including imaging, microbiology, ancillary and laboratory) are listed below for reference.    Significant Diagnostic Studies: Dg Chest 2 View  05/26/2012  *RADIOLOGY REPORT*  Clinical Data: Chest pain and shortness of breath.  Altered mental status.  CHEST - 2 VIEW  Comparison: Chest x-ray 11/01/2011.  Findings: Lung volumes are normal.  No consolidative airspace disease.  No pleural effusions.  No pneumothorax.  No pulmonary nodule or mass noted.  Pulmonary vasculature and the cardiomediastinal silhouette are within normal limits.  IMPRESSION: 1. No radiographic evidence of acute cardiopulmonary disease.   Original Report Authenticated  By: Florencia Reasons, M.D.    Dg Abd 1 View  05/27/2012  *RADIOLOGY REPORT*  Clinical Data: Abdominal pain.  ABDOMEN - 1 VIEW  Comparison: 05/24/2012  Findings: Multiple gas filled, nondilated small bowel loops are seen mainly in the left abdomen.  There is also gas and moderate stool burden is seen throughout the entire colon.  No evidence of bowel obstruction. Multiple pelvic phleboliths are identified, but no definite radiopaque calculi visualized.  IMPRESSION: Nonspecific, nonobstructive bowel gas pattern.  Moderate stool burden noted.   Original Report Authenticated By: Danae Orleans, M.D.    Dg Abd 1 View  05/24/2012  *RADIOLOGY REPORT*  Clinical Data: Abdominal pain  ABDOMEN - 1 VIEW  Comparison: None.  Findings: Normal bowel gas pattern.  No obstruction.  There  are multiple phleboliths in the central lower abdomen and pelvis.  The abdominal and pelvic soft tissues are otherwise unremarkable.  The bones are demineralized.  There are degenerative changes of the lower lumbar spine.  The lung bases are clear.  IMPRESSION: No acute findings.  No evidence of obstruction.   Original Report Authenticated By: Domenic Moras, M.D.    Ct Head Wo Contrast  05/27/2012  *RADIOLOGY REPORT*  Clinical Data: Encephalopathy.  CT HEAD WITHOUT CONTRAST  Technique:  Contiguous axial images were obtained from the base of the skull through the vertex without contrast.  Comparison: CT head 02/16/2010.  Findings: No acute cortical infarct, hemorrhage, mass lesion is present.  The ventricles are of normal size.  No significant extra- axial fluid collection is present.  The paranasal sinuses and mastoid air cells are clear. Atherosclerotic calcifications are present within the cavernous carotid arteries.  IMPRESSION: Negative CT of the head.   Original Report Authenticated By: Jamesetta Orleans. MATTERN, M.D.     Microbiology: Recent Results (from the past 240 hour(s))  URINE CULTURE     Status: Normal   Collection Time    05/26/12  4:25 PM      Component Value Range Status Comment   Specimen Description URINE, RANDOM   Final    Special Requests NONE   Final    Culture  Setup Time 05/26/2012 18:25   Final    Colony Count >=100,000 COLONIES/ML   Final    Culture PSEUDOMONAS AERUGINOSA   Final    Report Status 05/28/2012 FINAL   Final    Organism ID, Bacteria PSEUDOMONAS AERUGINOSA   Final   GRAM STAIN     Status: Normal   Collection Time   05/26/12  4:25 PM      Component Value Range Status Comment   Specimen Description URINE, RANDOM   Final    Special Requests NONE   Final    Gram Stain     Final    Value: CYTOSPIN SLIDE     SQUAMOUS EPITHELIAL CELLS PRESENT     WBC PRESENT,BOTH PMN AND MONONUCLEAR     NEGATIVE FOR BACTERIA   Report Status 05/26/2012 FINAL   Final   CULTURE, BLOOD (ROUTINE X 2)     Status: Normal (Preliminary result)   Collection Time   05/26/12  8:45 PM      Component Value Range Status Comment   Specimen Description BLOOD HAND RIGHT   Final    Special Requests BOTTLES DRAWN AEROBIC AND ANAEROBIC 10CC   Final    Culture  Setup Time 05/27/2012 04:50   Final    Culture     Final    Value:        BLOOD CULTURE RECEIVED NO GROWTH TO DATE CULTURE WILL BE HELD FOR 5 DAYS BEFORE ISSUING A FINAL NEGATIVE REPORT   Report Status PENDING   Incomplete   CULTURE, BLOOD (ROUTINE X 2)     Status: Normal (Preliminary result)   Collection Time   05/26/12  8:55 PM      Component Value Range Status Comment   Specimen Description BLOOD ARM RIGHT   Final    Special Requests BOTTLES DRAWN AEROBIC AND ANAEROBIC 10CC   Final    Culture  Setup Time 05/27/2012 04:50   Final    Culture     Final    Value:        BLOOD CULTURE RECEIVED NO GROWTH TO DATE CULTURE WILL BE HELD FOR  5 DAYS BEFORE ISSUING A FINAL NEGATIVE REPORT   Report Status PENDING   Incomplete      Labs: Basic Metabolic Panel:  Lab 05/29/12 3086 05/28/12 0920 05/28/12 0700 05/27/12 0630 05/26/12 1708  NA 133* 135 136 136 133*    K 4.2 4.1 4.1 4.5 5.5*  CL 99 102 103 102 98  CO2 18* 20 21 21 22   GLUCOSE 156* 194* 125* 121* 171*  BUN 19 21 22  27* 33*  CREATININE 1.48* 1.39* 1.37* 1.51* 1.75*  CALCIUM 9.3 8.9 9.0 9.2 10.3  MG -- -- -- -- --  PHOS -- -- -- -- --   Liver Function Tests: No results found for this basename: AST:5,ALT:5,ALKPHOS:5,BILITOT:5,PROT:5,ALBUMIN:5 in the last 168 hours No results found for this basename: LIPASE:5,AMYLASE:5 in the last 168 hours No results found for this basename: AMMONIA:5 in the last 168 hours CBC:  Lab 05/27/12 0630 05/26/12 1708 05/24/12 1638  WBC 9.2 11.7* 13.6*  NEUTROABS -- -- --  HGB 13.5 14.2 14.1  HCT 39.1 41.7 40.3  MCV 95.1 96.5 93.9  PLT 132* 137* 172   Cardiac Enzymes:  Lab 05/26/12 1708  CKTOTAL --  CKMB --  CKMBINDEX --  TROPONINI <0.30   BNP: BNP (last 3 results) No results found for this basename: PROBNP:3 in the last 8760 hours CBG: No results found for this basename: GLUCAP:5 in the last 168 hours  Time coordinating discharge: *40 minute minutes  Signed:  Marinda Elk  [facility] 05/29/2012, 8:50 AM

## 2012-05-29 NOTE — Progress Notes (Signed)
Physical Therapy Treatment Patient Details Name: [name] MRN: [id number] DOB: Jun 04, 1931 Today's Date: 05/29/2012 Time: 0865-7846 PT Time Calculation (min): 27 min  PT Assessment / Plan / Recommendation Comments on Treatment Session  Pt admitted with UTI and AMS and continues to demonstrate significant weakness requiring at least max assist for transfers. Pt states she could walk to bathroom at home and son [name] contacted in room by case manager during session and educated for extensive assist pt is currently requiring and son stated he would be available 24hrs/day and assist with mobility at this higher level. Nursing staff aware of mobility and assist required. Will continue to follow.     Follow Up Recommendations  Supervision/Assistance - 24 hour;Home health PT     Does the patient have the potential to tolerate intense rehabilitation     Barriers to Discharge        Equipment Recommendations       Recommendations for Other Services    Frequency     Plan Discharge plan remains appropriate;Frequency remains appropriate    Precautions / Restrictions Precautions Precautions: Fall Restrictions Weight Bearing Restrictions: No   Pertinent Vitals/Pain 5/10 generalized arthritic pain    Mobility  Bed Mobility Supine to Sit: 2: Max assist;HOB flat;With rails Sitting - Scoot to Edge of Bed: 2: Max assist Details for Bed Mobility Assistance: Pt with HOB elevated on arrival and pt maintained long sitting with HOB lowered and max cueing for scooting to EOB and pivoting legs to EOB. Pt attempting to initiate scooting but unable to advance sacrum without max assist of pad and reciprocal scooting.  Transfers Transfers: Sit to Stand;Stand to Sit;Stand Pivot Transfers Sit to Stand: 2: Max assist;From bed Stand to Sit: 2: Max assist;To chair/3-in-1 Stand Pivot Transfers: 2: Max assist;1: +2 Total assist Stand Pivot Transfers: Patient Percentage: 30% Details for Transfer  Assistance: max assist to stand from bed with cueing and assist for anterior translation as pt with maintained hip flexion and posterior lean with max assist to pivot bed to [facility]. On return pivot BSC to recliner +2 assist for safety and manuevering equipment with assist for pericare as pt unable to perform in sitting or standing. +2 assist with pad to scoot back in chair. Ambulation/Gait Ambulation/Gait Assistance: Not tested (comment)    Exercises General Exercises - Lower Extremity Short Arc Quad: AROM;Both;10 reps;Seated Hip Flexion/Marching: AAROM;Both;10 reps;Seated   PT Diagnosis:    PT Problem List:   PT Treatment Interventions:     PT Goals Acute Rehab PT Goals PT Goal: Supine/Side to Sit - Progress: Not progressing PT Goal: Sit to Stand - Progress: Not progressing PT Transfer Goal: Bed to Chair/Chair to Bed - Progress: Not progressing PT Goal: Ambulate - Progress: Not progressing  Visit Information  Last PT Received On: 05/29/12 Assistance Needed: +2    Subjective Data  Subjective: 'I have been walking in the house with my walker"   Cognition  Overall Cognitive Status: Impaired Area of Impairment: Safety/judgement Arousal/Alertness: Awake/alert Orientation Level: Appears intact for tasks assessed Behavior During Session: Flat affect Safety/Judgement: Decreased awareness of need for assistance    Balance     End of Session PT - End of Session Equipment Utilized During Treatment: Gait belt Activity Tolerance: Patient limited by fatigue Patient left: in chair;with call bell/phone within reach Nurse Communication: Mobility status   GP     Toney Sang Beth 05/29/2012, 11:12 AM Delaney Meigs, PT [phone]

## 2012-05-29 NOTE — Progress Notes (Signed)
NURSING PROGRESS NOTE  [name] [id number] Discharge Data: 05/29/2012 3:44 PM Attending Provider: Marinda Elk, MD WUJ:[id number],WGNFA A, MD     Ayesha Mohair to be D/C'd Home per MD order.  Discussed with the daughter the After Visit Summary and all questions fully answered. All IV's discontinued with no bleeding noted. All belongings returned to patient for patient to take home.   Last Vital Signs:  Blood pressure 144/89, pulse 56, temperature 98.3 F (36.8 C), temperature source Oral, resp. rate 20, height 5\' 1"  (1.549 m), weight 62 kg (136 lb 11 oz), SpO2 95.00%.  Discharge Medication List   Medication List     As of 05/29/2012  3:44 PM    TAKE these medications         brinzolamide 1 % ophthalmic suspension   Commonly known as: AZOPT   Place 1 drop into the right eye 3 (three) times daily.      Calcium 500/D 500-125 MG-UNIT Tabs   Generic drug: Calcium Carbonate-Vitamin D   Take 1 tablet by mouth 2 (two) times daily.      ciprofloxacin 500 MG tablet   Commonly known as: CIPRO   Take 1 tablet (500 mg total) by mouth 2 (two) times daily.      COMBIGAN 0.2-0.5 % ophthalmic solution   Generic drug: brimonidine-timolol   Place 1 drop into the left eye every 12 (twelve) hours.      diclofenac sodium 1 % Gel   Commonly known as: VOLTAREN   Apply 1 application topically 2 (two) times daily as needed. For pain      escitalopram 5 MG tablet   Commonly known as: LEXAPRO   Take 2.5 mg by mouth daily.      ezetimibe 10 MG tablet   Commonly known as: ZETIA   Take 10 mg by mouth daily.      levETIRAcetam 250 MG tablet   Commonly known as: KEPPRA   Take 250 mg by mouth every 12 (twelve) hours.      predniSONE 10 MG tablet   Commonly known as: DELTASONE   Take 10 mg by mouth daily.      traMADol 50 MG tablet   Commonly known as: ULTRAM   Take 50 mg by mouth daily as needed. For pain        Jovaun Levene, Elmarie Mainland, RN

## 2012-05-30 NOTE — Progress Notes (Signed)
Late Entry-- PT Evaluation Addendum   06-18-2012 1729  PT G-Codes **NOT FOR INPATIENT CLASS**  Functional Assessment Tool Used clinical judgement  Functional Limitation Mobility: Walking and moving around  Mobility: Walking and Moving Around Current Status (Z6109) CI  Mobility: Walking and Moving Around Goal Status (U0454) CH  PT General Charges  $$ ACUTE PT VISIT 1 Procedure  PT Evaluation  $Initial PT Evaluation Tier I 1 Procedure  PT Treatments  $Gait Training 8-22 mins  $Therapeutic Activity 8-22 mins   05/30/2012  Benton Bing, PT [phone] (pager)

## 2012-06-02 LAB — CULTURE, BLOOD (ROUTINE X 2): Culture: NO GROWTH

## 2012-06-04 ENCOUNTER — Inpatient Hospital Stay (HOSPITAL_COMMUNITY)
Admission: EM | Admit: 2012-06-04 | Discharge: 2012-06-14 | DRG: 280 | Disposition: A | Discharge status: Skilled Nursing Facility | Payer: Medicare Other | Attending: Critical Care Medicine | Admitting: Critical Care Medicine

## 2012-06-04 ENCOUNTER — Encounter (HOSPITAL_COMMUNITY): Payer: Self-pay | Admitting: Critical Care Medicine

## 2012-06-04 ENCOUNTER — Emergency Department (HOSPITAL_COMMUNITY): Payer: Medicare Other

## 2012-06-04 ENCOUNTER — Other Ambulatory Visit (HOSPITAL_COMMUNITY): Payer: Medicare Other

## 2012-06-04 ENCOUNTER — Inpatient Hospital Stay (HOSPITAL_COMMUNITY): Payer: Medicare Other

## 2012-06-04 DIAGNOSIS — H409 Unspecified glaucoma: Secondary | ICD-10-CM

## 2012-06-04 DIAGNOSIS — E872 Acidosis, unspecified: Secondary | ICD-10-CM | POA: Diagnosis present

## 2012-06-04 DIAGNOSIS — I959 Hypotension, unspecified: Secondary | ICD-10-CM

## 2012-06-04 DIAGNOSIS — IMO0002 Reserved for concepts with insufficient information to code with codable children: Secondary | ICD-10-CM

## 2012-06-04 DIAGNOSIS — J9600 Acute respiratory failure, unspecified whether with hypoxia or hypercapnia: Secondary | ICD-10-CM | POA: Diagnosis present

## 2012-06-04 DIAGNOSIS — G40909 Epilepsy, unspecified, not intractable, without status epilepticus: Secondary | ICD-10-CM | POA: Diagnosis present

## 2012-06-04 DIAGNOSIS — Z87898 Personal history of other specified conditions: Secondary | ICD-10-CM

## 2012-06-04 DIAGNOSIS — I248 Other forms of acute ischemic heart disease: Secondary | ICD-10-CM | POA: Diagnosis present

## 2012-06-04 DIAGNOSIS — R0602 Shortness of breath: Secondary | ICD-10-CM

## 2012-06-04 DIAGNOSIS — G9340 Encephalopathy, unspecified: Secondary | ICD-10-CM

## 2012-06-04 DIAGNOSIS — I4891 Unspecified atrial fibrillation: Secondary | ICD-10-CM

## 2012-06-04 DIAGNOSIS — R4182 Altered mental status, unspecified: Secondary | ICD-10-CM

## 2012-06-04 DIAGNOSIS — K8020 Calculus of gallbladder without cholecystitis without obstruction: Secondary | ICD-10-CM

## 2012-06-04 DIAGNOSIS — D696 Thrombocytopenia, unspecified: Secondary | ICD-10-CM | POA: Diagnosis present

## 2012-06-04 DIAGNOSIS — N1330 Unspecified hydronephrosis: Secondary | ICD-10-CM

## 2012-06-04 DIAGNOSIS — M316 Other giant cell arteritis: Secondary | ICD-10-CM

## 2012-06-04 DIAGNOSIS — J9690 Respiratory failure, unspecified, unspecified whether with hypoxia or hypercapnia: Secondary | ICD-10-CM

## 2012-06-04 DIAGNOSIS — D472 Monoclonal gammopathy: Secondary | ICD-10-CM

## 2012-06-04 DIAGNOSIS — I129 Hypertensive chronic kidney disease with stage 1 through stage 4 chronic kidney disease, or unspecified chronic kidney disease: Secondary | ICD-10-CM | POA: Diagnosis present

## 2012-06-04 DIAGNOSIS — I2510 Atherosclerotic heart disease of native coronary artery without angina pectoris: Secondary | ICD-10-CM | POA: Diagnosis present

## 2012-06-04 DIAGNOSIS — N289 Disorder of kidney and ureter, unspecified: Secondary | ICD-10-CM

## 2012-06-04 DIAGNOSIS — H269 Unspecified cataract: Secondary | ICD-10-CM

## 2012-06-04 DIAGNOSIS — N183 Chronic kidney disease, stage 3 unspecified: Secondary | ICD-10-CM

## 2012-06-04 DIAGNOSIS — N390 Urinary tract infection, site not specified: Secondary | ICD-10-CM

## 2012-06-04 DIAGNOSIS — I469 Cardiac arrest, cause unspecified: Principal | ICD-10-CM

## 2012-06-04 DIAGNOSIS — R5381 Other malaise: Secondary | ICD-10-CM | POA: Diagnosis present

## 2012-06-04 DIAGNOSIS — Z8673 Personal history of transient ischemic attack (TIA), and cerebral infarction without residual deficits: Secondary | ICD-10-CM

## 2012-06-04 DIAGNOSIS — E876 Hypokalemia: Secondary | ICD-10-CM

## 2012-06-04 DIAGNOSIS — I214 Non-ST elevation (NSTEMI) myocardial infarction: Secondary | ICD-10-CM

## 2012-06-04 DIAGNOSIS — I2489 Other forms of acute ischemic heart disease: Secondary | ICD-10-CM | POA: Diagnosis present

## 2012-06-04 DIAGNOSIS — E875 Hyperkalemia: Secondary | ICD-10-CM | POA: Diagnosis present

## 2012-06-04 DIAGNOSIS — R739 Hyperglycemia, unspecified: Secondary | ICD-10-CM

## 2012-06-04 DIAGNOSIS — R195 Other fecal abnormalities: Secondary | ICD-10-CM | POA: Diagnosis present

## 2012-06-04 DIAGNOSIS — I10 Essential (primary) hypertension: Secondary | ICD-10-CM

## 2012-06-04 DIAGNOSIS — E785 Hyperlipidemia, unspecified: Secondary | ICD-10-CM

## 2012-06-04 DIAGNOSIS — Z66 Do not resuscitate: Secondary | ICD-10-CM | POA: Diagnosis present

## 2012-06-04 DIAGNOSIS — N179 Acute kidney failure, unspecified: Secondary | ICD-10-CM

## 2012-06-04 DIAGNOSIS — Z8739 Personal history of other diseases of the musculoskeletal system and connective tissue: Secondary | ICD-10-CM

## 2012-06-04 HISTORY — DX: Hypo-osmolality and hyponatremia: E87.1

## 2012-06-04 HISTORY — DX: Other giant cell arteritis: M31.6

## 2012-06-04 HISTORY — DX: Cerebrovascular disease, unspecified: I67.9

## 2012-06-04 LAB — CBC WITH DIFFERENTIAL/PLATELET
Basophils Absolute: 0 K/uL (ref 0.0–0.1)
Basophils Relative: 0 % (ref 0–1)
Eosinophils Absolute: 0.1 10*3/uL (ref 0.0–0.7)
Eosinophils Relative: 1 % (ref 0–5)
HCT: 45.4 % (ref 36.0–46.0)
Hemoglobin: 15.4 g/dL — ABNORMAL HIGH (ref 12.0–15.0)
Lymphocytes Relative: 56 % — ABNORMAL HIGH (ref 12–46)
Lymphs Abs: 6.1 10*3/uL — ABNORMAL HIGH (ref 0.7–4.0)
MCH: 33.7 pg (ref 26.0–34.0)
MCHC: 33.9 g/dL (ref 30.0–36.0)
MCV: 99.3 fL (ref 78.0–100.0)
Monocytes Absolute: 0.6 10*3/uL (ref 0.1–1.0)
Monocytes Relative: 6 % (ref 3–12)
Neutro Abs: 4 10*3/uL (ref 1.7–7.7)
Neutrophils Relative %: 37 % — ABNORMAL LOW (ref 43–77)
Platelets: 90 K/uL — ABNORMAL LOW (ref 150–400)
RBC: 4.57 MIL/uL (ref 3.87–5.11)
RDW: 14.1 % (ref 11.5–15.5)
WBC: 10.8 K/uL — ABNORMAL HIGH (ref 4.0–10.5)

## 2012-06-04 LAB — URINALYSIS, ROUTINE W REFLEX MICROSCOPIC
Bilirubin Urine: NEGATIVE
Glucose, UA: NEGATIVE mg/dL
Ketones, ur: NEGATIVE mg/dL
Leukocytes, UA: NEGATIVE
Nitrite: NEGATIVE
Protein, ur: 100 mg/dL — AB
Specific Gravity, Urine: 1.015 (ref 1.005–1.030)
Urobilinogen, UA: 0.2 mg/dL (ref 0.0–1.0)
pH: 6.5 (ref 5.0–8.0)

## 2012-06-04 LAB — POCT I-STAT, CHEM 8
BUN: 35 mg/dL — ABNORMAL HIGH (ref 6–23)
Calcium, Ion: 1.32 mmol/L — ABNORMAL HIGH (ref 1.13–1.30)
Calcium, Ion: 1.26 mmol/L (ref 1.13–1.30)
Chloride: 113 mEq/L — ABNORMAL HIGH (ref 96–112)
Creatinine, Ser: 1.7 mg/dL — ABNORMAL HIGH (ref 0.50–1.10)
Glucose, Bld: 256 mg/dL — ABNORMAL HIGH (ref 70–99)
HCT: 26 % — ABNORMAL LOW (ref 36.0–46.0)
HCT: 35 % — ABNORMAL LOW (ref 36.0–46.0)
Hemoglobin: 8.8 g/dL — ABNORMAL LOW (ref 12.0–15.0)
Potassium: 5.6 mEq/L — ABNORMAL HIGH (ref 3.5–5.1)
Sodium: 132 meq/L — ABNORMAL LOW (ref 135–145)
Sodium: 138 mEq/L (ref 135–145)
TCO2: 12 mmol/L (ref 0–100)
TCO2: 13 mmol/L (ref 0–100)

## 2012-06-04 LAB — BLOOD GAS, ARTERIAL
Bicarbonate: 11.5 mEq/L — ABNORMAL LOW (ref 20.0–24.0)
PEEP: 5 cmH2O
Patient temperature: 97.9
TCO2: 12.8 mmol/L (ref 0–100)
pCO2 arterial: 40 mmHg (ref 35.0–45.0)
pH, Arterial: 7.085 — CL (ref 7.350–7.450)
pO2, Arterial: 94.5 mmHg (ref 80.0–100.0)

## 2012-06-04 LAB — POCT I-STAT 3, VENOUS BLOOD GAS (G3P V)
Acid-base deficit: 16 mmol/L — ABNORMAL HIGH (ref 0.0–2.0)
Bicarbonate: 13.3 mEq/L — ABNORMAL LOW (ref 20.0–24.0)
O2 Saturation: 100 %
pCO2, Ven: 42.6 mmHg — ABNORMAL LOW (ref 45.0–50.0)
pO2, Ven: 239 mmHg — ABNORMAL HIGH (ref 30.0–45.0)

## 2012-06-04 LAB — POCT I-STAT 3, ART BLOOD GAS (G3+)
Acid-base deficit: 23 mmol/L — ABNORMAL HIGH (ref 0.0–2.0)
Bicarbonate: 11.1 mEq/L — ABNORMAL LOW (ref 20.0–24.0)
O2 Saturation: 84 %
Patient temperature: 94.4
TCO2: 13 mmol/L (ref 0–100)
TCO2: 14 mmol/L (ref 0–100)
pCO2 arterial: 61.7 mmHg (ref 35.0–45.0)
pCO2 arterial: 42.8 mmHg (ref 35.0–45.0)
pH, Arterial: 6.846 — CL (ref 7.350–7.450)
pH, Arterial: 7.093 — CL (ref 7.350–7.450)
pO2, Arterial: 77 mmHg — ABNORMAL LOW (ref 80.0–100.0)
pO2, Arterial: 277 mmHg — ABNORMAL HIGH (ref 80.0–100.0)

## 2012-06-04 LAB — OCCULT BLOOD X 1 CARD TO LAB, STOOL: Fecal Occult Bld: POSITIVE

## 2012-06-04 LAB — CBC
HCT: 34 % — ABNORMAL LOW (ref 36.0–46.0)
MCH: 32.5 pg (ref 26.0–34.0)
MCHC: 33.5 g/dL (ref 30.0–36.0)
MCV: 96.9 fL (ref 78.0–100.0)
Platelets: 61 10*3/uL — ABNORMAL LOW (ref 150–400)
RDW: 13.7 % (ref 11.5–15.5)

## 2012-06-04 LAB — POCT I-STAT TROPONIN I: Troponin i, poc: 0.11 ng/mL (ref 0.00–0.08)

## 2012-06-04 LAB — TROPONIN I
Troponin I: <0.3 ng/mL (ref <0.30)
Troponin I: 1.82 ng/mL (ref <0.30)
Troponin I: 2.05 ng/mL (ref <0.30)

## 2012-06-04 LAB — URINE MICROSCOPIC-ADD ON

## 2012-06-04 LAB — MRSA PCR SCREENING: MRSA by PCR: NEGATIVE

## 2012-06-04 LAB — ABO/RH: ABO/RH(D): A NEG

## 2012-06-04 LAB — PRO B NATRIURETIC PEPTIDE: Pro B Natriuretic peptide (BNP): 123.7 pg/mL (ref 0–450)

## 2012-06-04 MED ORDER — BIOTENE DRY MOUTH MT LIQD
15.0000 mL | Freq: Four times a day (QID) | OROMUCOSAL | Status: DC
  Administered 2012-06-05 – 2012-06-08 (×13): 15 mL via OROMUCOSAL

## 2012-06-04 MED ORDER — ATROPINE SULFATE 0.1 MG/ML IJ SOLN
1.0000 mg | Freq: Once | INTRAMUSCULAR | Status: AC
  Administered 2012-06-04: 1 mg via INTRAVENOUS

## 2012-06-04 MED ORDER — SUCCINYLCHOLINE CHLORIDE 20 MG/ML IJ SOLN
INTRAMUSCULAR | Status: AC
  Administered 2012-06-04: 20 mg
  Filled 2012-06-04: qty 1

## 2012-06-04 MED ORDER — SODIUM CHLORIDE 0.9 % IV SOLN
10.0000 ug/h | INTRAVENOUS | Status: AC
  Administered 2012-06-04: 50 ug/h via INTRAVENOUS
  Filled 2012-06-04: qty 50

## 2012-06-04 MED ORDER — DOPAMINE-DEXTROSE 3.2-5 MG/ML-% IV SOLN
2.0000 ug/kg/min | INTRAVENOUS | Status: DC
  Administered 2012-06-04: 10 ug/kg/min via INTRAVENOUS
  Administered 2012-06-05: 9 ug/kg/min via INTRAVENOUS
  Filled 2012-06-04 (×2): qty 250

## 2012-06-04 MED ORDER — VASOPRESSIN 20 UNIT/ML IJ SOLN
0.0300 [IU]/min | INTRAVENOUS | Status: DC
  Administered 2012-06-04: 0.03 [IU]/min via INTRAVENOUS
  Filled 2012-06-04: qty 2.5

## 2012-06-04 MED ORDER — SODIUM CHLORIDE 0.9 % IV SOLN
1.0000 mg/h | INTRAVENOUS | Status: AC
  Administered 2012-06-04: 2 mg/h via INTRAVENOUS
  Filled 2012-06-04: qty 10

## 2012-06-04 MED ORDER — HEPARIN BOLUS VIA INFUSION
2000.0000 [IU] | Freq: Once | INTRAVENOUS | Status: DC
Start: 2012-06-04 — End: 2012-06-04
  Filled 2012-06-04: qty 2000

## 2012-06-04 MED ORDER — CALCIUM CHLORIDE 10 % IV SOLN
1.0000 g | Freq: Once | INTRAVENOUS | Status: AC
  Administered 2012-06-04: 1 g via INTRAVENOUS

## 2012-06-04 MED ORDER — FENTANYL BOLUS VIA INFUSION
25.0000 ug | Freq: Four times a day (QID) | INTRAVENOUS | Status: DC | PRN
  Filled 2012-06-04: qty 100

## 2012-06-04 MED ORDER — SODIUM CHLORIDE 0.9 % IV SOLN
0.5000 ug/kg/min | INTRAVENOUS | Status: DC
  Filled 2012-06-04: qty 20

## 2012-06-04 MED ORDER — MIDAZOLAM BOLUS VIA INFUSION
1.0000 mg | INTRAVENOUS | Status: DC | PRN
  Filled 2012-06-04: qty 2

## 2012-06-04 MED ORDER — SODIUM CHLORIDE 0.9 % IV SOLN
INTRAVENOUS | Status: DC
  Administered 2012-06-04: 15:00:00 via INTRAVENOUS

## 2012-06-04 MED ORDER — LIDOCAINE HCL (CARDIAC) 20 MG/ML IV SOLN
INTRAVENOUS | Status: AC
  Filled 2012-06-04: qty 5

## 2012-06-04 MED ORDER — PANTOPRAZOLE SODIUM 40 MG IV SOLR
40.0000 mg | Freq: Every day | INTRAVENOUS | Status: DC
  Administered 2012-06-04 – 2012-06-08 (×5): 40 mg via INTRAVENOUS
  Filled 2012-06-04 (×6): qty 40

## 2012-06-04 MED ORDER — ROCURONIUM BROMIDE 50 MG/5ML IV SOLN
INTRAVENOUS | Status: AC
  Administered 2012-06-04: 10 mg
  Filled 2012-06-04: qty 2

## 2012-06-04 MED ORDER — SODIUM CHLORIDE 0.9 % IV SOLN
25.0000 ug/h | INTRAVENOUS | Status: DC
  Administered 2012-06-04: 50 ug/h via INTRAVENOUS
  Filled 2012-06-04: qty 50

## 2012-06-04 MED ORDER — EPINEPHRINE HCL 1 MG/ML IJ SOLN
0.5000 ug/min | INTRAVENOUS | Status: DC
  Filled 2012-06-04: qty 1

## 2012-06-04 MED ORDER — HYDROCORTISONE SOD SUCCINATE 100 MG IJ SOLR
100.0000 mg | Freq: Four times a day (QID) | INTRAMUSCULAR | Status: DC
  Administered 2012-06-04 – 2012-06-06 (×7): 100 mg via INTRAVENOUS
  Filled 2012-06-04 (×12): qty 2

## 2012-06-04 MED ORDER — HEPARIN (PORCINE) IN NACL 100-0.45 UNIT/ML-% IJ SOLN
750.0000 [IU]/h | INTRAMUSCULAR | Status: DC
  Filled 2012-06-04: qty 250

## 2012-06-04 MED ORDER — SODIUM BICARBONATE 8.4 % IV SOLN
INTRAVENOUS | Status: DC
  Administered 2012-06-05 (×2): via INTRAVENOUS
  Filled 2012-06-04 (×5): qty 150

## 2012-06-04 MED ORDER — ASPIRIN 300 MG RE SUPP
300.0000 mg | Freq: Every day | RECTAL | Status: DC
  Filled 2012-06-04: qty 1

## 2012-06-04 MED ORDER — ETOMIDATE 2 MG/ML IV SOLN
INTRAVENOUS | Status: AC
  Administered 2012-06-04: 40 mg
  Filled 2012-06-04: qty 20

## 2012-06-04 MED ORDER — SODIUM CHLORIDE 0.9 % IV SOLN
1.0000 mg/h | INTRAVENOUS | Status: DC
  Administered 2012-06-05: 1 mg/h via INTRAVENOUS
  Filled 2012-06-04: qty 10

## 2012-06-04 MED ORDER — IPRATROPIUM BROMIDE 0.02 % IN SOLN
0.5000 mg | Freq: Four times a day (QID) | RESPIRATORY_TRACT | Status: DC
  Administered 2012-06-04 – 2012-06-07 (×11): 0.5 mg via RESPIRATORY_TRACT
  Filled 2012-06-04 (×12): qty 2.5

## 2012-06-04 MED ORDER — EPINEPHRINE HCL 0.1 MG/ML IJ SOLN
1.0000 mg | Freq: Once | INTRAMUSCULAR | Status: AC
  Administered 2012-06-04: 1 mg via INTRAVENOUS

## 2012-06-04 MED ORDER — CHLORHEXIDINE GLUCONATE 0.12 % MT SOLN
15.0000 mL | Freq: Two times a day (BID) | OROMUCOSAL | Status: DC
  Administered 2012-06-04 – 2012-06-07 (×6): 15 mL via OROMUCOSAL
  Filled 2012-06-04 (×6): qty 15

## 2012-06-04 MED ORDER — PIPERACILLIN-TAZOBACTAM 3.375 G IVPB
3.3750 g | Freq: Three times a day (TID) | INTRAVENOUS | Status: DC
  Administered 2012-06-04 – 2012-06-09 (×14): 3.375 g via INTRAVENOUS
  Filled 2012-06-04 (×17): qty 50

## 2012-06-04 MED ORDER — SODIUM CHLORIDE 0.9 % IV SOLN: 250.0000 mL | INTRAVENOUS | Status: DC | PRN

## 2012-06-04 MED ORDER — ALBUTEROL SULFATE (5 MG/ML) 0.5% IN NEBU
2.5000 mg | INHALATION_SOLUTION | Freq: Four times a day (QID) | RESPIRATORY_TRACT | Status: DC
  Administered 2012-06-04 – 2012-06-07 (×11): 2.5 mg via RESPIRATORY_TRACT
  Filled 2012-06-04 (×12): qty 0.5

## 2012-06-04 MED ORDER — CALCIUM CHLORIDE 10 % IV SOLN: 1.0000 g | Freq: Once | INTRAVENOUS | Status: DC

## 2012-06-04 MED FILL — Medication: Qty: 1 | Status: AC

## 2012-06-04 NOTE — ED Notes (Signed)
Pulse palpated. CPR stopped. 

## 2012-06-04 NOTE — Progress Notes (Signed)
eLink Physician-Brief Progress Note Patient Name: [name] DOB: [birth year] MRN: [id number]  Date of Service  06/04/2012   HPI/Events of Note   Hb drop from 15 to 11  eICU Interventions  OK to start heparin empiric for PE per care plan already formulated - hypoxia out of proportion to infx on CXR   Intervention Category Intermediate Interventions: Diagnostic test evaluation  Jemarion Roycroft V. 06/04/2012, 5:04 PM

## 2012-06-04 NOTE — ED Notes (Addendum)
1200 Artic Sun Protocol Activated. 1210 Cold NS started IV per EDP. 1220 Artic Sun Protocol Cancelled and IVF stopped per CCMD

## 2012-06-04 NOTE — Progress Notes (Signed)
PCCM  Progress Note /Code Note  Pt developed PEA arrest and code blue state.  Pt with BRBPR and progressive acidosis. Pt with vasopressor dependence.  Suspect ischemic bowel and GI bleed.  At this point do not feel further heroics warranted.    I will:  Transfuse two units PRBC D/C heparin drip Limit sedation Cancel Echo DNR, no further CPR or DCCV Continue dopamine and vasopressin. Do not feel this pt will survive I met with family and they understand the gravity of the situation.   Luisa Hart WrightMD

## 2012-06-04 NOTE — Procedures (Signed)
Name: [name] MRN: [id number] DOB: 08-10-1931  DOS:  PROCEDURE NOTE  Procedure:  Arterial catheter placement.  Indications:  Need for invasive hemodynamic monitoring / frequent arterial blood gases measurement.  Consent:  Consent was implied due to the emergency nature of the procedure.  Procedure summary:  The patient was identified as [name] and safety timeout was performed. Collateral arterial flow was confirmed by performing Allen's test. Sterile technique was used. The patient's L Femoral site  was prepped using chlorhexidine / alcohol scrub and the field was draped in usual sterile fashion with protective barrier. The left femoral artery was cannulated without difficulty. Blood was aspirated and the catheter was flushed with normal saline without difficulty. Good arterial waveform was obtained. The catheter was secured into place with sterile dressing.  U/S was used to guide placement.   Complications:  No immediate complications were noted.  Estimated blood loss:  Less then 5 mL  Shan Levans Beeper  [phone]  Cell  [phone]  If no response or cell goes to voicemail, call beeper [phone] 06/04/2012, 2:53 PM

## 2012-06-04 NOTE — Progress Notes (Signed)
eLink Physician-Brief Progress Note Patient Name: [name] DOB: [birth year] MRN: [id number]  Date of Service  06/04/2012   HPI/Events of Note     eICU Interventions  SCds for dvt proph   Intervention Category Intermediate Interventions: Best-practice therapies (e.g. DVT, beta blocker, etc.)  ALVA,RAKESH V. 06/04/2012, 8:57 PM

## 2012-06-04 NOTE — Progress Notes (Signed)
Family escorted to bedside. Son lead group in prayer. Family asked to rotate through three to four at a time. Chaplin at bedside. Emotional support given to family. Will continue to monitor and advise attending as needed.

## 2012-06-04 NOTE — ED Notes (Addendum)
EDP intubating pt

## 2012-06-04 NOTE — ED Provider Notes (Addendum)
History     CSN: [id number]  Arrival date & time 06/04/12  1133   First MD Initiated Contact with Patient 06/04/12 1204      Chief Complaint  Patient presents with  . Loss of Consciousness    (Consider location/radiation/quality/duration/timing/severity/associated sxs/prior treatment) HPI Comments: Level 5 caveat due to unresponsiveness.  Pt was at home with family, apparently had a BM and had syncope.  Also vomited. EMS reports pt was breathing and somewhat responsive.  She stopped breathing en route and had to be actively bagged with O2.  Pt had pulses and HR per EMS en route.  They report large BM in toilet, but no blood.  Pt apparently was in the hospital about 1 week ago due to abd pain.  Per family that I spoke to later in ED course reports pt has been taking stool softeners and has been having good BM's, abd pain being thought to be from constipation.  Pt reportedly blind per EMS  Patient is a 78 y.o. female presenting with syncope. The history is provided by the EMS personnel, medical records and a relative.  Loss of Consciousness    Past Medical History  Diagnosis Date  . Arthritis   . Hypertension   . Coronary artery disease   . Stroke   . Seizures     due to hyponatremia, resolved  . History of gout   . Glaucoma   . Cataracts, bilateral   . Acute kidney injury   . CKD (chronic kidney disease), stage III   . Hyperlipidemia   . Gallstones     Past Surgical History  Procedure Date  . Eye surgery   . Abdominal hysterectomy   . Coronary angioplasty with stent placement     Family History  Problem Relation Age of Onset  . Hypertension    . Diabetes type II      History  Substance Use Topics  . Smoking status: Former Games developer  . Smokeless tobacco: Never Used  . Alcohol Use: No    OB History    Grav Para Term Preterm Abortions TAB SAB Ect Mult Living                  Review of Systems  Unable to perform ROS: Unstable vital signs  Cardiovascular:  Positive for syncope.    Allergies  Lactose intolerance (gi)  Home Medications   Current Outpatient Rx  Name Route Sig Dispense Refill  . BRIMONIDINE TARTRATE-TIMOLOL 0.2-0.5 % OP SOLN Left Eye Place 1 drop into the left eye every 12 (twelve) hours.    Marland Kitchen BRINZOLAMIDE 1 % OP SUSP Right Eye Place 1 drop into the right eye 3 (three) times daily.    Marland Kitchen CALCIUM CARBONATE-VITAMIN D 500-125 MG-UNIT PO TABS Oral Take 1 tablet by mouth 2 (two) times daily.    Marland Kitchen CIPROFLOXACIN HCL 500 MG PO TABS Oral Take 1 tablet (500 mg total) by mouth 2 (two) times daily. 8 tablet 0  . DICLOFENAC SODIUM 1 % TD GEL Topical Apply 1 application topically 2 (two) times daily as needed. For pain    . ESCITALOPRAM OXALATE 5 MG PO TABS Oral Take 2.5 mg by mouth daily.    Marland Kitchen EZETIMIBE 10 MG PO TABS Oral Take 10 mg by mouth daily.    Marland Kitchen LEVETIRACETAM 250 MG PO TABS Oral Take 250 mg by mouth every 12 (twelve) hours.    Marland Kitchen PREDNISONE 10 MG PO TABS Oral Take 10 mg by mouth daily.    Marland Kitchen  TRAMADOL HCL 50 MG PO TABS Oral Take 50 mg by mouth daily as needed. For pain      BP 71/29  Pulse 95  Resp 22  Ht 5' 1.02" (1.55 m)  Wt 136 lb 11 oz (62 kg)  BMI 25.81 kg/m2  SpO2 100%  Physical Exam  Nursing note and vitals reviewed. Constitutional: She appears well-developed and well-nourished. She has a sickly appearance.  HENT:  Head: Normocephalic and atraumatic.  Eyes:       Pupils cloudy  Cardiovascular: Normal rate.   No murmur heard.      Initially faint heart sounds, normal rate, became profoundly bradycardic soon after arrival  Pulmonary/Chest: She is in respiratory distress. She has rhonchi.  Abdominal: Soft.  Neurological: She is unresponsive. GCS eye subscore is 1. GCS verbal subscore is 1. GCS motor subscore is 4.  Skin: Skin is warm. No rash noted. No pallor.    ED Course  Angiocath insertion Performed by: Lear Ng Authorized by: Lear Ng Consent: The procedure was performed in an emergent  situation. Patient identity confirmed: anonymous protocol, patient vented/unresponsive Time out: Immediately prior to procedure a "time out" was called to verify the correct patient, procedure, equipment, support staff and site/side marked as required. Preparation: Patient was prepped and draped in the usual sterile fashion. Local anesthesia used: no Patient sedated: no Patient tolerance: Patient tolerated the procedure well with no immediate complications. Comments: 18 gauge placed into left EJ on first attempt with draw back of blood, easily flushed  CENTRAL LINE Performed by: Lear Ng. Authorized by: Lear Ng Consent: The procedure was performed in an emergent situation. Required items: required blood products, implants, devices, and special equipment available Patient identity confirmed: anonymous protocol, patient vented/unresponsive Time out: Immediately prior to procedure a "time out" was called to verify the correct patient, procedure, equipment, support staff and site/side marked as required. Indications: vascular access Patient sedated: yes Sedatives: propofol Vitals: Vital signs were monitored during sedation. Preparation: skin prepped with Betadine Skin prep agent dried: skin prep agent completely dried prior to procedure Hand hygiene: hand hygiene performed prior to central venous catheter insertion Location details: right femoral Patient position: flat Catheter type: triple lumen Number of attempts: 2 Successful placement: yes Post-procedure: line sutured and dressing applied Assessment: blood return through all parts and free fluid flow Patient tolerance: Patient tolerated the procedure well with no immediate complications.  INTUBATION Performed by: Lear Ng Authorized by: Lear Ng Consent: The procedure was performed in an emergent situation. Patient identity confirmed: anonymous protocol, patient vented/unresponsive Time out:  Immediately prior to procedure a "time out" was called to verify the correct patient, procedure, equipment, support staff and site/side marked as required. Indications: airway protection and respiratory failure Intubation method: video-assisted Patient status: paralyzed (RSI) Preoxygenation: BVM and nonrebreather mask Sedatives: etomidate Paralytic: succinylcholine Laryngoscope size: Mac 3 Tube size: 7.5 mm Tube type: cuffed Number of attempts: 1 Cricoid pressure: yes Cords visualized: yes Post-procedure assessment: chest rise and ETCO2 monitor Breath sounds: reduced on left Cuff inflated: yes ETT to teeth: 22 cm Tube secured with: ETT holder Chest x-ray interpreted by me. Chest x-ray findings: endotracheal tube too low Tube repositioned: tube repositioned successfully Comments: Pt had transient desaturation to 87% briefly with subsequent rise to 99% quickly   (including critical care time)  CRITICAL CARE Performed by: Lear Ng   Total critical care time: 60 min  Critical care time was exclusive of separately billable procedures  and treating other patients.  Critical care was necessary to treat or prevent imminent or life-threatening deterioration.  Critical care was time spent personally by me on the following activities: development of treatment plan with patient and/or surrogate as well as nursing, discussions with consultants, evaluation of patient's response to treatment, examination of patient, obtaining history from patient or surrogate, ordering and performing treatments and interventions, ordering and review of laboratory studies, ordering and review of radiographic studies, pulse oximetry and re-evaluation of patient's condition.   Labs Reviewed  CBC WITH DIFFERENTIAL - Abnormal; Notable for the following:    WBC 10.8 (*)     Hemoglobin 15.4 (*)     Platelets 90 (*)  PLATELET COUNT CONFIRMED BY SMEAR   Neutrophils Relative 37 (*)     Lymphocytes Relative  56 (*)     Lymphs Abs 6.1 (*)     All other components within normal limits  POCT I-STAT 3, BLOOD GAS (G3+) - Abnormal; Notable for the following:    pH, Arterial 6.846 (*)     pCO2 arterial 61.7 (*)     pO2, Arterial 77.0 (*)     Bicarbonate 11.1 (*)     Acid-base deficit 23.0 (*)     All other components within normal limits  POCT I-STAT, CHEM 8 - Abnormal; Notable for the following:    Sodium 132 (*)     Potassium 5.6 (*)     Chloride 113 (*)     BUN 35 (*)     Creatinine, Ser 1.70 (*)     Glucose, Bld 256 (*)     Calcium, Ion 1.32 (*)     Hemoglobin 8.8 (*)     HCT 26.0 (*)     All other components within normal limits  POCT I-STAT TROPONIN I - Abnormal; Notable for the following:    Troponin i, poc 0.11 (*)     All other components within normal limits  TROPONIN I  TYPE AND SCREEN  PRO B NATRIURETIC PEPTIDE  URINALYSIS, ROUTINE W REFLEX MICROSCOPIC  ABO/RH   Dg Chest Port 1 View  06/04/2012  *RADIOLOGY REPORT*  Clinical Data: Respiratory failure.  Status post intubation.  PORTABLE CHEST - 1 VIEW  Comparison: Chest 05/26/2012.  Findings: Endotracheal tube is in place with the tip in the right mainstem bronchus.  Withdrawal of 2-3 cm is recommended. There is new bilateral upper lobe airspace disease. Lungs are otherwise clear.  No pneumothorax.  Heart size normal.  IMPRESSION:  1.  Right mainstem bronchus intubation.  Recommend withdrawal of2-3 cm. Finding called to Dr. Oletta Lamas at the time of interpretation. 2.  Right worse than left upper lobe airspace disease could be due to atelectasis or pneumonia.   Original Report Authenticated By: Bernadene Bell. D'ALESSIO, M.D.      1. Cardiac arrest   2. Hypotension   3. Altered mental status   4. Hyperglycemia   5. Renal insufficiency   6. Respiratory failure     ECG at time 12:03 shows atrial fib at rate 108, RVR, non specific IVC delay, freq ectopy, no ST or T wave abn's.     I did review PCXR immediately after it was taken, no  infiltrates, tip of ETT at carina, asked RT to pull back 1 cm.  After discussion with radiologist, asked to pull back another 1.5 cm.      Discussed with Dr. Delford Field who came to see pt, will admit to ICU.  MDM  Unable to attain  initial O2 sat.  Pt quickly became bradcyardic, began actively ventilating.  Pt's pulses became weak, CPR begun, epi given with tachycardia and BP was then palpable.  No IV's, place EJ line, then intubation emergently for depressed LOC and resp protection, poor ventilation.  Pt did have periods of hypotension and had brief widened QRS so calcium amp given as well, brief v tach, but resolved with CPR prior to defibrillation.  Pt responsive to CPR and to epi given a few times.  Dopamine begun for persistent hypotension. Central line placed in femoral vein emergently as well.          Gavin Pound. Oletta Lamas, MD 06/04/12 1343  Gavin Pound. Oletta Lamas, MD 06/04/12 1344  Gavin Pound. Dawud Mays, MD 06/04/12 1503

## 2012-06-04 NOTE — Progress Notes (Signed)
Chaplain responded to Ed nurse page request by reporting to ED Trauma B. Patient was unresponsive during Chaplain's visit. Chaplain got family settled in Consultation Room C and served as a Print production planner between ED staff and family members. Chaplain shared words of comfort with family members and provided compassionate ministry of presence for them. Family members expressed their appreciation for Chaplain's care and the spiritual support they received. Chaplain will continue to provide spiritual support to family members as needed.

## 2012-06-04 NOTE — Consult Note (Signed)
CARDIOLOGY CONSULT NOTE  Patient ID: [name], MRN: [id number], DOB/AGE: 1931-01-30 78 y.o. Admit date: 06/04/2012   Date of Consult: 06/04/2012 Primary Physician: [name], MD Primary Cardiologist: Unclear - ER reports ?[facility]  Chief Complaint: vomiting, altered mental status Reason for Consult: cardiac arrest  HPI: [name] is a 78 y/o F with hx HTN, HL, temporal arteritis on chronic prednisone, cerebrovascular disease who was last evaluated by cardiology by records in 2008 for chest pain at which time nuclear stress testing was negative. She reported a possible vague hx of CAD with cath in 1970's but details were unclear. She was recently admitted 10/11-10/14/13 for encephalopathy felt due to pseudomonas UTI - admit labs at that time showed Na 126, K 7.5, Cr 1.62, WBC 13.6 which had somewhat improved by discharge. Per her family, she was still confused a few days after discharge but started to come back to her normal self by yesterday. She was however still making bizarre comments about people trying to hurt her or people sticking needles in her back. She has been taking laxatives for abdominal pain the last few days.  She was in her usual state of health this morning, awake, alert, and her daughter helped her to the commode. She went to check on her after 2 minutes but her mom was still straining to go. Her daughter checked on her 2 minutes later and found the patient slumped over, belly breathing. Suddenly she stopped breathing. The daughter screamed her name and shook her and the patient began breathing again. Then she had 3 successive whole-body seizure-like activities, each interrupted by a period of limpness. Then she lost control of her bowels and bladder, and began vomiting up through her nose and mouth. EMS arrived and reportedly she had bradycardia then PEA->asystole requiring CPR and epinephrine injection. Pulse was returned but she remained vasopressor dependent. Initial  arrival BP 63/10, 91% RA, P74. She received atropine in the ED. She was intubated and admitted to [facility]. Labs significant for pH 6.846, pCO2 61.7, PO2 77. Na 132, K 5.6, BUN/Cr 35/1.70. pBNP WNL. Initial CBC: 10.8, Hgb 15.4, Plt 90 but Istat H/H showed 8.8/26. [place] showed 100 protein, small Hgb, many sq epis. CT head nonacute. CXR: R>L upper airspace dz possibly due to atx/pneumonia. Received CaCl in the ER. She was started on dopamine, vasopressin, sodium bicarb. Initially she was going to be cooled but that decision has been reversed.  First troponin negative, 2nd POC mildly elevated at 0.11. Next troponin 1.82. Initial EKG showed ?afib 108bpm - no apparent p wave activity on EKG, nonspecific changes, fluctuating axis. F/u EKG now: NSR, no acute ischemic changes. She was placed on heparin per pharmacy.  Past Medical History  Diagnosis Date  . Arthritis   . Hypertension   . Coronary artery disease     a. ?questionable history - pt thought she had heart cath in 1970's but details were unclear. b. Neg stress cardiolite 2008.  . Stroke     a. Details unclear.  . Seizures     due to hyponatremia, resolved  . History of gout   . Glaucoma   . Cataracts, bilateral   . Acute kidney injury   . CKD (chronic kidney disease), stage III   . Hyperlipidemia   . Gallstones   . Cerebrovascular disease   . Hyponatremia     a. H/o 2011 ?SIADH.      Most Recent Cardiac Studies: TEE 10/2006 SUMMARY - Overall left ventricular systolic  function was normal. Left ventricular ejection fraction was estimated , range being 60 % to 65 %. There was no diagnostic evidence of left ventricular regional wall motion abnormalities. Left ventricular wall thickness was mildly increased. - Aortic valve thickness was mildly increased. - There was a moderately extensive, mild atheroma of the descending aorta with no ulceration and no associated thrombus. Some more complex appearing areas although no color flow and no clear  dissection flap seen in transverse or longitudinal views to suggest clear dissection. - There was no left atrial appendage thrombus identified. The interatrial septum bows from right to left, consistent with increased right atrial pressure. - Discussed results at time of study with Dr. Excell Seltzer. Given inconsistent findings between chest CT and TEE, could consider MRA to clarify further if clinical concern remains.   Surgical History:  Past Surgical History  Procedure Date  . Eye surgery   . Abdominal hysterectomy   . Coronary angioplasty with stent placement      Home Meds: Prior to Admission medications   Medication Sig Start Date End Date Taking? Authorizing Provider  brimonidine-timolol (COMBIGAN) 0.2-0.5 % ophthalmic solution Place 1 drop into the left eye every 12 (twelve) hours.    Historical Provider, MD  brinzolamide (AZOPT) 1 % ophthalmic suspension Place 1 drop into the right eye 3 (three) times daily.    Historical Provider, MD  Calcium Carbonate-Vitamin D (CALCIUM 500/D) 500-125 MG-UNIT TABS Take 1 tablet by mouth 2 (two) times daily.    Historical Provider, MD  ciprofloxacin (CIPRO) 500 MG tablet Take 1 tablet (500 mg total) by mouth 2 (two) times daily. 05/29/12   Marinda Elk, MD  diclofenac sodium (VOLTAREN) 1 % GEL Apply 1 application topically 2 (two) times daily as needed. For pain    Historical Provider, MD  escitalopram (LEXAPRO) 5 MG tablet Take 2.5 mg by mouth daily.    Historical Provider, MD  ezetimibe (ZETIA) 10 MG tablet Take 10 mg by mouth daily.    Historical Provider, MD  levETIRAcetam (KEPPRA) 250 MG tablet Take 250 mg by mouth every 12 (twelve) hours.    Historical Provider, MD  predniSONE (DELTASONE) 10 MG tablet Take 10 mg by mouth daily.    Historical Provider, MD  traMADol (ULTRAM) 50 MG tablet Take 50 mg by mouth daily as needed. For pain    Historical Provider, MD    Inpatient Medications:    . atropine  1 mg Intravenous Once  . calcium  chloride  1 g Intravenous Once  . EPINEPHrine  1 mg Intravenous Once  . etomidate      . fentaNYL infusion INTRAVENOUS  10 mcg/hr Intravenous STAT  . hydrocortisone sodium succinate  100 mg Intravenous Q6H  . lidocaine (cardiac) 100 mg/1ml      . midazolam (VERSED) infusion  1 mg/hr Intravenous STAT  . rocuronium      . succinylcholine      . DISCONTD: calcium chloride  1 g Intravenous Once  . DISCONTD: cisatracurium (NIMBEX) infusion  0.5-10 mcg/kg/min Intravenous STAT  . DISCONTD: epinephrine  0.5-20 mcg/min Intravenous STAT    Allergies:  Allergies  Allergen Reactions  . Lactose Intolerance (Gi) Diarrhea and Nausea Only    History   Social History  . Marital Status: Widowed    Spouse Name: N/A    Number of Children: N/A  . Years of Education: N/A   Occupational History  . Not on file.   Social History Main Topics  . Smoking status:  Former Smoker  . Smokeless tobacco: Never Used  . Alcohol Use: No  . Drug Use: No  . Sexually Active:    Other Topics Concern  . Not on file   Social History Narrative  . No narrative on file     Family History  Problem Relation Age of Onset  . Hypertension    . Diabetes type II     unable to verify FHx with pt   Review of Systems:unable to obtain directly from patient due to vent and sedation  Labs:  [facility] 06/04/12 1130  CKTOTAL --  CKMB --  TROPONINI <0.30   Lab Results  Component Value Date   WBC 10.8* 06/04/2012   HGB 8.8* 06/04/2012   HCT 26.0* 06/04/2012   MCV 99.3 06/04/2012   PLT 90* 06/04/2012    Lab 06/04/12 1307 05/29/12 0626  NA 132* --  K 5.6* --  CL 113* --  CO2 -- 18*  BUN 35* --  CREATININE 1.70* --  CALCIUM -- 9.3  PROT -- --  BILITOT -- --  ALKPHOS -- --  ALT -- --  AST -- --  GLUCOSE 256* --   Radiology/Studies:  Dg Chest 2 View 05/26/2012  *RADIOLOGY REPORT*  Clinical Data: Chest pain and shortness of breath.  Altered mental status.  CHEST - 2 VIEW  Comparison: Chest x-ray  11/01/2011.  Findings: Lung volumes are normal.  No consolidative airspace disease.  No pleural effusions.  No pneumothorax.  No pulmonary nodule or mass noted.  Pulmonary vasculature and the cardiomediastinal silhouette are within normal limits.  IMPRESSION: 1. No radiographic evidence of acute cardiopulmonary disease.   Original Report Authenticated By: Florencia Reasons, M.D.    Dg Abd 1 View 05/27/2012  *RADIOLOGY REPORT*  Clinical Data: Abdominal pain.  ABDOMEN - 1 VIEW  Comparison: 05/24/2012  Findings: Multiple gas filled, nondilated small bowel loops are seen mainly in the left abdomen.  There is also gas and moderate stool burden is seen throughout the entire colon.  No evidence of bowel obstruction. Multiple pelvic phleboliths are identified, but no definite radiopaque calculi visualized.  IMPRESSION: Nonspecific, nonobstructive bowel gas pattern.  Moderate stool burden noted.   Original Report Authenticated By: Danae Orleans, M.D.    Dg Abd 1 View 05/24/2012  *RADIOLOGY REPORT*  Clinical Data: Abdominal pain  ABDOMEN - 1 VIEW  Comparison: None.  Findings: Normal bowel gas pattern.  No obstruction.  There are multiple phleboliths in the central lower abdomen and pelvis.  The abdominal and pelvic soft tissues are otherwise unremarkable.  The bones are demineralized.  There are degenerative changes of the lower lumbar spine.  The lung bases are clear.  IMPRESSION: No acute findings.  No evidence of obstruction.   Original Report Authenticated By: Domenic Moras, M.D.    Ct Head Wo Contrast 06/04/2012  *RADIOLOGY REPORT*  Clinical Data: Unresponsive  CT HEAD WITHOUT CONTRAST  Technique:  Contiguous axial images were obtained from the base of the skull through the vertex without contrast.  Comparison: 05/27/2012  Findings: The brain has a normal appearance without evidence for hemorrhage, infarction, hydrocephalus, or mass lesion.  There is no extra axial fluid collection.  The skull and paranasal  sinuses are normal.  IMPRESSION: No acute intracranial abnormalities.   Original Report Authenticated By: Rosealee Albee, M.D.    Dg Chest Port 1 View 06/04/2012  *RADIOLOGY REPORT*  Clinical Data: Respiratory failure.  Status post intubation.  PORTABLE CHEST - 1 VIEW  Comparison: Chest 05/26/2012.  Findings: Endotracheal tube is in place with the tip in the right mainstem bronchus.  Withdrawal of 2-3 cm is recommended. There is new bilateral upper lobe airspace disease. Lungs are otherwise clear.  No pneumothorax.  Heart size normal.  IMPRESSION:  1.  Right mainstem bronchus intubation.  Recommend withdrawal of2-3 cm. Finding called to Dr. Oletta Lamas at the time of interpretation. 2.  Right worse than left upper lobe airspace disease could be due to atelectasis or pneumonia.   Original Report Authenticated By: Bernadene Bell. D'ALESSIO, M.D.    EKG: Admit: irregular HR without apparent P waves ?afib 108bpm LAFB, nonspecific ST-T changes, does appear different than prior tracing F/u: NSR 100bpm nonspecific ST-T wave changes  Physical Exam: Blood pressure 109/77, pulse 102, temperature 92.5 F (33.6 C), resp. rate 26, height 5' 1.02" (1.55 m), weight 136 lb 11 oz (62 kg), SpO2 93.00%. General: Well developed thin elderly AAM in no acute distress. Bear hugger warmer in place. Head: Normocephalic, atraumatic, sclera non-icteric, no xanthomas, nares are without discharge.  Neck: JVD not elevated. Lungs: On vent. Clear bilaterally to auscultation without wheezes, rales, or rhonchi. Breathing is unlabored. Heart: Reg rhythm, mildly tachycardic with S1 S2. No murmurs, rubs, or gallops appreciated. Abdomen: Soft, mildly distended with hypoactive bowel sounds. No hepatomegaly. No rebound/guarding. No obvious abdominal masses. Msk:  Strength and tone appear normal for age. Extremities: No clubbing or cyanosis. No edema.  Distal pedal pulses are 2+ and equal bilaterally. Neuro: Sedated, nonresponsive. No facial  asymmetry. Psych:  Does not respond to questions.   Assessment and Plan:   1. VDRF felt secondary to arrest, ?cardiac versus neurologic 2. ?Seizurelike activity with b/b incontinence 3. Cardiac arrest (bradycardia->PEA-asystole) 3. ?Afib on initial EKG, now NSR 4. NSTEMI with possible hx of CAD 6. Abdominal pain with nausea/vomiting 7. Renal insufficiency/hyperkalemia 8. Anemia - Hgb 15.4-11.4 - just had bloody BM 9. Chronic prednisone use  Discussed patient with Dr. Johney Frame. At this time, the etiology of her arrest is unclear.  Will continue to cycle troponins. EKG without acute changes at present. Lactate pending - with abd pain, ?gut ischemia with marked acidosis. Dr. Johney Frame has discussed case with Dr. Delford Field - see below for comprehensive thoughts. Also spoke with the patient's family to update them.  ADDENDUM: patient just had bloody BM. Will d/c heparin and hold off ASA. PCCM will be made aware.  Signed, Dayna Dunn PA-C 06/04/2012, 2:58 PM   I have seen, examined the patient, and reviewed the above assessment and plan.  Changes to above are made where necessary.  Pt with PEA arrest.  Per report, she has had intermittent abdominal pain for several days.  Progressive mental status changes.  She has been resuscitated and is on pressors in the CCU.  She has just had a large bloody BM with pulselessness requiring CPR again.  I think that the most likely diagnosis is ischemic bowel given her mixed acidosis.   Her prognosis is very poor at this point. Dr Delford Field and I have discussed her situation.  We do not feel that an echo would prove beneficial immediately.  She will receive IVF and PRBCs.  Family has been informed of her very poor prognosis by Dr Delford Field.  We will follow with you. No immediate CV intervention is planned.  Co Sign: Hillis Range, MD 06/04/2012 5:22 PM

## 2012-06-04 NOTE — ED Notes (Signed)
CPR started.

## 2012-06-04 NOTE — Progress Notes (Signed)
Chaplain responded to a code blue page announcement and reported to 2909. Patient was unresponsive and room parked with critical care team. Chaplain had earlier in the afternoon provided care to family members of patient in the PM at the ED. Chaplain gathered family into consulting room at the Unit for physician to update them on the progress of patient. Chaplain prayed with family members and shared words of encouragement and comfort with them. Chaplain served as Print production planner between family and medical staff at 2900. Family expressed their gratitude for Chaplain's visit and spiritual presence. Chaplain will continue to provide care to family and patient as needed.

## 2012-06-04 NOTE — H&P (Signed)
Name: [name] MRN: [id number] DOB: 07-03-1931    LOS: 0  Referring Provider: Oletta Lamas Reason for Referral:  VDRF secondary cardiac arrest.   PULMONARY / CRITICAL CARE MEDICINE  HPI:  78 yo AAF who was on the commode today, vomited, and had an acute change in mental status(declined) and EMS found her marginally responsive. She then became asystole and required CPR and epinephrine injection. Pulse was returned but she remained vasopressor dependent. She was intubated and 3 port cvl placed(rt fem) per EDP. PCCM asked to assume her care 06-04-12 12:40am.   Past Medical History  Diagnosis Date  . Arthritis   . Hypertension   . Coronary artery disease   . Stroke   . Seizures     due to hyponatremia, resolved  . History of gout   . Glaucoma   . Cataracts, bilateral   . Acute kidney injury   . CKD (chronic kidney disease), stage III   . Hyperlipidemia   . Gallstones    Past Surgical History  Procedure Date  . Eye surgery   . Abdominal hysterectomy   . Coronary angioplasty with stent placement    Prior to Admission medications   Medication Sig Start Date End Date Taking? Authorizing Provider  brimonidine-timolol (COMBIGAN) 0.2-0.5 % ophthalmic solution Place 1 drop into the left eye every 12 (twelve) hours.    Historical Provider, MD  brinzolamide (AZOPT) 1 % ophthalmic suspension Place 1 drop into the right eye 3 (three) times daily.    Historical Provider, MD  Calcium Carbonate-Vitamin D (CALCIUM 500/D) 500-125 MG-UNIT TABS Take 1 tablet by mouth 2 (two) times daily.    Historical Provider, MD  ciprofloxacin (CIPRO) 500 MG tablet Take 1 tablet (500 mg total) by mouth 2 (two) times daily. 05/29/12   Marinda Elk, MD  diclofenac sodium (VOLTAREN) 1 % GEL Apply 1 application topically 2 (two) times daily as needed. For pain     Historical Provider, MD  escitalopram (LEXAPRO) 5 MG tablet Take 2.5 mg by mouth daily.    Historical Provider, MD  ezetimibe (ZETIA) 10 MG tablet Take 10 mg by mouth daily.    Historical Provider, MD  levETIRAcetam (KEPPRA) 250 MG tablet Take 250 mg by mouth every 12 (twelve) hours.    Historical Provider, MD  predniSONE (DELTASONE) 10 MG tablet Take 10 mg by mouth daily.    Historical Provider, MD  traMADol (ULTRAM) 50 MG tablet Take 50 mg by mouth daily as needed. For pain    Historical Provider, MD   Allergies Allergies  Allergen Reactions  . Lactose Intolerance (Gi) Diarrhea and Nausea Only    Family History Family History  Problem Relation Age of Onset  . Hypertension    . Diabetes type II     Social History  reports that she has quit smoking. She has never used smokeless  tobacco. She reports that she does not drink alcohol or use illicit drugs.  Review Of Systems: NA, not able to obtain.      Events Since Admission: Post arrest  Current Status: Sedated  On vent. Critically ill   Vital Signs: Temp:  [92.5 F (33.6 C)] 92.5 F (33.6 C) (10/20 1400) Pulse Rate:  [95-156] 102  (10/20 1400) Resp:  [15-29] 26  (10/20 1400) BP: (63-138)/(10-77) 109/77 mmHg (10/20 1400) SpO2:  [89 %-100 %] 93 % (10/20 1400) Arterial Line BP: (102)/(68) 102/68 mmHg (10/20 1400) FiO2 (%):  [100 %] 100 % (10/20 1237) Weight:  [62 kg (136 lb 11 oz)] 62 kg (136 lb 11 oz) (10/20 1100)  Physical Examination: General:  Sedated on vent, no distress Neuro:  sedated HEENT:  Left pupil 6 cm and rt 3 and NR. No LAN Neck:  No JVD Cardiovascular:  HSIR IR Lungs:  Coarse rhonchi bilat Abdomen:  Soft , no bs Musculoskeletal:  intact Skin:  cool  Principal Problem:  *Cardiac arrest Active Problems:  Acute renal failure  Monoclonal gammopathy  Temporal arteritis  Respiratory failure, acute  Metabolic acidosis Dg Chest Port 1 View  06/04/2012  *RADIOLOGY REPORT*  Clinical Data:  Respiratory failure.  Status post intubation.  PORTABLE CHEST - 1 VIEW  Comparison: Chest 05/26/2012.  Findings: Endotracheal tube is in place with the tip in the right mainstem bronchus.  Withdrawal of 2-3 cm is recommended. There is new bilateral upper lobe airspace disease. Lungs are otherwise clear.  No pneumothorax.  Heart size normal.  IMPRESSION:  1.  Right mainstem bronchus intubation.  Recommend withdrawal of2-3 cm. Finding called to Dr. Oletta Lamas at the time of interpretation. 2.  Right worse than left upper lobe airspace disease could be due to atelectasis or pneumonia.   Original Report Authenticated By: Bernadene Bell. D'ALESSIO, M.D.      ASSESSMENT AND PLAN  PULMONARY  Lab 06/04/12 1303  PHART 6.846*  PCO2ART 61.7*  PO2ART 77.0*  HCO3 11.1*  O2SAT 84.0   Ventilator Settings: Vent Mode:  [-] PRVC FiO2 (%):  [100 %] 100 % Set Rate:  [14 bmp] 14 bmp Vt Set:  [370 mL-460 mL] 370 mL PEEP:  [5 cmH20] 5 cmH20 Plateau Pressure:  [12 cmH20-21 cmH20] 21 cmH20   ETT:  10-20  A:  VDRF secondary to arrest, presumed cardiac or neuro.  P:   -see vent orders -full vent supp.  Rate to 30   CARDIOVASCULAR  Lab 06/04/12 1244 06/04/12 1130  TROPONINI -- <0.30  LATICACIDVEN -- --  PROBNP 123.7 --   ECG:  Afib, BBB, ant lat ischemia Lines:  10-20 rt fem cvl>> 10-20 lt fem aline>>  A: Presumed cardiac event  ?etiology.  Bradycardia>>PEA>>asystole. Severe hypoxia, Acute pulm edema.  Hx of CAD.    P:  -CE x 3 -12 lead -cards consult -echocardiogram -?PE   ? AMI  ? CVA or ICH  RENAL  Lab 06/04/12 1307 05/29/12 0626  NA 132* 133*  K 5.6* 4.2  CL 113* 99  CO2 -- 18*  BUN 35* 19  CREATININE 1.70* 1.48*  CALCIUM -- 9.3  MG -- --  PHOS -- --   Intake/Output    None    Foley: 10-20  A:  Renal Insuff, hyperkalemia P:   -monitor creatine -monitor K   GASTROINTESTINAL No results found for this basename: AST:5,ALT:5,ALKPHOS:5,BILITOT:5,PROT:5,ALBUMIN:5 in the last 168  hours  A:  Nausea, vomiting P:   -place ogt -antiemetic  HEMATOLOGIC  Lab 06/04/12 1307 06/04/12 1100  HGB 8.8* 15.4*  HCT 26.0* 45.4  PLT -- 90*  INR -- --  APTT -- --   A:  Falling Hgb  ?source P:  Heparin drip for ?AMI and ?PE F/u cbc chk INR/PT  PTT  INFECTIOUS  Lab 06/04/12 1100  WBC 10.8*  PROCALCITON --   Cultures: None  Antibiotics: None  A:  No acute issue P:   No ABX for now   ENDOCRINE No results found for this basename: GLUCAP:5 in the last 168 hours A:  Presumed adrenal suppression from chronic steroids . On steroids d/t temporal arteritis P:   -stress steroids  NEUROLOGIC  A:  AMS post arrest, ? ICH or ? CVA P:   -Ct head   BEST PRACTICE / DISPOSITION Level of Care:  ICU Primary Service:  PCCM Consultants:  Cards Code Status:  Full Diet:  NPO DVT Px:  SCD GI Px:  PPI Skin Integrity:  intact Social / Family:  Family updated   CC 1.0 Hrs  Caryl Bis  [phone]  Cell  [phone]  If no response or cell goes to voicemail, call beeper [phone]

## 2012-06-04 NOTE — Progress Notes (Signed)
MEDICATION RELATED CONSULT NOTE - INITIAL   Pharmacy Consult for heparin and zosyn Indication: r/o PE v [place] v CVA, r/o PNA  Allergies  Allergen Reactions  . Lactose Intolerance (Gi) Diarrhea and Nausea Only    Patient Measurements: Height: 5\' 2"  (157.5 cm) Weight: 136 lb 11 oz (62 kg) IBW/kg (Calculated) : 50.1  Heparin dosing weight: 62 kg  Vital Signs: Temp: 92.5 F (33.6 C) (10/20 1400) BP: 109/77 mmHg (10/20 1400) Pulse Rate: 102  (10/20 1400) Intake/Output from previous day:   Intake/Output from this shift: Total I/O In: -  Out: 100 [Urine:100]  Labs:  Basename 06/04/12 1307 06/04/12 1100  WBC -- 10.8*  HGB 8.8* 15.4*  HCT 26.0* 45.4  PLT -- 90*  APTT -- --  CREATININE 1.70* --  LABCREA -- --  CREATININE 1.70* --  CREAT24HRUR -- --  MG -- --  PHOS -- --  ALBUMIN -- --  PROT -- --  ALBUMIN -- --  AST -- --  ALT -- --  ALKPHOS -- --  BILITOT -- --  BILIDIR -- --  IBILI -- --   Estimated Creatinine Clearance: 23.3 ml/min (by C-G formula based on Cr of 1.7).   Microbiology: Recent Results (from the past 720 hour(s))  URINE CULTURE     Status: Normal   Collection Time   05/26/12  4:25 PM      Component Value Range Status Comment   Specimen Description URINE, RANDOM   Final    Special Requests NONE   Final    Culture  Setup Time 05/26/2012 18:25   Final    Colony Count >=100,000 COLONIES/ML   Final    Culture PSEUDOMONAS AERUGINOSA   Final    Report Status 05/28/2012 FINAL   Final    Organism ID, Bacteria PSEUDOMONAS AERUGINOSA   Final   GRAM STAIN     Status: Normal   Collection Time   05/26/12  4:25 PM      Component Value Range Status Comment   Specimen Description URINE, RANDOM   Final    Special Requests NONE   Final    Gram Stain     Final    Value: CYTOSPIN SLIDE     SQUAMOUS EPITHELIAL CELLS PRESENT     WBC PRESENT,BOTH PMN AND MONONUCLEAR     NEGATIVE FOR BACTERIA   Report Status 05/26/2012 FINAL   Final   CULTURE, BLOOD (ROUTINE  X 2)     Status: Normal   Collection Time   05/26/12  8:45 PM      Component Value Range Status Comment   Specimen Description BLOOD HAND RIGHT   Final    Special Requests BOTTLES DRAWN AEROBIC AND ANAEROBIC 10CC   Final    Culture  Setup Time 05/27/2012 04:50   Final    Culture NO GROWTH 5 DAYS   Final    Report Status 06/02/2012 FINAL   Final   CULTURE, BLOOD (ROUTINE X 2)     Status: Normal   Collection Time   05/26/12  8:55 PM      Component Value Range Status Comment   Specimen Description BLOOD ARM RIGHT   Final    Special Requests BOTTLES DRAWN AEROBIC AND ANAEROBIC 10CC   Final    Culture  Setup Time 05/27/2012 04:50   Final    Culture NO GROWTH 5 DAYS   Final    Report Status 06/02/2012 FINAL   Final     Medical History: Past  Medical History  Diagnosis Date  . Arthritis   . Hypertension   . Coronary artery disease     a. Neg stress test 2008. b. ?questionable history - pt thought she had heart cath in 1970's but details were unclear.  . Stroke     a. Details unclear.  . Seizures     a. Due to hyponatremia, resolved.  Marland Kitchen History of gout   . Glaucoma   . Cataracts, bilateral   . Acute kidney injury   . CKD (chronic kidney disease), stage III   . Hyperlipidemia   . Gallstones   . Cerebrovascular disease   . Hyponatremia     a. H/o 2011 ?SIADH with seizures, encephalopathy at that time.  . Temporal arteritis     Medications:  Scheduled:    . albuterol  2.5 mg Nebulization Q6H   And  . ipratropium  0.5 mg Nebulization Q6H  . atropine  1 mg Intravenous Once  . calcium chloride  1 g Intravenous Once  . EPINEPHrine  1 mg Intravenous Once  . etomidate      . fentaNYL infusion INTRAVENOUS  10 mcg/hr Intravenous STAT  . hydrocortisone sodium succinate  100 mg Intravenous Q6H  . lidocaine (cardiac) 100 mg/33ml      . midazolam (VERSED) infusion  1 mg/hr Intravenous STAT  . pantoprazole (PROTONIX) IV  40 mg Intravenous QHS  . rocuronium      . succinylcholine       . DISCONTD: calcium chloride  1 g Intravenous Once  . DISCONTD: cisatracurium (NIMBEX) infusion  0.5-10 mcg/kg/min Intravenous STAT  . DISCONTD: epinephrine  0.5-20 mcg/min Intravenous STAT   Infusions:    . sodium chloride    . DOPamine 20 mcg/kg/min (06/04/12 1250)  . fentaNYL infusion INTRAVENOUS    . midazolam (VERSED) infusion    .  sodium bicarbonate infusion 1000 mL    . vasopressin (PITRESSIN) infusion - *FOR SHOCK* 0.03 Units/min (06/04/12 1345)    Assessment: 89 F brought to ED via EMS after requiring CPR, epi 2/2 asystole. Currently intubated on pressors.  CT head is negative for acute changes, bleed.  To start heparin for r/o PE v [place] v CVA, zosyn for possible PNA.  Acute on CRF with SCr 1.7 this am, CrCl ~25 ml/min.  Hgb with rapid decrease today 15>8.8 - MD aware - monitor closely on heparin.  Goal of Therapy:  Heparin level 0.3-0.7 Eradication of infection  Plan:  - Zosyn 3.375G IV q8h to be infused over 4 hours - Heparin 2000 units IV x1 - Heparin 750 units/hr - Check 8h heparin level - Check daily heparin level and CBC - Follow up SCr, UOP, cultures, clinical course and adjust as clinically indicated.  [name] L. Illene Bolus, PharmD, BCPS Clinical Pharmacist Pager: [phone] Pharmacy: [phone] 06/04/2012 3:26 PM

## 2012-06-04 NOTE — Code Documentation (Signed)
Responded to Code blue page.  Arrived and CCU Physician Dr. Delford Field was managing the code.  He excused me as he did not need my help.  Patient was actively being resuscitated when I left.

## 2012-06-04 NOTE — ED Notes (Signed)
Family at bedside. 

## 2012-06-05 ENCOUNTER — Ambulatory Visit: Payer: Medicare Other | Admitting: Physician Assistant

## 2012-06-05 ENCOUNTER — Inpatient Hospital Stay (HOSPITAL_COMMUNITY): Payer: Medicare Other

## 2012-06-05 ENCOUNTER — Other Ambulatory Visit: Payer: Medicare Other | Admitting: Lab

## 2012-06-05 DIAGNOSIS — N179 Acute kidney failure, unspecified: Secondary | ICD-10-CM

## 2012-06-05 DIAGNOSIS — R4182 Altered mental status, unspecified: Secondary | ICD-10-CM

## 2012-06-05 DIAGNOSIS — G9340 Encephalopathy, unspecified: Secondary | ICD-10-CM

## 2012-06-05 DIAGNOSIS — I369 Nonrheumatic tricuspid valve disorder, unspecified: Secondary | ICD-10-CM

## 2012-06-05 DIAGNOSIS — J9600 Acute respiratory failure, unspecified whether with hypoxia or hypercapnia: Secondary | ICD-10-CM

## 2012-06-05 LAB — TYPE AND SCREEN
ABO/RH(D): A NEG
Antibody Screen: NEGATIVE
Unit division: 0
Unit division: 0

## 2012-06-05 LAB — BLOOD GAS, ARTERIAL
Acid-base deficit: 2.3 mmol/L — ABNORMAL HIGH (ref 0.0–2.0)
Drawn by: 27733
FIO2: 0.7 %
MECHVT: 400 mL
RATE: 10 resp/min
pCO2 arterial: 31 mmHg — ABNORMAL LOW (ref 35.0–45.0)
pH, Arterial: 7.449 (ref 7.350–7.450)
pO2, Arterial: 290 mmHg — ABNORMAL HIGH (ref 80.0–100.0)

## 2012-06-05 LAB — PATHOLOGIST SMEAR REVIEW

## 2012-06-05 LAB — GLUCOSE, CAPILLARY
Glucose-Capillary: 116 mg/dL — ABNORMAL HIGH (ref 70–99)
Glucose-Capillary: 144 mg/dL — ABNORMAL HIGH (ref 70–99)
Glucose-Capillary: 214 mg/dL — ABNORMAL HIGH (ref 70–99)
Glucose-Capillary: 231 mg/dL — ABNORMAL HIGH (ref 70–99)
Glucose-Capillary: 256 mg/dL — ABNORMAL HIGH (ref 70–99)
Glucose-Capillary: 261 mg/dL — ABNORMAL HIGH (ref 70–99)
Glucose-Capillary: 349 mg/dL — ABNORMAL HIGH (ref 70–99)
Glucose-Capillary: 398 mg/dL — ABNORMAL HIGH (ref 70–99)
Glucose-Capillary: 88 mg/dL (ref 70–99)

## 2012-06-05 LAB — CBC
HCT: 42.7 % (ref 36.0–46.0)
MCV: 88.6 fL (ref 78.0–100.0)
RBC: 4.82 MIL/uL (ref 3.87–5.11)
WBC: 13.1 10*3/uL — ABNORMAL HIGH (ref 4.0–10.5)

## 2012-06-05 LAB — BASIC METABOLIC PANEL
BUN: 36 mg/dL — ABNORMAL HIGH (ref 6–23)
CO2: 18 mEq/L — ABNORMAL LOW (ref 19–32)
Chloride: 104 mEq/L (ref 96–112)
Creatinine, Ser: 1.45 mg/dL — ABNORMAL HIGH (ref 0.50–1.10)

## 2012-06-05 LAB — MAGNESIUM: Magnesium: 1.3 mg/dL — ABNORMAL LOW (ref 1.5–2.5)

## 2012-06-05 MED ORDER — DEXTROSE 50 % IV SOLN
50.0000 mL | Freq: Once | INTRAVENOUS | Status: AC | PRN
  Administered 2012-06-05: 12.5 mL via INTRAVENOUS

## 2012-06-05 MED ORDER — INSULIN ASPART 100 UNIT/ML ~~LOC~~ SOLN
0.0000 [IU] | SUBCUTANEOUS | Status: DC
  Administered 2012-06-05: 2 [IU] via SUBCUTANEOUS
  Administered 2012-06-06 (×2): 1 [IU] via SUBCUTANEOUS
  Administered 2012-06-06: 2 [IU] via SUBCUTANEOUS
  Administered 2012-06-06: 1 [IU] via SUBCUTANEOUS
  Administered 2012-06-06 (×2): 2 [IU] via SUBCUTANEOUS
  Administered 2012-06-07 (×3): 1 [IU] via SUBCUTANEOUS
  Administered 2012-06-07 (×2): 2 [IU] via SUBCUTANEOUS
  Administered 2012-06-08 – 2012-06-09 (×2): 1 [IU] via SUBCUTANEOUS

## 2012-06-05 MED ORDER — DEXTROSE 50 % IV SOLN
INTRAVENOUS | Status: AC
  Filled 2012-06-05: qty 50

## 2012-06-05 MED ORDER — FENTANYL BOLUS VIA INFUSION
50.0000 ug | INTRAVENOUS | Status: DC | PRN
  Filled 2012-06-05: qty 200

## 2012-06-05 MED ORDER — SODIUM CHLORIDE 0.9 % IV SOLN
INTRAVENOUS | Status: DC
  Administered 2012-06-05: 13.9 [IU]/h via INTRAVENOUS
  Administered 2012-06-05: 3.4 [IU]/h via INTRAVENOUS
  Filled 2012-06-05 (×2): qty 1

## 2012-06-05 MED ORDER — DEXTROSE 10 % IV SOLN: INTRAVENOUS | Status: DC | PRN

## 2012-06-05 MED ORDER — DEXTROSE 5 % IV SOLN
INTRAVENOUS | Status: DC
  Administered 2012-06-05: 15:00:00 via INTRAVENOUS

## 2012-06-05 NOTE — Progress Notes (Signed)
eLink Physician-Brief Progress Note Patient Name: [name] DOB: Mar 19, 1931 MRN: [id number]  Date of Service  06/05/2012   HPI/Events of Note   Called re: oliguria. Last CVP 9. BP elevated. Cr improving  eICU Interventions   Cont to monitor      Billy Fischer 06/05/2012, 7:17 PM

## 2012-06-05 NOTE — Progress Notes (Signed)
INITIAL ADULT NUTRITION ASSESSMENT Date: 06/05/2012   Time: 9:51 AM Reason for Assessment: vent   INTERVENTION: 1. Recommend initiation of EN vs TPN  2. RD will continue to follow    DOCUMENTATION CODES Per approved criteria  -Not Applicable    ASSESSMENT: Female 78 y.o.  Dx: Cardiac arrest  Hx:  Past Medical History  Diagnosis Date  . Arthritis   . Hypertension   . Coronary artery disease     a. Neg stress test 2008. b. ?questionable history - pt thought she had heart cath in 1970's but details were unclear.  . Stroke     a. Details unclear.  . Seizures     a. Due to hyponatremia, resolved.  Marland Kitchen History of gout   . Glaucoma   . Cataracts, bilateral   . Acute kidney injury   . CKD (chronic kidney disease), stage III   . Hyperlipidemia   . Gallstones   . Cerebrovascular disease   . Hyponatremia     a. H/o 2011 ?SIADH with seizures, encephalopathy at that time.  . Temporal arteritis     Past Surgical History  Procedure Date  . Eye surgery   . Abdominal hysterectomy   . Coronary angioplasty with stent placement     Related Meds:     . albuterol  2.5 mg Nebulization Q6H   And  . ipratropium  0.5 mg Nebulization Q6H  . antiseptic oral rinse  15 mL Mouth Rinse QID  . atropine  1 mg Intravenous Once  . calcium chloride  1 g Intravenous Once  . chlorhexidine  15 mL Mouth/Throat BID  . EPINEPHrine  1 mg Intravenous Once  . etomidate      . fentaNYL infusion INTRAVENOUS  10 mcg/hr Intravenous STAT  . hydrocortisone sodium succinate  100 mg Intravenous Q6H  . lidocaine (cardiac) 100 mg/67ml      . midazolam (VERSED) infusion  1 mg/hr Intravenous STAT  . pantoprazole (PROTONIX) IV  40 mg Intravenous QHS  . piperacillin-tazobactam (ZOSYN)  IV  3.375 g Intravenous Q8H  . rocuronium      . succinylcholine      . DISCONTD: aspirin  300 mg Rectal Daily  . DISCONTD: calcium chloride  1 g Intravenous Once  . DISCONTD: cisatracurium (NIMBEX) infusion  0.5-10 mcg/kg/min  Intravenous STAT  . DISCONTD: epinephrine  0.5-20 mcg/min Intravenous STAT  . DISCONTD: heparin  2,000 Units Intravenous Once     Ht: 5\' 2"  (157.5 cm)  Wt: 146 lb 9.7 oz (66.5 kg)  Ideal Wt: 50 kg  % Ideal Wt: 133%  Usual Wt:  Wt Readings from Last 5 Encounters:  06/05/12 146 lb 9.7 oz (66.5 kg)  05/26/12 136 lb 11 oz (62 kg)  05/20/12 131 lb (59.421 kg)  02/11/12 143 lb 8 oz (65.091 kg)  01/18/12 148 lb (67.132 kg)    % Usual Wt: 107%  Body mass index is 26.81 kg/(m^2). Pt is overweight per current BMI   Food/Nutrition Related Hx: Pt with hx of abdominal pain, recent dc from hospital, no poor appetite or weight loss per MST (Malnutrition Screening Tool)   Labs:  CMP     Component Value Date/Time   NA 134* 06/05/2012 0311   K 3.8 06/05/2012 0311   CL 104 06/05/2012 0311   CO2 18* 06/05/2012 0311   GLUCOSE 322* 06/05/2012 0311   BUN 36* 06/05/2012 0311   CREATININE 1.45* 06/05/2012 0311   CALCIUM 7.2* 06/05/2012 0311   PROT 8.8*  11/01/2011 1945   ALBUMIN 3.2* 11/01/2011 1945   AST 17 11/01/2011 1945   ALT 12 11/01/2011 1945   ALKPHOS 58 11/01/2011 1945   BILITOT 0.3 11/01/2011 1945   GFRNONAA 33* 06/05/2012 0311   GFRAA 39* 06/05/2012 0311    Intake/Output Summary (Last 24 hours) at 06/05/12 0953 Last data filed at 06/05/12 0900  Gross per 24 hour  Intake 2637.5 ml  Output   1095 ml  Net 1542.5 ml     Diet Order: NPO  Supplements/Tube Feeding:  None   IVF:    sodium chloride Last Rate: 75 mL/hr at 06/04/12 1521  dextrose   DOPamine Last Rate: 8 mcg/kg/min (06/05/12 0700)  fentaNYL infusion INTRAVENOUS Last Rate: Stopped (06/05/12 0850)  insulin (NOVOLIN-R) infusion Last Rate: 13.1 Units/hr (06/05/12 0900)  midazolam (VERSED) infusion Last Rate: Stopped (06/05/12 0850)  sodium bicarbonate infusion 1000 mL Last Rate: 125 mL/hr at 06/05/12 0700  vasopressin (PITRESSIN) infusion - *FOR SHOCK* Last Rate: Stopped (06/04/12 2310)  DISCONTD: heparin      Estimated Nutritional Needs:   Kcal: 1417 Protein: 100-125 gm  Fluid:  1.4 L   Pt admitted after arrest with VDRF, cardiac vs neurologic? Arctic sun initiated. Code Blue on 10/20 at 5:57 pm, pea arrest. Pt with suspected ischemic bowel and GI bleed.  Now more hemodynamically stable per cards notes this morning. Sedation off now.  Recommend initiation of nutrition support while pt remains intubated. Abdominal images report show no obstruction. Recommend initiation of EN vs TPN depending on bowel function. Ischemic bowel is an indicator for TPN.   MV: 13.2  Temp:Temp (24hrs), Avg:94.7 F (34.8 C), Min:90.5 F (32.5 C), Max:98.4 F (36.9 C)  Propofol: none    NUTRITION DIAGNOSIS: -Inadequate oral intake (NI-2.1).  Status: Ongoing  RELATED TO: inability to eat  AS EVIDENCE BY: mechanical ventilation   MONITORING/EVALUATION(Goals): Goal: Meet >90% estimated nutrition needs  Monitor: vent status, weight, labs, initiation of nutrition support  EDUCATION NEEDS: -No education needs identified at this time   Clarene Duke RD, LDN Pager [phone] After Hours pager [phone]  06/05/2012, 9:51 AM

## 2012-06-05 NOTE — Progress Notes (Signed)
Notified Dr. Marchelle Gearing that CBG <250. No new orders for dextrose. Will continue to monitor closely.

## 2012-06-05 NOTE — Progress Notes (Signed)
Inpatient Diabetes Program Recommendations  AACE/ADA: New Consensus Statement on Inpatient Glycemic Control (2013)  Target Ranges:  Prepandial:   less than 140 mg/dL      Peak postprandial:   less than 180 mg/dL (1-2 hours)      Critically ill patients:  140 - 180 mg/dL      Note: Please consider adding Dextrose to IVF if enteral nutrition or TPN is not started today since the patient is on IV insulin.  Will continue to follow. Thanks, Orlando Penner, RN, BSN, CCRN Diabetes Coordinator Inpatient Diabetes Program [phone]

## 2012-06-05 NOTE — Progress Notes (Signed)
Prayed bedside for pt who was sleeping/unresponsive. Family was not present - prayed silent prayer for pt.  Marjory Lies Chaplain

## 2012-06-05 NOTE — Progress Notes (Signed)
Patient Name: [name] Date of Encounter: 06/05/2012     Principal Problem:  *Cardiac arrest Active Problems:  Acute renal failure  Monoclonal gammopathy  Temporal arteritis  Respiratory failure, acute  Metabolic acidosis  Atrial fibrillation    SUBJECTIVE: Intubated, sedated.    OBJECTIVE  Filed Vitals:   06/05/12 0400 06/05/12 0500 06/05/12 0600 06/05/12 0700  BP:      Pulse: 101 103 99 95  Temp: 97.9 F (36.6 C) 98.4 F (36.9 C) 98.1 F (36.7 C) 98.2 F (36.8 C)  TempSrc:      Resp: 35 35 35 1  Height:      Weight:  66.5 kg (146 lb 9.7 oz)    SpO2: 100% 100% 100% 100%    Intake/Output Summary (Last 24 hours) at 06/05/12 0759 Last data filed at 06/05/12 0700  Gross per 24 hour  Intake 2128.85 ml  Output    965 ml  Net 1163.85 ml   Weight change:   PHYSICAL EXAM  General: Elderly, intubated on ventilator. Head: Normocephalic, atraumatic, sclera non-icteric, no xanthomas, nares are without discharge.  Neck: Supple without bruits or JVD. Lungs:  Ventilator-dependant. Trace bibasilar L>R rales, upper resp stridor appreciated. Heart: RRR no s3, s4, or murmurs. Abdomen: Soft, non-tender, non-distended, normoactive BS in all 4 quads.  Msk:  Strength and tone appears normal for age. Extremities: Cool extremities peripherally. No clubbing, cyanosis or edema. DP/PT/Radials 2+ and equal bilaterally. Neuro: Sedated.  Psych: Sedated.  LABS:  Recent Labs  [facility] 06/05/12 0311 06/04/12 1724 06/04/12 1541   WBC 13.1* -- 13.8*   HGB 15.4* 11.9* --   HCT 42.7 35.0* --   MCV 88.6 -- 96.9   PLT 38* -- 61*   Lab 06/05/12 0311 06/04/12 1724 06/04/12 1307  NA 134* 138 132*  K 3.8 5.4* 5.6*  CL 104 114* 113*  CO2 18* -- --  BUN 36* 37* 35*  CREATININE 1.45* 1.70* 1.70*  CALCIUM 7.2* -- --  PROT -- -- --  BILITOT -- -- --  ALKPHOS -- -- --  ALT -- -- --  AST -- -- --  AMYLASE -- -- --  LIPASE -- -- --  GLUCOSE 322* 282* 256*   Recent Labs    Basename 06/05/12 0311 06/04/12 2020 06/04/12 1511   CKTOTAL -- -- --   CKMB -- -- --   CKMBINDEX -- -- --   TROPONINI 1.75* 2.05* 1.82*    TELE: sinus tachycardia, low 100 to 1-teens.    Radiology/Studies:  Dg Chest 2 View  05/26/2012  *RADIOLOGY REPORT*  Clinical Data: Chest pain and shortness of breath.  Altered mental status.  CHEST - 2 VIEW  Comparison: Chest x-ray 11/01/2011.  Findings: Lung volumes are normal.  No consolidative airspace disease.  No pleural effusions.  No pneumothorax.  No pulmonary nodule or mass noted.  Pulmonary vasculature and the cardiomediastinal silhouette are within normal limits.  IMPRESSION: 1. No radiographic evidence of acute cardiopulmonary disease.   Original Report Authenticated By: Florencia Reasons, M.D.    Dg Abd 1 View  05/27/2012  *RADIOLOGY REPORT*  Clinical Data: Abdominal pain.  ABDOMEN - 1 VIEW  Comparison: 05/24/2012  Findings: Multiple gas filled, nondilated small bowel loops are seen mainly in the left abdomen.  There is also gas and moderate stool burden is seen throughout the entire colon.  No evidence of bowel obstruction. Multiple pelvic phleboliths are identified, but no definite radiopaque calculi visualized.  IMPRESSION: Nonspecific, nonobstructive  bowel gas pattern.  Moderate stool burden noted.   Original Report Authenticated By: Danae Orleans, M.D.    Dg Abd 1 View  05/24/2012  *RADIOLOGY REPORT*  Clinical Data: Abdominal pain  ABDOMEN - 1 VIEW  Comparison: None.  Findings: Normal bowel gas pattern.  No obstruction.  There are multiple phleboliths in the central lower abdomen and pelvis.  The abdominal and pelvic soft tissues are otherwise unremarkable.  The bones are demineralized.  There are degenerative changes of the lower lumbar spine.  The lung bases are clear.  IMPRESSION: No acute findings.  No evidence of obstruction.   Original Report Authenticated By: Domenic Moras, M.D.    Ct Head Wo Contrast  06/04/2012  *RADIOLOGY  REPORT*  Clinical Data: Unresponsive  CT HEAD WITHOUT CONTRAST  Technique:  Contiguous axial images were obtained from the base of the skull through the vertex without contrast.  Comparison: 05/27/2012  Findings: The brain has a normal appearance without evidence for hemorrhage, infarction, hydrocephalus, or mass lesion.  There is no extra axial fluid collection.  The skull and paranasal sinuses are normal.  IMPRESSION: No acute intracranial abnormalities.   Original Report Authenticated By: Rosealee Albee, M.D.    Ct Head Wo Contrast  05/27/2012  *RADIOLOGY REPORT*  Clinical Data: Encephalopathy.  CT HEAD WITHOUT CONTRAST  Technique:  Contiguous axial images were obtained from the base of the skull through the vertex without contrast.  Comparison: CT head 02/16/2010.  Findings: No acute cortical infarct, hemorrhage, mass lesion is present.  The ventricles are of normal size.  No significant extra- axial fluid collection is present.  The paranasal sinuses and mastoid air cells are clear. Atherosclerotic calcifications are present within the cavernous carotid arteries.  IMPRESSION: Negative CT of the head.   Original Report Authenticated By: Jamesetta Orleans. MATTERN, M.D.    Portable Chest Xray In Am  06/05/2012  *RADIOLOGY REPORT*  Clinical Data: Post cardiac arrest  PORTABLE CHEST - 1 VIEW  Comparison: 06/04/2012; 05/26/2012; 11/01/2011  Findings: Grossly unchanged cardiac silhouette and mediastinal contours.  Stable position of endotracheal tube, overlying tracheal air column with tip above the carina.  Interval placement of enteric tube terminating inferior to the left hemidiaphragm. Interval removal of external pacing pads.  Minimally improved aeration of the bilateral upper lobes.  Worsening left basilar heterogeneous / consolidative opacities.  No definite pleural effusion or pneumothorax.  Unchanged bones.  IMPRESSION: 1.  Stable positioning of endotracheal tube.  No pneumothorax. 2.  Interval  placement of enteric tube with tip inside port terminating through the left hemidiaphragm. 3.  Worsening left basilar/retrocardiac opacities, possibly atelectasis.   Original Report Authenticated By: Waynard Reeds, M.D.    Dg Chest Port 1 View  06/04/2012  *RADIOLOGY REPORT*  Clinical Data: Respiratory failure.  Status post intubation.  PORTABLE CHEST - 1 VIEW  Comparison: Chest 05/26/2012.  Findings: Endotracheal tube is in place with the tip in the right mainstem bronchus.  Withdrawal of 2-3 cm is recommended. There is new bilateral upper lobe airspace disease. Lungs are otherwise clear.  No pneumothorax.  Heart size normal.  IMPRESSION:  1.  Right mainstem bronchus intubation.  Recommend withdrawal of2-3 cm. Finding called to Dr. Oletta Lamas at the time of interpretation. 2.  Right worse than left upper lobe airspace disease could be due to atelectasis or pneumonia.   Original Report Authenticated By: Bernadene Bell. Maricela Curet, M.D.     Current Medications:     . albuterol  2.5 mg Nebulization Q6H   And  . ipratropium  0.5 mg Nebulization Q6H  . antiseptic oral rinse  15 mL Mouth Rinse QID  . atropine  1 mg Intravenous Once  . calcium chloride  1 g Intravenous Once  . chlorhexidine  15 mL Mouth/Throat BID  . EPINEPHrine  1 mg Intravenous Once  . etomidate      . fentaNYL infusion INTRAVENOUS  10 mcg/hr Intravenous STAT  . hydrocortisone sodium succinate  100 mg Intravenous Q6H  . lidocaine (cardiac) 100 mg/30ml      . midazolam (VERSED) infusion  1 mg/hr Intravenous STAT  . pantoprazole (PROTONIX) IV  40 mg Intravenous QHS  . piperacillin-tazobactam (ZOSYN)  IV  3.375 g Intravenous Q8H  . rocuronium      . succinylcholine      . DISCONTD: aspirin  300 mg Rectal Daily  . DISCONTD: calcium chloride  1 g Intravenous Once  . DISCONTD: cisatracurium (NIMBEX) infusion  0.5-10 mcg/kg/min Intravenous STAT  . DISCONTD: epinephrine  0.5-20 mcg/min Intravenous STAT  . DISCONTD: heparin  2,000 Units  Intravenous Once    ASSESSMENT:  1. Asystole/PEA arrest s/p multiple resuscitations 2. NSTEMI 3. VDRF secondary to #1 4. Acute renal insufficiency 5. Hypomagnesemia, hypocalcemia 6. Acute blood loss anemia 2/2 BRBPR s/p pRBC x 2 units 7. Abdominal pain, n/v, GIB supporting ischemic bowel 8. Seizures 9. Temporal arteritis  -- on chronic steroids   DISCUSSION/PLAN:  Patient c/o several day history of progressive AMS, intermittent abdominal pain and n/v. Found to be initially bradycardic on EMS arrival, then converted to PEA arrest/asystole requiring CPR and epi with successful resuscitation. She was admitted by pulm/CCM yest, and is currently ventilator and dopamine-dependent. Believed to be centered around metabolic derangements/acidosis from ischemic bowel with abdominal pain, n/v and BRBPR. Cardiac biomarkers with mild elevation, then subsequent downtrend. She unfortunately had another PEA arrest req CPR yest. Dr. Delford Field discussed pt's poor prognosis with family. She has been made DNR- no further CPR or DCCV. Prior cardiac work-up unclear. Pt's son states she was evaluated "2 years" ago for a "mild heart attack during a seizure." Management limited. Further ischemic intervention and echo deferred. Heparin d/ced. She has received 2 units pRBC with H/H stabilization. Renal function improving. Supplement electrolyte deficiencies. Will discuss further recs from cardiology standpoint with MD.    Signed, R. Hurman Horn, PA-C 06/05/2012, 7:59 AM  Patient seen with PA.  Agree with the above note.  Hemodynamically, she is doing reasonably well this morning.  She is now on dopamine only and BP is stable.  She is in NSR. Hemoglobin is considerably improved and she is no longer having bloody BMs.  Suspicion for ischemic bowel.  Sedation was turned off this morning, will see if she awakens.  I will get an echocardiogram to assess LV function and we will titrate down dopamine as tolerated.   Marca Ancona 06/05/2012 9:42 AM

## 2012-06-05 NOTE — Progress Notes (Signed)
  Echocardiogram 2D Echocardiogram has been performed.  Georgian Co 06/05/2012, 2:50 PM

## 2012-06-05 NOTE — Progress Notes (Signed)
eLink Physician-Brief Progress Note Patient Name: MECHELE LOVINGER DOB: [birth year] MRN: [id number]  Date of Service  06/05/2012   HPI/Events of Note   Hyperglycemia  eICU Interventions  Hyperglycemia protocol      YACOUB,WESAM 06/05/2012, 12:17 AM

## 2012-06-05 NOTE — Progress Notes (Signed)
Wasted Fentanyl and 40ml Versed in the sink with Patent attorney.

## 2012-06-05 NOTE — H&P (Signed)
Name: [name] MRN: [id number] DOB: 12/22/1930    LOS: 1  Referring Provider: Oletta Lamas Reason for Referral:  VDRF secondary cardiac arrest.   PULMONARY / CRITICAL CARE MEDICINE  HPI:  78 yo AAF with MetS, CKD - stage 3, arthritis nnos. MGUS,  and hx of temporal ateritis who was on the commode today 06/04/2012,  vomited, and had an acute change in mental status(declined) and EMS found her marginally responsive. She then became asystole and required CPR and epinephrine injection. Pulse was returned but she remained vasopressor dependent. She was intubated and 3 port cvl placed(rt fem) per EDP. PCCM asked to assume her care 06-04-12 12:40pm.    Events Since Admission: 06/04/2012 11:33 AM > Post arrest. Admit 06/04/12 pm - Post admission PEA arrest in ICU. Made LCB  Current Status: Sedated  On vent. Critically ill. Attempting to follow commands   Vital Signs: Temp:  [90.5 F (32.5 C)-98.8 F (37.1 C)] 98.8 F (37.1 C) (10/21 1300) Pulse Rate:  [90-120] 94  (10/21 1300) Resp:  [0-35] 35  (10/21 1300) BP: (84-125)/(64-95) 84/64 mmHg (10/21 1300) SpO2:  [93 %-100 %] 99 % (10/21 1300) Arterial Line BP: (87-133)/(63-93) 89/66 mmHg (10/21 1300) FiO2 (%):  [50 %-100 %] 50.6 % (10/21 1230) Weight:  [66.5 kg (146 lb 9.7 oz)] 66.5 kg (146 lb 9.7 oz) (10/21 0500)  Physical Examination: General:  Sedated on vent, no distress Neuro:  sedated HEENT:  Left pupil 6 cm and rt 3 and NR. No LAN Neck:  No JVD Cardiovascular:  HSIR IR Lungs:  Coarse rhonchi bilat Abdomen:  Soft , no bs Musculoskeletal:  intact Skin:  cool  Principal Problem:  *Cardiac arrest Active Problems:  Acute renal failure  Monoclonal gammopathy  Temporal arteritis  Respiratory failure, acute  Metabolic acidosis  Atrial fibrillation Ct Head Wo  Contrast  06/04/2012  *RADIOLOGY REPORT*  Clinical Data: Unresponsive  CT HEAD WITHOUT CONTRAST  Technique:  Contiguous axial images were obtained from the base of the skull through the vertex without contrast.  Comparison: 05/27/2012  Findings: The brain has a normal appearance without evidence for hemorrhage, infarction, hydrocephalus, or mass lesion.  There is no extra axial fluid collection.  The skull and paranasal sinuses are normal.  IMPRESSION: No acute intracranial abnormalities.   Original Report Authenticated By: Rosealee Albee, M.D.    Portable Chest Xray In Am  06/05/2012  *RADIOLOGY REPORT*  Clinical Data: Post cardiac arrest  PORTABLE CHEST - 1 VIEW  Comparison: 06/04/2012; 05/26/2012; 11/01/2011  Findings: Grossly unchanged cardiac silhouette and mediastinal contours.  Stable position of endotracheal tube, overlying tracheal air column with tip above the carina.  Interval placement of enteric tube terminating inferior to the left hemidiaphragm. Interval removal of external pacing pads.  Minimally improved aeration of the bilateral upper lobes.  Worsening left basilar heterogeneous / consolidative opacities.  No definite pleural effusion or pneumothorax.  Unchanged bones.  IMPRESSION: 1.  Stable positioning of endotracheal tube.  No pneumothorax. 2.  Interval placement of enteric tube with tip inside port terminating through the left hemidiaphragm. 3.  Worsening left basilar/retrocardiac opacities, possibly atelectasis.   Original Report Authenticated By: Waynard Reeds, M.D.    Dg Chest Port 1 View  06/04/2012  *RADIOLOGY REPORT*  Clinical Data: Respiratory failure.  Status post intubation.  PORTABLE CHEST - 1 VIEW  Comparison: Chest 05/26/2012.  Findings: Endotracheal tube is in place with the tip in the right mainstem bronchus.  Withdrawal of 2-3 cm is recommended. There is new bilateral upper lobe airspace disease. Lungs are otherwise clear.  No pneumothorax.  Heart size normal.   IMPRESSION:  1.  Right mainstem bronchus intubation.  Recommend withdrawal of2-3 cm. Finding called to Dr. Oletta Lamas at the time of interpretation. 2.  Right worse than left upper lobe airspace disease could be due to atelectasis or pneumonia.   Original Report Authenticated By: Bernadene Bell. D'ALESSIO, M.D.      ASSESSMENT AND PLAN  PULMONARY  Lab 06/05/12 1142 06/04/12 1726 06/04/12 1721 06/04/12 1510 06/04/12 1303  PHART 7.449 7.093* -- 7.085* 6.846*  PCO2ART 31.0* 42.8 -- 40.0 61.7*  PO2ART 290.0* 277.0* -- 94.5 77.0*  HCO3 21.1 13.1* 13.3* 11.5* 11.1*  O2SAT 99.6 100.0 100.0 92.3 84.0   Ventilator Settings: Vent Mode:  [-] PRVC FiO2 (%):  [50 %-100 %] 50.6 % Set Rate:  [30 bmp-35 bmp] 35 bmp Vt Set:  [400 mL] 400 mL PEEP:  [5 cmH20-10 cmH20] 10 cmH20 Pressure Support:  [10 cmH20] 10 cmH20 Plateau Pressure:  [19 cmH20-24 cmH20] 22 cmH20   ETT:  10-20  A:  VDRF secondary to arrest, presumed cardiac or neuro.  - on 06/05/12: does not meet sbt or extubation criteria: not following commands, 70% fio2 need P:   -see vent orders -full vent supp.    CARDIOVASCULAR  Lab 06/05/12 0311 06/04/12 2020 06/04/12 1629 06/04/12 1511 06/04/12 1244 06/04/12 1130  TROPONINI 1.75* 2.05* -- 1.82* -- <0.30  LATICACIDVEN -- -- 2.0 -- -- --  PROBNP -- -- -- -- 123.7 --   ECG:  Afib, BBB, ant lat ischemia Lines:  10-20 rt fem cvl>> 10-20 lt fem aline>>  A: Presumed cardiac event  ?etiology.  Bradycardia>>PEA>>asystole. Severe hypoxia, Acute pulm edema.  Hx of CAD.   - 06/04/12 - admit and in hospital cardiac arest  P:  -per cards consult  -?PE   ? AMI  ? CVA or ICH  RENAL  Lab 06/05/12 0311 06/04/12 1724 06/04/12 1307  NA 134* 138 132*  K 3.8 5.4* --  CL 104 114* 113*  CO2 18* -- --  BUN 36* 37* 35*  CREATININE 1.45* 1.70* 1.70*  CALCIUM 7.2* -- --  MG 1.3* -- --  PHOS 4.7* -- --   Intake/Output      10/20 0701 - 10/21 0700 10/21 0701 - 10/22 0700   I.V. (mL/kg) 1823.2 (27.4)  741.5 (11.2)   Blood 413.5    IV Piggyback 50    Total Intake(mL/kg) 2286.7 (34.4) 741.5 (11.2)   Urine (mL/kg/hr) 940 (0.6) 290 (0.6)   Emesis/NG output 25    Total Output 965 290   Net +1321.7 +451.5         Foley: 10-20  A:  Renal Insuff, hyperkalemia - 06/05/12: improving P:   -monitor creatine -monitor K   GASTROINTESTINAL No results found for  this basename: AST:5,ALT:5,ALKPHOS:5,BILITOT:5,PROT:5,ALBUMIN:5 in the last 168 hours  A:  Nausea, vomiting P:   -place ogt -antiemetic  HEMATOLOGIC  Lab 06/05/12 0311 06/04/12 2345 06/04/12 1724 06/04/12 1541 06/04/12 1307 06/04/12 1100  HGB 15.4* -- 11.9* 11.4* 8.8* 15.4*  HCT 42.7 -- 35.0* 34.0* 26.0* 45.4  PLT 38* -- -- 61* -- 90*  INR -- 1.70* -- -- -- --  APTT -- 35 -- -- -- --   A:  Falling Hgb  ?source, s/p 2 unit prbc 06/04/12 and heparin drip dc;ed P:  Monitor without Heparin drip F/u cbc chk INR/PT  PTT  INFECTIOUS  Lab 06/05/12 0311 06/04/12 1541 06/04/12 1100  WBC 13.1* 13.8* 10.8*  PROCALCITON -- -- --   Cultures: None Results for orders placed during the hospital encounter of 06/04/12  MRSA PCR SCREENING     Status: Normal   Collection Time   06/04/12  7:40 PM      Component Value Range Status Comment   MRSA by PCR NEGATIVE  NEGATIVE Final    No results found for this basename: PROCALCITON:5 in the last 168 hours  Antibiotics: None Anti-infectives     Start     Dose/Rate Route Frequency Ordered Stop   06/04/12 1600  piperacillin-tazobactam (ZOSYN) IVPB 3.375 g       3.375 g 12.5 mL/hr over 240 Minutes Intravenous 3 times per day 06/04/12 1535            A:  No acute issue P:   Zosyn for presumed aspiration   ENDOCRINE  Lab 06/05/12 1009 06/05/12 0911 06/05/12 0807 06/05/12 0656 06/05/12 0606  GLUCAP 214* 231* 261* 256* 295*   A:  Presumed adrenal suppression from chronic steroids . On steroids d/t temporal arteritis P:   -stress steroids  NEUROLOGIC  A:  AMS post arrest,  ? ICH or ? CVA. CT head 06/04/12 - nil acute - 10/21: Off sedation, ? Appears to be following some commands  P:   -Change sedation gtt to prn  BEST PRACTICE / DISPOSITION Level of Care:  ICU Primary Service:  PCCM Consultants:  Cards Code Status:  Partial NCB Diet:  NPO DVT Px:  SCD GI Px:  PPI Skin Integrity:  intact Social / Family:  Son updated 06/05/12   The patient is critically ill with multiple organ systems failure and requires high complexity decision making for assessment and support, frequent evaluation and titration of therapies, application of advanced monitoring technologies and extensive interpretation of multiple databases.   Critical Care Time devoted to patient care services described in this note is  45  Minutes.  Dr. Kalman Shan, M.D., [facility].C.P Pulmonary and Critical Care Medicine Staff Physician [facility] Pager: [phone], If no answer or between  15:00h - 7:00h: call [phone]  06/05/2012 2:06 PM

## 2012-06-06 ENCOUNTER — Inpatient Hospital Stay (HOSPITAL_COMMUNITY): Payer: Medicare Other

## 2012-06-06 DIAGNOSIS — I2699 Other pulmonary embolism without acute cor pulmonale: Secondary | ICD-10-CM

## 2012-06-06 DIAGNOSIS — R0602 Shortness of breath: Secondary | ICD-10-CM

## 2012-06-06 DIAGNOSIS — I4891 Unspecified atrial fibrillation: Secondary | ICD-10-CM

## 2012-06-06 DIAGNOSIS — M316 Other giant cell arteritis: Secondary | ICD-10-CM

## 2012-06-06 LAB — LACTIC ACID, PLASMA: Lactic Acid, Venous: 2.9 mmol/L — ABNORMAL HIGH (ref 0.5–2.2)

## 2012-06-06 LAB — BLOOD GAS, ARTERIAL
Acid-base deficit: 6.6 mmol/L — ABNORMAL HIGH (ref 0.0–2.0)
Bicarbonate: 16.5 mEq/L — ABNORMAL LOW (ref 20.0–24.0)
Drawn by: 129711
FIO2: 0.4 %
O2 Saturation: 99.6 %
TCO2: 17.2 mmol/L (ref 0–100)
pCO2 arterial: 23.4 mmHg — ABNORMAL LOW (ref 35.0–45.0)
pO2, Arterial: 160 mmHg — ABNORMAL HIGH (ref 80.0–100.0)

## 2012-06-06 LAB — GLUCOSE, CAPILLARY
Glucose-Capillary: 154 mg/dL — ABNORMAL HIGH (ref 70–99)
Glucose-Capillary: 174 mg/dL — ABNORMAL HIGH (ref 70–99)
Glucose-Capillary: 138 mg/dL — ABNORMAL HIGH (ref 70–99)

## 2012-06-06 MED ORDER — VITAL 1.5 CAL PO LIQD
1000.0000 mL | ORAL | Status: DC
  Administered 2012-06-06: 1000 mL
  Filled 2012-06-06 (×3): qty 1000

## 2012-06-06 MED ORDER — SODIUM CHLORIDE 0.9 % IV SOLN
INTRAVENOUS | Status: DC
  Administered 2012-06-06: 20 mL via INTRAVENOUS

## 2012-06-06 MED ORDER — HYDROCORTISONE SOD SUCCINATE 100 MG IJ SOLR
50.0000 mg | Freq: Three times a day (TID) | INTRAMUSCULAR | Status: DC
  Administered 2012-06-06 – 2012-06-08 (×5): 50 mg via INTRAVENOUS
  Filled 2012-06-06 (×8): qty 1

## 2012-06-06 NOTE — Progress Notes (Signed)
[facility] PCCM  Called to bedside to evaluate [name] for abdominal pain after starting tube feeds.  On exam her bowel sounds are normal and she is nontender throughout, even to deep palpation.  She has a history of constipation.  At this point her belly exam is normal and reassuring.  Plan: 1) check lactic acid to look for ischemic bowel 2) KUB 3) continue tube feeds  Yolonda Kida PCCM Pager: [phone] Cell: [phone] If no response, call [phone]

## 2012-06-06 NOTE — Progress Notes (Signed)
Name: [name] MRN: [id number] DOB: October 28, 1930    LOS: 2  Referring Provider: Oletta Lamas Reason for Referral:  VDRF secondary cardiac arrest.   PULMONARY / CRITICAL CARE MEDICINE  HPI:  78 yo AAF with MetS, CKD - stage 3, arthritis nnos. MGUS,  and hx of temporal ateritis who was on the commode today 06/04/2012,  vomited, and had an acute change in mental status(declined) and EMS found her marginally responsive. She then became asystole and required CPR and epinephrine injection. Pulse was returned but she remained vasopressor dependent. She was intubated and 3 port cvl placed(rt fem) per EDP. PCCM asked to assume her care 06-04-12 12:40pm.    Events Since Admission: 06/04/2012 11:33 AM > Post arrest. Admit 06/04/12 pm - Post admission PEA arrest in ICU. Made LCB 06/05/12: Sedated  On vent. Critically ill. Attempting to follow commands  Current Status:  RASS -2 off sedation, following commands. Making urine. Not on pressors  Vital Signs: Temp:  [98.4 F (36.9 C)-100.4 F (38 C)] 98.8 F (37.1 C) (10/22 1000) Pulse Rate:  [88-104] 93  (10/22 1000) Resp:  [22-35] 22  (10/22 1000) BP: (84-159)/(64-117) 106/73 mmHg (10/22 0334) SpO2:  [99 %-100 %] 100 % (10/22 1000) Arterial Line BP: (82-130)/(60-87) 112/76 mmHg (10/22 1000) FiO2 (%):  [39.9 %-70.3 %] 40.2 % (10/22 1000) Weight:  [67.5 kg (148 lb 13 oz)] 67.5 kg (148 lb 13 oz) (10/22 0433)  Physical Examination: General:  Sedated on vent, no distress Neuro:  RASS -2 off sedation HEENT:  Left pupil 6 cm and rt 3 and NR. No LAN Neck:  No JVD Cardiovascular:  HSIR IR Lungs:  Coarse rhonchi bilat Abdomen:  Soft , no bs Musculoskeletal:  intact Skin:  cool  Principal Problem:  *Cardiac arrest Active Problems:  Acute renal failure  Monoclonal gammopathy  Temporal  arteritis  Respiratory failure, acute  Metabolic acidosis  Atrial fibrillation Ct Head Wo Contrast  06/04/2012  *RADIOLOGY REPORT*  Clinical Data: Unresponsive  CT HEAD WITHOUT CONTRAST  Technique:  Contiguous axial images were obtained from the base of the skull through the vertex without contrast.  Comparison: 05/27/2012  Findings: The brain has a normal appearance without evidence for hemorrhage, infarction, hydrocephalus, or mass lesion.  There is no extra axial fluid collection.  The skull and paranasal sinuses are normal.  IMPRESSION: No acute intracranial abnormalities.   Original Report Authenticated By: Rosealee Albee, M.D.    Portable Chest Xray In Am  06/05/2012  *RADIOLOGY REPORT*  Clinical Data: Post cardiac arrest  PORTABLE CHEST - 1 VIEW  Comparison: 06/04/2012; 05/26/2012; 11/01/2011  Findings: Grossly unchanged cardiac silhouette and mediastinal contours.  Stable position of endotracheal tube, overlying tracheal air column with tip above the carina.  Interval placement of enteric tube terminating inferior to the left hemidiaphragm. Interval removal of external pacing pads.  Minimally  improved aeration of the bilateral upper lobes.  Worsening left basilar heterogeneous / consolidative opacities.  No definite pleural effusion or pneumothorax.  Unchanged bones.  IMPRESSION: 1.  Stable positioning of endotracheal tube.  No pneumothorax. 2.  Interval placement of enteric tube with tip inside port terminating through the left hemidiaphragm. 3.  Worsening left basilar/retrocardiac opacities, possibly atelectasis.   Original Report Authenticated By: Waynard Reeds, M.D.    Dg Chest Port 1 View  06/04/2012  *RADIOLOGY REPORT*  Clinical Data: Respiratory failure.  Status post intubation.  PORTABLE CHEST - 1 VIEW  Comparison: Chest 05/26/2012.  Findings: Endotracheal tube is in place with the tip in the right mainstem bronchus.  Withdrawal of 2-3 cm is recommended. There is new bilateral upper  lobe airspace disease. Lungs are otherwise clear.  No pneumothorax.  Heart size normal.  IMPRESSION:  1.  Right mainstem bronchus intubation.  Recommend withdrawal of2-3 cm. Finding called to Dr. Oletta Lamas at the time of interpretation. 2.  Right worse than left upper lobe airspace disease could be due to atelectasis or pneumonia.   Original Report Authenticated By: Bernadene Bell. D'ALESSIO, M.D.      ASSESSMENT AND PLAN  PULMONARY  Lab 06/06/12 0947 06/05/12 1142 06/04/12 1726 06/04/12 1721 06/04/12 1510 06/04/12 1303  PHART 7.462* 7.449 7.093* -- 7.085* 6.846*  PCO2ART 23.4* 31.0* 42.8 -- 40.0 61.7*  PO2ART 160.0* 290.0* 277.0* -- 94.5 77.0*  HCO3 16.5* 21.1 13.1* 13.3* 11.5* --  O2SAT 99.6 99.6 100.0 100.0 92.3 --   Ventilator Settings: Vent Mode:  [-] PRVC FiO2 (%):  [39.9 %-70.3 %] 40.2 % Set Rate:  [35 bmp] 35 bmp Vt Set:  [400 mL] 400 mL PEEP:  [5 cmH20-10 cmH20] 5 cmH20 Pressure Support:  [10 cmH20] 10 cmH20 Plateau Pressure:  [22 cmH20-23 cmH20] 23 cmH20   ETT:  10-20  A:  VDRF secondary to arrest, presumed cardiac or neuro.  - on 06/05/12: does not meet sbt or extubation criteria: not following commands, 70% fio2 need - on 06/06/12: Fio2 down 40%/peep5. Does not meet sbt criteria due to mental status P:   -see vent orders -full vent supp.    CARDIOVASCULAR  Lab 06/05/12 0311 06/04/12 2020 06/04/12 1629 06/04/12 1511 06/04/12 1244 06/04/12 1130  TROPONINI 1.75* 2.05* -- 1.82* -- <0.30  LATICACIDVEN -- -- 2.0 -- -- --  PROBNP -- -- -- -- 123.7 --   ECG:  Afib, BBB, ant lat ischemia Lines:  10-20 rt fem cvl>> 10-20 lt fem aline>>  A: Presumed cardiac event  ?etiology.  Bradycardia>>PEA>>asystole. Severe hypoxia, Acute pulm edema.  Hx of CAD.  -?PE   ? AMI  ? CVA or ICH - 06/04/12 - admit and in hospital cardiac arest - echo 06/05/12: Systolic function was normal. The estimated ejection fraction was in the range of 60% to 65%. Doppler parameters are consistent with  abnormal left ventricular relaxation (grade 1 diastolic dysfunction). - Right ventricle: RV appears dilated. RV function appears depressed but cannot evaluate fully as not well seen in all views. - Tricuspid valve: Mild-moderate regurgitation. - Pulmonary arteries: PA peak pressure: 35mm Hg (S).   - 06/06/12: normal BP  P:  -  Check doppler legs  -r ule out DVT (due to RV dilatatiion) -per cards consult - d./w DR Shirlee Latch who will re-evaluate    RENAL  Lab 06/05/12 0311 06/04/12 1724 06/04/12 1307  NA 134* 138 132*  K 3.8 5.4* --  CL 104 114* 113*  CO2  18* -- --  BUN 36* 37* 35*  CREATININE 1.45* 1.70* 1.70*  CALCIUM 7.2* -- --  MG 1.3* -- --  PHOS 4.7* -- --   Intake/Output      10/21 0701 - 10/22 0700 10/22 0701 - 10/23 0700   I.V. (mL/kg) 1393.9 (20.7) 60 (0.9)   Blood     IV Piggyback 200    Total Intake(mL/kg) 1593.9 (23.6) 60 (0.9)   Urine (mL/kg/hr) 856 (0.5)    Emesis/NG output     Total Output 856    Net +737.9 +60         Foley: 10-20  A:  Renal Insuff, hyperkalemia - 06/05/12 and 10/22: improving P:   -monitor creatine -monitor K   GASTROINTESTINAL No results found for this basename: AST:5,ALT:5,ALKPHOS:5,BILITOT:5,PROT:5,ALBUMIN:5 in the last 168 hours  A:  Nausea, vomiting at admit  - 06/06/2012: no vomit  P:   -start tube feeds -antiemetic  HEMATOLOGIC  Lab 06/05/12 0311 06/04/12 2345 06/04/12 1724 06/04/12 1541 06/04/12 1307 06/04/12 1100  HGB 15.4* -- 11.9* 11.4* 8.8* 15.4*  HCT 42.7 -- 35.0* 34.0* 26.0* 45.4  PLT 38* -- -- 61* -- 90*  INR -- 1.70* -- -- -- --  APTT -- 35 -- -- -- --   A:  Falling Hgb 10;20/13:  ?source, s/p 2 unit prbc 06/04/12 and heparin drip dc'ed Thrombocytopneia since 06/04/12 on 06/04/2012  (Plat  Was 132 on 05/27/12, 172 on 05/24/12 , admitted 05/26/12 - 05/29/12 for encephalopathy)) P:  Monitor without Heparin drip  CHeck HIT panel Check duplex legs F/u cbc chk INR/PT  PTT  INFECTIOUS  Lab 06/05/12  0311 06/04/12 1541 06/04/12 1100  WBC 13.1* 13.8* 10.8*  PROCALCITON -- -- --   Cultures: None Results for orders placed during the hospital encounter of 06/04/12  MRSA PCR SCREENING     Status: Normal   Collection Time   06/04/12  7:40 PM      Component Value Range Status Comment   MRSA by PCR NEGATIVE  NEGATIVE Final    No results found for this basename: PROCALCITON:5 in the last 168 hours  Antibiotics: None Anti-infectives     Start     Dose/Rate Route Frequency Ordered Stop   06/04/12 1600  piperacillin-tazobactam (ZOSYN) IVPB 3.375 g       3.375 g 12.5 mL/hr over 240 Minutes Intravenous 3 times per day 06/04/12 1535            A:  No acute issue P:   Zosyn for presumed aspiration   ENDOCRINE  Lab 06/06/12 0800 06/06/12 0428 06/05/12 2336 06/05/12 2024 06/05/12 1625  GLUCAP 174* 158* 162* 154* 110*   A:  Presumed adrenal suppression from chronic steroids . On steroids d/t temporal arteritis P:   -stress steroids but taper to 50mg  Q8h fro 100mg  q6h on 06/06/12  NEUROLOGIC  A:  AMS post arrest, ? ICH or ? CVA. CT head 06/04/12 - nil acute - 10/21 and 06/06/12  Off sedation, ? Appears to be following some commands  P:   -Change sedation gtt to prn  BEST PRACTICE / DISPOSITION Level of Care:  ICU Primary Service:  PCCM Consultants:  Cards Code Status:  Partial NCB Diet:  NPO DVT Px:  SCD GI Px:  PPI Skin Integrity:  intact Social / Family:  Son updated 06/05/12. None at bedside 06/06/12   The patient is critically ill with multiple organ systems failure and requires high complexity decision making for assessment and support,  frequent evaluation and titration of therapies, application of advanced monitoring technologies and extensive interpretation of multiple databases.   Critical Care Time devoted to patient care services described in this note is  45  Minutes.  Dr. Kalman Shan, M.D., [facility].C.P Pulmonary and Critical Care Medicine Staff  Physician [facility] [place] [facility] Pager: [phone], If no answer or between  15:00h - 7:00h: call [phone]  06/06/2012 11:43 AM

## 2012-06-06 NOTE — Progress Notes (Signed)
Nutrition Follow-up/consult  Intervention:   1. Initiate Vital 1.5 @ 20 ml/hr via OG tube and increase by 10 ml every 8 hours to goal rate of 30 ml/hr. 30 ml Prostat 4 times daily.  At goal rate, tube feeding regimen will provide 1480 kcal, 109 grams of protein, and 550 ml of H2O.  ** slow advancement given ?of ischemic bowel  2. May require additional free water flushes if no IVF  3. RD will continue to follow     Assessment:   MD notes that pt no longer with emesis. RD consulted for initiation and management of EN.  Pt has OG tube in place.  Previous noted indicated concern for ischemic bowel. MD notes no bowel sounds. Will initiate elemental EN formula for better tolerance.   Patient remains on vent support  MV: 9.2 Temp:Temp (24hrs), Avg:99.5 F (37.5 C), Min:98.4 F (36.9 C), Max:100.4 F (38 C)  Propofol: none    Diet Order:  NPO Supplements/TF: none  Last BM: no BM's documented this admission   Meds: Scheduled Meds:   . albuterol  2.5 mg Nebulization Q6H   And  . ipratropium  0.5 mg Nebulization Q6H  . antiseptic oral rinse  15 mL Mouth Rinse QID  . chlorhexidine  15 mL Mouth/Throat BID  . dextrose      . hydrocortisone sodium succinate  50 mg Intravenous Q8H  . insulin aspart  0-9 Units Subcutaneous Q4H  . pantoprazole (PROTONIX) IV  40 mg Intravenous QHS  . piperacillin-tazobactam (ZOSYN)  IV  3.375 g Intravenous Q8H  . DISCONTD: hydrocortisone sodium succinate  100 mg Intravenous Q6H   Continuous Infusions:   . sodium chloride    . DISCONTD: sodium chloride 75 mL/hr at 06/04/12 1521  . DISCONTD: dextrose    . DISCONTD: dextrose Stopped (06/06/12 1000)  . DISCONTD: DOPamine Stopped (06/06/12 1000)  . DISCONTD: insulin (NOVOLIN-R) infusion Stopped (06/05/12 1400)  . DISCONTD: vasopressin (PITRESSIN) infusion - *FOR SHOCK* Stopped (06/04/12 2310)   PRN Meds:.sodium chloride, dextrose, fentaNYL, DISCONTD: dextrose, DISCONTD: midazolam  Labs:  CMP       Component Value Date/Time   NA 134* 06/05/2012 0311   K 3.8 06/05/2012 0311   CL 104 06/05/2012 0311   CO2 18* 06/05/2012 0311   GLUCOSE 322* 06/05/2012 0311   BUN 36* 06/05/2012 0311   CREATININE 1.45* 06/05/2012 0311   CALCIUM 7.2* 06/05/2012 0311   PROT 8.8* 11/01/2011 1945   ALBUMIN 3.2* 11/01/2011 1945   AST 17 11/01/2011 1945   ALT 12 11/01/2011 1945   ALKPHOS 58 11/01/2011 1945   BILITOT 0.3 11/01/2011 1945   GFRNONAA 33* 06/05/2012 0311   GFRAA 39* 06/05/2012 0311     Intake/Output Summary (Last 24 hours) at 06/06/12 1225 Last data filed at 06/06/12 1000  Gross per 24 hour  Intake  936.3 ml  Output    566 ml  Net  370.3 ml    Weight Status:  148 lbs, trending up from admission with fluids. (+2 L)  Re-estimated needs:  1486 kcal, 100-125 gm protein  Nutrition Dx:  Inadequate oral intake r/t inability to eat AEB mechanical ventilation  Goal:  Meet >90% estimated nutrition needs. --unmet   Monitor:  EN rate/tolerance, weight, labs, vent status   Clarene Duke RD, LDN Pager [phone] After Hours pager [phone]

## 2012-06-06 NOTE — Progress Notes (Signed)
Bilateral:  No evidence of DVT, superficial thrombosis, or Baker's Cyst.   

## 2012-06-07 ENCOUNTER — Inpatient Hospital Stay (HOSPITAL_COMMUNITY): Payer: Medicare Other

## 2012-06-07 DIAGNOSIS — I214 Non-ST elevation (NSTEMI) myocardial infarction: Secondary | ICD-10-CM

## 2012-06-07 LAB — BASIC METABOLIC PANEL
CO2: 24 mEq/L (ref 19–32)
Chloride: 99 mEq/L (ref 96–112)
Glucose, Bld: 185 mg/dL — ABNORMAL HIGH (ref 70–99)
Potassium: 2.9 mEq/L — ABNORMAL LOW (ref 3.5–5.1)
Sodium: 137 mEq/L (ref 135–145)

## 2012-06-07 LAB — CBC
HCT: 36.7 % (ref 36.0–46.0)
HCT: 37.1 % (ref 36.0–46.0)
Hemoglobin: 12.9 g/dL (ref 12.0–15.0)
Hemoglobin: 13.1 g/dL (ref 12.0–15.0)
MCV: 88.4 fL (ref 78.0–100.0)
MCV: 90 fL (ref 78.0–100.0)
RDW: 15 % (ref 11.5–15.5)
RDW: 15.4 % (ref 11.5–15.5)
WBC: 13.3 10*3/uL — ABNORMAL HIGH (ref 4.0–10.5)
WBC: 13.6 10*3/uL — ABNORMAL HIGH (ref 4.0–10.5)

## 2012-06-07 LAB — AMYLASE: Amylase: 124 U/L — ABNORMAL HIGH (ref 0–105)

## 2012-06-07 LAB — GLUCOSE, CAPILLARY
Glucose-Capillary: 150 mg/dL — ABNORMAL HIGH (ref 70–99)
Glucose-Capillary: 125 mg/dL — ABNORMAL HIGH (ref 70–99)
Glucose-Capillary: 140 mg/dL — ABNORMAL HIGH (ref 70–99)

## 2012-06-07 LAB — PROCALCITONIN: Procalcitonin: 1.95 ng/mL

## 2012-06-07 LAB — OCCULT BLOOD X 1 CARD TO LAB, STOOL: Fecal Occult Bld: POSITIVE

## 2012-06-07 LAB — PROTIME-INR
INR: 1.41 (ref 0.00–1.49)
Prothrombin Time: 16.9 seconds — ABNORMAL HIGH (ref 11.6–15.2)

## 2012-06-07 LAB — PHOSPHORUS: Phosphorus: 3.2 mg/dL (ref 2.3–4.6)

## 2012-06-07 LAB — CK: Total CK: 99 U/L (ref 7–177)

## 2012-06-07 LAB — MAGNESIUM: Magnesium: 1.8 mg/dL (ref 1.5–2.5)

## 2012-06-07 MED ORDER — FENTANYL CITRATE 0.05 MG/ML IJ SOLN
25.0000 ug | INTRAMUSCULAR | Status: DC | PRN
  Administered 2012-06-07: 50 ug via INTRAVENOUS
  Filled 2012-06-07: qty 2

## 2012-06-07 MED ORDER — ONDANSETRON HCL 4 MG/2ML IJ SOLN
4.0000 mg | Freq: Three times a day (TID) | INTRAMUSCULAR | Status: DC | PRN
  Administered 2012-06-07: 4 mg via INTRAVENOUS
  Filled 2012-06-07: qty 2

## 2012-06-07 MED ORDER — MAGNESIUM SULFATE 50 % IJ SOLN: 1.0000 g | Freq: Once | INTRAVENOUS | Status: DC

## 2012-06-07 MED ORDER — IPRATROPIUM-ALBUTEROL 18-103 MCG/ACT IN AERO
6.0000 | INHALATION_SPRAY | Freq: Four times a day (QID) | RESPIRATORY_TRACT | Status: DC
  Administered 2012-06-07 (×2): 6 via RESPIRATORY_TRACT
  Filled 2012-06-07: qty 14.7

## 2012-06-07 MED ORDER — ALBUTEROL SULFATE (5 MG/ML) 0.5% IN NEBU
2.5000 mg | INHALATION_SOLUTION | Freq: Four times a day (QID) | RESPIRATORY_TRACT | Status: DC
  Administered 2012-06-07 – 2012-06-11 (×14): 2.5 mg via RESPIRATORY_TRACT
  Filled 2012-06-07 (×15): qty 0.5

## 2012-06-07 MED ORDER — METOCLOPRAMIDE HCL 5 MG/ML IJ SOLN
5.0000 mg | Freq: Three times a day (TID) | INTRAMUSCULAR | Status: DC | PRN
  Filled 2012-06-07: qty 1

## 2012-06-07 MED ORDER — POTASSIUM CHLORIDE 10 MEQ/50ML IV SOLN
10.0000 meq | INTRAVENOUS | Status: AC
  Administered 2012-06-07 (×6): 10 meq via INTRAVENOUS
  Filled 2012-06-07: qty 50
  Filled 2012-06-07: qty 250

## 2012-06-07 MED ORDER — POTASSIUM CHLORIDE 20 MEQ/15ML (10%) PO LIQD: 40.0000 meq | ORAL | Status: DC

## 2012-06-07 MED ORDER — IPRATROPIUM BROMIDE 0.02 % IN SOLN
0.5000 mg | Freq: Four times a day (QID) | RESPIRATORY_TRACT | Status: DC
  Administered 2012-06-07 – 2012-06-11 (×14): 0.5 mg via RESPIRATORY_TRACT
  Filled 2012-06-07 (×15): qty 2.5

## 2012-06-07 MED ORDER — MAGNESIUM SULFATE IN D5W 10-5 MG/ML-% IV SOLN
1.0000 g | Freq: Once | INTRAVENOUS | Status: AC
  Administered 2012-06-07: 1 g via INTRAVENOUS
  Filled 2012-06-07: qty 100

## 2012-06-07 NOTE — Progress Notes (Signed)
Patient Name: [name] Date of Encounter: 06/07/2012   78 y/o woman with h/o CAD (s/p previous stent), CRI, CVA, temporal arteritis admitted with confusion and abdominal pain. Suffered PEA arrest with VDRF and a/c renal failure.  SUBJECTIVE:   Extubated today. Awake follows commands. Only mildly confused. No CP or SOB. Off pressors.   ECHO: - Left ventricle: The cavity size was mildly reduced. Wall thickness was normal. Systolic function was normal. The estimated ejection fraction was in the range of 60% to 65%. Doppler parameters are consistent with abnormal left ventricular relaxation (grade 1 diastolic dysfunction). - Right ventricle: RV appears dilated. RV function appears depressed but cannot evaluate fully as not well seen in all views. - Tricuspid valve: Mild-moderate regurgitation. - Pulmonary arteries: PA peak pressure: 35mm Hg (S).     OBJECTIVE  Filed Vitals:   06/07/12 1200 06/07/12 1300 06/07/12 1405 06/07/12 1406  BP:      Pulse: 89 87  94  Temp: 98.8 F (37.1 C) 98.8 F (37.1 C)  98.6 F (37 C)  TempSrc:      Resp: 27 23  24   Height:      Weight:      SpO2: 100% 99% 98% 98%    Intake/Output Summary (Last 24 hours) at 06/07/12 1641 Last data filed at 06/07/12 1400  Gross per 24 hour  Intake 1002.5 ml  Output   1020 ml  Net  -17.5 ml   Weight change: -0.9 kg (-1 lb 15.8 oz)  PHYSICAL EXAM  General: Elderly, awake Head: Normocephalic, atraumatic, sclera non-icteric, no xanthomas, nares are without discharge.  Neck: Supple without bruits or + R EJ catheter Lungs:  clear Heart: RRR no s3, s4, or murmurs. Abdomen: Soft, non-tender, non-distended, normoactive BS in all 4 quads.  Msk:  Strength and tone appears normal for age. Extremities: Cool extremities peripherally. No clubbing, cyanosis or edema. DP/PT/Radials 2+ and equal bilaterally. Neuro: Sedated.  Psych: Sedated.  LABS:  Recent Labs  [facility] 06/07/12 0002 06/05/12 0311     WBC 13.6* 13.1*   HGB 13.1 15.4*   HCT 36.7 42.7   MCV 88.4 88.6   PLT 50* 38*    Lab 06/07/12 1030 06/07/12 0450 06/05/12 0311 06/04/12 1724  NA -- 137 134* 138  K -- 2.9* 3.8 5.4*  CL -- 99 104 114*  CO2 -- 24 18* --  BUN -- 41* 36* 37*  CREATININE -- 1.86* 1.45* 1.70*  CALCIUM -- 8.2* 7.2* --  PROT -- -- -- --  BILITOT -- -- -- --  ALKPHOS -- -- -- --  ALT -- -- -- --  AST -- -- -- --  AMYLASE 124* -- -- --  LIPASE 35 -- -- --  GLUCOSE -- 185* 322* 282*   Recent Labs  Basename 06/07/12 1030 06/05/12 0311 06/04/12 2020   CKTOTAL 99 -- --   CKMB -- -- --   CKMBINDEX -- -- --   TROPONINI -- 1.75* 2.05*    TELE: sinus tachycardia, low 100 to 1-teens.    Radiology/Studies:  Dg Chest 2 View  05/26/2012  *RADIOLOGY REPORT*  Clinical Data: Chest pain and shortness of breath.  Altered mental status.  CHEST - 2 VIEW  Comparison: Chest x-ray 11/01/2011.  Findings: Lung volumes are normal.  No consolidative airspace disease.  No pleural effusions.  No pneumothorax.  No pulmonary nodule or mass noted.  Pulmonary vasculature and the cardiomediastinal silhouette are within normal limits.  IMPRESSION: 1. No  radiographic evidence of acute cardiopulmonary disease.   Original Report Authenticated By: Florencia Reasons, M.D.    Dg Abd 1 View  05/27/2012  *RADIOLOGY REPORT*  Clinical Data: Abdominal pain.  ABDOMEN - 1 VIEW  Comparison: 05/24/2012  Findings: Multiple gas filled, nondilated small bowel loops are seen mainly in the left abdomen.  There is also gas and moderate stool burden is seen throughout the entire colon.  No evidence of bowel obstruction. Multiple pelvic phleboliths are identified, but no definite radiopaque calculi visualized.  IMPRESSION: Nonspecific, nonobstructive bowel gas pattern.  Moderate stool burden noted.   Original Report Authenticated By: Danae Orleans, M.D.    Dg Abd 1 View  05/24/2012  *RADIOLOGY REPORT*  Clinical Data: Abdominal pain  ABDOMEN - 1 VIEW   Comparison: None.  Findings: Normal bowel gas pattern.  No obstruction.  There are multiple phleboliths in the central lower abdomen and pelvis.  The abdominal and pelvic soft tissues are otherwise unremarkable.  The bones are demineralized.  There are degenerative changes of the lower lumbar spine.  The lung bases are clear.  IMPRESSION: No acute findings.  No evidence of obstruction.   Original Report Authenticated By: Domenic Moras, M.D.    Ct Head Wo Contrast  06/04/2012  *RADIOLOGY REPORT*  Clinical Data: Unresponsive  CT HEAD WITHOUT CONTRAST  Technique:  Contiguous axial images were obtained from the base of the skull through the vertex without contrast.  Comparison: 05/27/2012  Findings: The brain has a normal appearance without evidence for hemorrhage, infarction, hydrocephalus, or mass lesion.  There is no extra axial fluid collection.  The skull and paranasal sinuses are normal.  IMPRESSION: No acute intracranial abnormalities.   Original Report Authenticated By: Rosealee Albee, M.D.    Ct Head Wo Contrast  05/27/2012  *RADIOLOGY REPORT*  Clinical Data: Encephalopathy.  CT HEAD WITHOUT CONTRAST  Technique:  Contiguous axial images were obtained from the base of the skull through the vertex without contrast.  Comparison: CT head 02/16/2010.  Findings: No acute cortical infarct, hemorrhage, mass lesion is present.  The ventricles are of normal size.  No significant extra- axial fluid collection is present.  The paranasal sinuses and mastoid air cells are clear. Atherosclerotic calcifications are present within the cavernous carotid arteries.  IMPRESSION: Negative CT of the head.   Original Report Authenticated By: Jamesetta Orleans. MATTERN, M.D.    Portable Chest Xray In Am  06/05/2012  *RADIOLOGY REPORT*  Clinical Data: Post cardiac arrest  PORTABLE CHEST - 1 VIEW  Comparison: 06/04/2012; 05/26/2012; 11/01/2011  Findings: Grossly unchanged cardiac silhouette and mediastinal contours.  Stable  position of endotracheal tube, overlying tracheal air column with tip above the carina.  Interval placement of enteric tube terminating inferior to the left hemidiaphragm. Interval removal of external pacing pads.  Minimally improved aeration of the bilateral upper lobes.  Worsening left basilar heterogeneous / consolidative opacities.  No definite pleural effusion or pneumothorax.  Unchanged bones.  IMPRESSION: 1.  Stable positioning of endotracheal tube.  No pneumothorax. 2.  Interval placement of enteric tube with tip inside port terminating through the left hemidiaphragm. 3.  Worsening left basilar/retrocardiac opacities, possibly atelectasis.   Original Report Authenticated By: Waynard Reeds, M.D.    Dg Chest Port 1 View  06/04/2012  *RADIOLOGY REPORT*  Clinical Data: Respiratory failure.  Status post intubation.  PORTABLE CHEST - 1 VIEW  Comparison: Chest 05/26/2012.  Findings: Endotracheal tube is in place with the tip in the  right mainstem bronchus.  Withdrawal of 2-3 cm is recommended. There is new bilateral upper lobe airspace disease. Lungs are otherwise clear.  No pneumothorax.  Heart size normal.  IMPRESSION:  1.  Right mainstem bronchus intubation.  Recommend withdrawal of2-3 cm. Finding called to Dr. Oletta Lamas at the time of interpretation. 2.  Right worse than left upper lobe airspace disease could be due to atelectasis or pneumonia.   Original Report Authenticated By: Bernadene Bell. Maricela Curet, M.D.     Current Medications:     . albuterol-ipratropium  6 puff Inhalation Q6H  . antiseptic oral rinse  15 mL Mouth Rinse QID  . chlorhexidine  15 mL Mouth/Throat BID  . hydrocortisone sodium succinate  50 mg Intravenous Q8H  . insulin aspart  0-9 Units Subcutaneous Q4H  . magnesium sulfate 1 - 4 g bolus IVPB  1 g Intravenous Once  . pantoprazole (PROTONIX) IV  40 mg Intravenous QHS  . piperacillin-tazobactam (ZOSYN)  IV  3.375 g Intravenous Q8H  . potassium chloride  10 mEq Intravenous Q1 Hr x 6    . DISCONTD: albuterol  2.5 mg Nebulization Q6H  . DISCONTD: feeding supplement (VITAL 1.5 CAL)  1,000 mL Per Tube Q24H  . DISCONTD: ipratropium  0.5 mg Nebulization Q6H  . DISCONTD: magnesium sulfate LVP 250-500 ml  1 g Intravenous Once  . DISCONTD: potassium chloride  40 mEq Per Tube Q1 Hr x 2    ASSESSMENT:  1. Asystole/PEA arrest s/p multiple resuscitations 2. NSTEMI - peak trop 2.05 3. VDRF secondary to #1 4. Acute/chronic renal insufficiency 5. Hypomagnesemia, hypocalcemia 6. Acute blood loss anemia 2/2 BRBPR s/p pRBC x 2 units 7. Abdominal pain, n/v, GIB supporting ischemic bowel 8. Seizures 9. Temporal arteritis  -- on chronic steroids 10/ DNR   DISCUSSION/PLAN:  Improving steadily. Now extubated and off pressors. Would continue supportive care for now. Etiology of PEA arrest is unclear. Suspect NSTEMI was demand ischemia and not primary event. Would not cath at this point. Given dilated RV on echo would consider VQ scan. (will d/w p/ccm).   Will continue to follow. Will need extensive rehab.  Reuel Boom Ayo Smoak 06/07/2012 4:41 PM

## 2012-06-07 NOTE — Progress Notes (Signed)
Lactate, amylase, liopase ok  Will extubate Will add some reglan for nausea

## 2012-06-07 NOTE — Progress Notes (Signed)
Left femoral arterial line removed and hemostasis achieved at 4:16pm. Held manual pressure for 15 minutes.  Site a level 0 prior to sheath pull.  After 15 minutes of manual pressure, site remains a level 0.  Pt tolerated procedure well.  Positive DP pulse on left foot. Vital signs stable throughout procedure. Instructions given to pt to notify nurse if any bleeding occurs.  Dara Hoyer

## 2012-06-07 NOTE — Progress Notes (Signed)
Name: [name] MRN: [id number] DOB: 04/15/1931    LOS: 3 Date of admit. 06/04/2012 PCP is [name], MD   Referring Provider: [facility] Reason for Referral:  VDRF secondary cardiac arrest.   PULMONARY / CRITICAL CARE MEDICINE  HPI:  78 yo AAF with MetS, CKD - stage 3, arthritis nnos. MGUS,  and hx of temporal ateritis who was on the commode today 06/04/2012,  vomited, and had an acute change in mental status(declined) and EMS found her marginally responsive. She then became asystole and required CPR and epinephrine injection. Pulse was returned but she remained vasopressor dependent. She was intubated and 3 port cvl placed(rt fem) per EDP. PCCM asked to assume her care 06-04-12 12:40pm.    Events Since Admission: 06/04/2012 11:33 AM > Post arrest. Admit 06/04/12 pm - Post admission PEA arrest in ICU. Made LCB 06/05/12: Sedated  On vent. Critically ill. Attempting to follow commands. ECHO  RV dilatation. LVEF normal 10/22: RASS -2 off sedation, following commands. Making urine. Not on pressors. NO DVT    Current Status:  06/07/12: Did well on SBT except mild occ anxiety. Following commnad off sedation. RASS -1. .  Vital Signs: Temp:  [98.1 F (36.7 C)-99.9 F (37.7 C)] 98.8 F (37.1 C) (10/23 0900) Pulse Rate:  [80-117] 98  (10/23 0900) Resp:  [20-31] 20  (10/23 0900) BP: (118-149)/(64-89) 118/64 mmHg (10/23 0329) SpO2:  [97 %-100 %] 100 % (10/23 0900) Arterial Line BP: (102-157)/(62-91) 149/87 mmHg (10/23 0900) FiO2 (%):  [30 %-40.5 %] 30.1 % (10/23 0900) Weight:  [66.6 kg (146 lb 13.2 oz)] 66.6 kg (146 lb 13.2 oz) (10/23 0454)  Physical Examination: General:  Sedated on vent, no distress Neuro:  RASS -1 off sedation.f ollows commands HEENT:  Left pupil 6 cm and rt 3 and NR. No LAN Neck:  No JVD Cardiovascular:   HSIR IR Lungs:  Coarse rhonchi bilat Abdomen:  Soft , no bs Musculoskeletal:  intact Skin:  cool  Principal Problem:  *Cardiac arrest Active Problems:  Acute renal failure  Monoclonal gammopathy  Temporal arteritis  Respiratory failure, acute  Metabolic acidosis  Atrial fibrillation Dg Chest Port 1 View  06/07/2012  *RADIOLOGY REPORT*  Clinical Data: Check endotracheal tube  PORTABLE CHEST - 1 VIEW  Comparison: 06/05/2012  Findings: Endotracheal tube in satisfactory position.  NG in the stomach.  Increased bibasilar atelectasis since the prior study.  Pulmonary vascularity is normal.  No definite edema.  IMPRESSION: Increase in bibasilar atelectasis.   Original Report Authenticated By: Camelia Phenes, M.D.    Dg Abd Portable 1v  06/06/2012  *RADIOLOGY REPORT*  Clinical Data: Abdominal pain, post cardiac arrest  PORTABLE ABDOMEN - 1 VIEW  Comparison: Abdomen films of 05/27/2012  Findings: A portable film of the abdomen shows the tip of the NG tube to be within the body of the  stomach.  The bowel gas pattern is nonspecific.  IMPRESSION: Nonspecific bowel gas pattern.  NG tube tip in body of stomach.   Original Report Authenticated By: Juline Patch, M.D.      ASSESSMENT AND PLAN  PULMONARY  Lab 06/06/12 0947 06/05/12 1142 06/04/12 1726 06/04/12 1721 06/04/12 1510 06/04/12 1303  PHART 7.462* 7.449 7.093* -- 7.085* 6.846*  PCO2ART 23.4* 31.0* 42.8 -- 40.0 61.7*  PO2ART 160.0* 290.0* 277.0* -- 94.5 77.0*  HCO3 16.5* 21.1 13.1* 13.3* 11.5* --  O2SAT 99.6 99.6 100.0 100.0 92.3 --   Ventilator Settings: Vent Mode:  [-] PRVC;PSV FiO2 (%):  [30 %-40.5 %] 30.1 % Set Rate:  [22 bmp] 22 bmp Vt Set:  [400 mL] 400 mL PEEP:  [5 cmH20] 5 cmH20 Pressure Support:  [5 cmH20] 5 cmH20 Plateau Pressure:  [13 cmH20-22 cmH20] 16 cmH20   ETT:  10-20  A:  VDRF secondary to arrest, presumed cardiac or neuro.  - on 06/05/12: does not meet sbt or extubation criteria: not following commands, 70%  fio2 need - on 06/06/12: Fio2 down 40%/peep5. Does not meet sbt criteria due to mental status  - on 06/07/12: meets sbt and extubation criteria P:   -extubate (if lactic acid ok)  CARDIOVASCULAR  Lab 06/06/12 2018 06/05/12 0311 06/04/12 2020 06/04/12 1629 06/04/12 1511 06/04/12 1244 06/04/12 1130  TROPONINI -- 1.75* 2.05* -- 1.82* -- <0.30  LATICACIDVEN 2.9* -- -- 2.0 -- -- --  PROBNP -- -- -- -- -- 123.7 --   ECG:  Afib, BBB, ant lat ischemia Lines:  10-20 rt fem cvl>> 10-20 lt fem aline>>  A: Presumed cardiac event  ?etiology.  Bradycardia>>PEA>>asystole. Severe hypoxia, Acute pulm edema.  Hx of CAD.  -?PE   ? AMI  ? CVA or ICH - 06/04/12 - admit and in hospital cardiac arest - echo 06/05/12: Systolic function was normal. The estimated ejection fraction was in the range of 60% to 65%. Doppler parameters are consistent with abnormal left ventricular relaxation (grade 1 diastolic dysfunction). - Right ventricle: RV appears dilated. RV function appears depressed but cannot evaluate fully as not well seen in all views. - Tricuspid valve: Mild-moderate regurgitation. - Pulmonary arteries: PA peak pressure: 35mm Hg (S).   - 06/06/12 and 06/07/12: normal BP. No  dVT on doppler 06/06/12  P:  -per cards consult - d./w DR Shirlee Latch on 06/06/12 who will re-evaluate - re=doppler 06/09/12    RENAL  Lab 06/07/12 0450 06/05/12 0311 06/04/12 1724 06/04/12 1307  NA 137 134* 138 132*  K 2.9* 3.8 -- --  CL 99 104 114* 113*  CO2 24 18* -- --  BUN 41* 36* 37* 35*  CREATININE 1.86* 1.45* 1.70* 1.70*  CALCIUM 8.2* 7.2* -- --  MG 1.8 1.3* -- --  PHOS 3.2 4.7* -- --   Intake/Output      10/22 0701 - 10/23 0700 10/23 0701 - 10/24 0700   I.V. (mL/kg) 310 (4.7) 20 (0.3)   NG/GT 180    IV Piggyback 200 112.5   Total Intake(mL/kg) 690 (10.4) 132.5 (2)   Urine (mL/kg/hr) 820 (0.5) 100   Emesis/NG output 100    Total Output 920 100   Net -230 +32.5         Foley: 10-20  A:  Renal  Insuff, hyperkalemia - 06/07/12: 06/07/2012: Low K repleted and low mag  P:   -monitor creatine and luytes - replete mag  GASTROINTESTINAL No results found for this basename: AST:5,ALT:5,ALKPHOS:5,BILITOT:5,PROT:5,ALBUMIN:5 in the  last 168 hours  A:  Nausea, vomiting at admit  - 06/07/2012: vomited on 06/06/12 and tube feeds held. KUB okay  P:   - cotninue to hold tube feeds  - check stat CK, amylase, lipase and lactate (extubate if these are ok)   HEMATOLOGIC  Lab 06/07/12 0450 06/07/12 0002 06/05/12 0311 06/04/12 2345 06/04/12 1724 06/04/12 1541 06/04/12 1307 06/04/12 1100  HGB -- 13.1 15.4* -- 11.9* 11.4* 8.8* --  HCT -- 36.7 42.7 -- 35.0* 34.0* 26.0* --  PLT -- 50* 38* -- -- 61* -- 90*  INR 1.41 -- -- 1.70* -- -- -- --  APTT -- -- -- 35 -- -- -- --   A:  Falling Hgb 10;20/13:  ?source, s/p 2 unit prbc 06/04/12 and heparin drip dc'ed Thrombocytopneia since 06/04/12   (she was  admitted 05/26/12 - 05/29/12 for encephalopathy and during this time Plat  Was 132 on 05/27/12, 172 on 10/9/1 )  - on 06/07/2012 : anemia unchanged. Platelets improving. Duplex LE negatie 06/06/12   P:  Monitor without Heparin drip  Await HIT panel from 06/06/12 Recheck duplex legs 06/09/12 F/u cbc chk INR/PT  PTT  INFECTIOUS  Lab 06/07/12 0002 06/05/12 0311 06/04/12 1541 06/04/12 1100  WBC 13.6* 13.1* 13.8* 10.8*  PROCALCITON -- -- -- --   Cultures: None Results for orders placed during the hospital encounter of 06/04/12  MRSA PCR SCREENING     Status: Normal   Collection Time   06/04/12  7:40 PM      Component Value Range Status Comment   MRSA by PCR NEGATIVE  NEGATIVE Final    No results found for this basename: PROCALCITON:5 in the last 168 hours  Antibiotics: None Anti-infectives     Start     Dose/Rate Route Frequency Ordered Stop   06/04/12 1600  piperacillin-tazobactam (ZOSYN) IVPB 3.375 g       3.375 g 12.5 mL/hr over 240 Minutes Intravenous 3 times per day 06/04/12  1535            A:  No acute issue P:   Zosyn for presumed aspiration Check PCT algortitm to decide abx stop date   ENDOCRINE  Lab 06/07/12 0748 06/07/12 0417 06/07/12 0008 06/06/12 1951 06/06/12 1659  GLUCAP 125* 181* 150* 124* 138*   A:  Presumed adrenal suppression from chronic steroids . On steroids d/t temporal arteritis P:   -stress steroids but taper to 50mg  Q8h fro 100mg  q6h on 06/06/12  NEUROLOGIC  A:  AMS post arrest, ? ICH or ? CVA. CT head 06/04/12 - nil acute - 10/21 and 06/06/12  Off sedation, ? Appears to be following some commands - 06/07/12: following commands  P:   -Change sedation gtt to prn  BEST PRACTICE / DISPOSITION Level of Care:  ICU Primary Service:  PCCM Consultants:  Cards Code Status:  Partial NCB Diet:  NPO DVT Px:  SCD GI Px:  PPI Skin Integrity:  intact Social / Family:  Son updated 06/05/12. None at bedside 06/06/12 and 06/07/12   The patient is critically ill with multiple organ systems failure and requires high complexity decision making for assessment and support, frequent evaluation and titration of therapies, application of advanced monitoring technologies and extensive interpretation of multiple databases.   Critical Care Time devoted to patient care services described in this note is  45  Minutes.  Dr. Kalman Shan, M.D., [facility].C.P Pulmonary and Critical Care Medicine Staff Physician [facility]  Pager: [phone], If no answer or between  15:00h - 7:00h: call [phone]  06/07/2012 9:36 AM

## 2012-06-07 NOTE — Progress Notes (Signed)
Extubated patient to 2L [place] pt is stable RT will continue to monitor.

## 2012-06-07 NOTE — Progress Notes (Signed)
ANTIBIOTIC CONSULT NOTE - FOLLOW UP  Pharmacy Consult for Zosyn Indication: aspiration PNA  Allergies  Allergen Reactions  . Lactose Intolerance (Gi) Diarrhea and Nausea Only   Vital Signs: Temp: 99 F (37.2 C) (10/23 1130) Temp src: Core (Comment) (10/23 0800) BP: 118/64 mmHg (10/23 0329) Pulse Rate: 86  (10/23 1130) Intake/Output from previous day: 10/22 0701 - 10/23 0700 In: 690 [I.V.:310; NG/GT:180; IV Piggyback:200] Out: 920 [Urine:820; Emesis/NG output:100] Intake/Output from this shift: Total I/O In: 352.5 [I.V.:40; IV Piggyback:312.5] Out: 100 [Urine:100]  Labs:  Basename 06/07/12 0450 06/07/12 0002 06/05/12 0311 06/04/12 1724 06/04/12 1541  WBC -- 13.6* 13.1* -- 13.8*  HGB -- 13.1 15.4* 11.9* --  PLT -- 50* 38* -- 61*  LABCREA -- -- -- -- --  CREATININE 1.86* -- 1.45* 1.70* --   Estimated Creatinine Clearance: 22 ml/min (by C-G formula based on Cr of 1.86).  Assessment: 81yof continues on day #4 zosyn for presumed aspiration pna following cardiac arrest. Renal function is worse today and CrCl is borderline for adjustment. If CrCl falls below 70ml/min tomorrow, will need to adjust dose. Noted plan for PCT algorithm to determine LOT.   Goal of Therapy:  Appropriate zosyn dosing  Plan:  1) Continue zosyn 3.375g IV q8 (4h infusion) 2) Follow up renal function, LOT  Fredrik Rigger 06/07/2012,12:31 PM

## 2012-06-07 NOTE — Progress Notes (Signed)
Pt began vomiting this am. This is pts second episode. Tube feeds stopped during both episodes and NG attached to low-wall intermittent suction. NG remains attached to suction. Elink notified. Orders received for zofran and intermittent sedation. Will continue to monitor.  Perkins, [place] Elizabeth

## 2012-06-08 ENCOUNTER — Inpatient Hospital Stay (HOSPITAL_COMMUNITY): Payer: Medicare Other

## 2012-06-08 DIAGNOSIS — E872 Acidosis: Secondary | ICD-10-CM

## 2012-06-08 DIAGNOSIS — I214 Non-ST elevation (NSTEMI) myocardial infarction: Secondary | ICD-10-CM

## 2012-06-08 DIAGNOSIS — R609 Edema, unspecified: Secondary | ICD-10-CM

## 2012-06-08 LAB — GLUCOSE, CAPILLARY
Glucose-Capillary: 102 mg/dL — ABNORMAL HIGH (ref 70–99)
Glucose-Capillary: 114 mg/dL — ABNORMAL HIGH (ref 70–99)
Glucose-Capillary: 115 mg/dL — ABNORMAL HIGH (ref 70–99)
Glucose-Capillary: 98 mg/dL (ref 70–99)

## 2012-06-08 LAB — BASIC METABOLIC PANEL
BUN: 34 mg/dL — ABNORMAL HIGH (ref 6–23)
Calcium: 8.7 mg/dL (ref 8.4–10.5)
Chloride: 100 mEq/L (ref 96–112)
Creatinine, Ser: 1.68 mg/dL — ABNORMAL HIGH (ref 0.50–1.10)
GFR calc Af Amer: 32 mL/min — ABNORMAL LOW (ref >90)
GFR calc non Af Amer: 28 mL/min — ABNORMAL LOW (ref >90)

## 2012-06-08 LAB — CBC
HCT: 36.9 % (ref 36.0–46.0)
Hemoglobin: 12.8 g/dL (ref 12.0–15.0)
MCHC: 34.7 g/dL (ref 30.0–36.0)
RBC: 4.03 MIL/uL (ref 3.87–5.11)
WBC: 12 10*3/uL — ABNORMAL HIGH (ref 4.0–10.5)

## 2012-06-08 LAB — HEPARIN INDUCED THROMBOCYTOPENIA PNL
Patient O.D.: 0.049
UFH High Dose UFH H: 1 % Release
UFH SRA Result: NEGATIVE

## 2012-06-08 LAB — PHOSPHORUS: Phosphorus: 2.6 mg/dL (ref 2.3–4.6)

## 2012-06-08 LAB — PROCALCITONIN: Procalcitonin: 1.29 ng/mL

## 2012-06-08 MED ORDER — POTASSIUM CHLORIDE 10 MEQ/50ML IV SOLN
10.0000 meq | INTRAVENOUS | Status: AC
  Administered 2012-06-08 (×4): 10 meq via INTRAVENOUS
  Filled 2012-06-08 (×2): qty 100

## 2012-06-08 MED ORDER — TECHNETIUM TO 99M ALBUMIN AGGREGATED
3.0000 | Freq: Once | INTRAVENOUS | Status: AC | PRN
  Administered 2012-06-08: 3 via INTRAVENOUS

## 2012-06-08 MED ORDER — HYDROCORTISONE SOD SUCCINATE 100 MG IJ SOLR
50.0000 mg | Freq: Two times a day (BID) | INTRAMUSCULAR | Status: DC
  Administered 2012-06-08 – 2012-06-09 (×2): 50 mg via INTRAVENOUS
  Filled 2012-06-08 (×4): qty 1

## 2012-06-08 MED ORDER — HEPARIN (PORCINE) IN NACL 100-0.45 UNIT/ML-% IJ SOLN
950.0000 [IU]/h | INTRAMUSCULAR | Status: DC
  Administered 2012-06-08: 950 [IU]/h via INTRAVENOUS
  Filled 2012-06-08: qty 250

## 2012-06-08 MED ORDER — FENTANYL CITRATE 0.05 MG/ML IJ SOLN
12.5000 ug | INTRAMUSCULAR | Status: DC | PRN
  Administered 2012-06-08 (×2): 25 ug via INTRAVENOUS
  Filled 2012-06-08 (×2): qty 2

## 2012-06-08 MED ORDER — HEPARIN BOLUS VIA INFUSION
3000.0000 [IU] | Freq: Once | INTRAVENOUS | Status: AC
  Administered 2012-06-08: 3000 [IU] via INTRAVENOUS
  Filled 2012-06-08: qty 3000

## 2012-06-08 MED ORDER — HYDRALAZINE HCL 20 MG/ML IJ SOLN
10.0000 mg | INTRAMUSCULAR | Status: DC | PRN
  Administered 2012-06-08 – 2012-06-09 (×2): 20 mg via INTRAVENOUS
  Administered 2012-06-10: 10 mg via INTRAVENOUS
  Administered 2012-06-12: 20 mg via INTRAVENOUS
  Filled 2012-06-08 (×4): qty 1

## 2012-06-08 MED ORDER — BIOTENE DRY MOUTH MT LIQD
15.0000 mL | Freq: Two times a day (BID) | OROMUCOSAL | Status: DC
  Administered 2012-06-08 – 2012-06-14 (×10): 15 mL via OROMUCOSAL

## 2012-06-08 MED ORDER — XENON XE 133 GAS
16.0000 | GAS_FOR_INHALATION | Freq: Once | RESPIRATORY_TRACT | Status: AC | PRN
  Administered 2012-06-08: 16 via RESPIRATORY_TRACT

## 2012-06-08 NOTE — Progress Notes (Signed)
ANTICOAGULATION CONSULT NOTE - Initial Consult  Pharmacy Consult for heparin Indication: suspected PE  Allergies  Allergen Reactions  . Lactose Intolerance (Gi) Diarrhea and Nausea Only    Patient Measurements: Height: 5\' 2"  (157.5 cm) Weight: 142 lb 13.7 oz (64.8 kg) IBW/kg (Calculated) : 50.1  Heparin Dosing Weight: 64.8 kg  Vital Signs: Temp: 98.4 F (36.9 C) (10/24 2000) Temp src: Oral (10/24 2000) BP: 127/85 mmHg (10/24 2200) Pulse Rate: 88  (10/24 2200)  Labs:  Basename 06/08/12 1327 06/08/12 0400 06/07/12 2335 06/07/12 1030 06/07/12 0450 06/07/12 0002  HGB -- 12.8 12.9 -- -- --  HCT -- 36.9 37.1 -- -- 36.7  PLT -- 63* 64* -- -- 50*  APTT -- -- -- -- -- --  LABPROT -- 15.5* -- -- 16.9* --  INR -- 1.25 -- -- 1.41 --  HEPARINUNFRC -- -- -- -- -- --  CREATININE 1.68* -- -- -- 1.86* --  CKTOTAL -- -- -- 99 -- --  CKMB -- -- -- -- -- --  TROPONINI -- -- -- -- -- --    Estimated Creatinine Clearance: 24 ml/min (by C-G formula based on Cr of 1.68).   Medical History: Past Medical History  Diagnosis Date  . Arthritis   . Hypertension   . Coronary artery disease     a. Neg stress test 2008. b. ?questionable history - pt thought she had heart cath in 1970's but details were unclear.  . Stroke     a. Details unclear.  . Seizures     a. Due to hyponatremia, resolved.  Marland Kitchen History of gout   . Glaucoma   . Cataracts, bilateral   . Acute kidney injury   . CKD (chronic kidney disease), stage III   . Hyperlipidemia   . Gallstones   . Cerebrovascular disease   . Hyponatremia     a. H/o 2011 ?SIADH with seizures, encephalopathy at that time.  . Temporal arteritis     Medications:  Scheduled:    . albuterol  2.5 mg Nebulization Q6H  . antiseptic oral rinse  15 mL Mouth Rinse BID  . hydrocortisone sodium succinate  50 mg Intravenous Q12H  . insulin aspart  0-9 Units Subcutaneous Q4H  . ipratropium  0.5 mg Nebulization Q6H  . pantoprazole (PROTONIX) IV  40 mg  Intravenous QHS  . piperacillin-tazobactam (ZOSYN)  IV  3.375 g Intravenous Q8H  . potassium chloride  10 mEq Intravenous Q1 Hr x 4  . DISCONTD: antiseptic oral rinse  15 mL Mouth Rinse QID  . DISCONTD: chlorhexidine  15 mL Mouth/Throat BID  . DISCONTD: hydrocortisone sodium succinate  50 mg Intravenous Q8H   Infusions:    . sodium chloride Stopped (06/06/12 1300)    Assessment: 78 yo female with suspected PE will be put on heparin therapy.  INR today was 1.25.  No current anticoagulation Goal of Therapy:  Heparin level 0.3-0.7 units/ml Monitor platelets by anticoagulation protocol: Yes   Plan:  1) Heparin bolus 3000 units iv x1, then start heparin at 950 units/hr 2) Check an 8 hour heparin level. 3) daily heparin level and CBC  Cleland Simkins, Tsz-Yin 06/08/2012,10:17 PM

## 2012-06-08 NOTE — Progress Notes (Signed)
Chaplain visited patient as a follow up. Patient was responsive and very alert. Family were visiting with patient during Chaplain's visit. Chaplain celebrated patient's progress with family and glorify God. Chaplain also gave patient a prayer shawl as a gift from [facility], and prayed with patient and family. Patient and family members expressed their appreciation for Chaplain's visit and spiritual support. Chaplain will continue to provide spiritual care to patient and family as needed.

## 2012-06-08 NOTE — Care Management Note (Signed)
    Page 1 of 1   06/08/2012     2:34:29 PM   CARE MANAGEMENT NOTE 06/08/2012  Patient:  IWONA, FORESTIER   Account Number:  [id number]  Date Initiated:  06/05/2012  Documentation initiated by:  Junius Creamer  Subjective/Objective Assessment:   adm w cardiac arrest     Action/Plan:   lives w children, pcp dr Concepcion Elk, act w [facility]   Anticipated DC Date:     Anticipated DC Plan:  HOME W HOME HEALTH SERVICES      DC Planning Services  CM consult      [facility]  Resumption Of Svcs/PTA Provider   Choice offered to / List presented to:          [facility] arranged  HH-1 RN      [facility] agency  [facility]   Status of service:   Medicare Important Message given?   (If response is "NO", the following Medicare IM given date fields will be blank) Date Medicare IM given:   Date Additional Medicare IM given:    Discharge Disposition:  HOME W HOME HEALTH SERVICES  Per UR Regulation:  Reviewed for med. necessity/level of care/duration of stay  If discussed at Long Length of Stay Meetings, dates discussed:    Comments:  10/24 14:33p debbie Bianney [place] rn,bsn received call from [facility] that they are act w pt. [facility] rep will follow and resume care after disch.  10/21 13:35p debbie Clancy Leiner rn,bsn [phone]

## 2012-06-08 NOTE — Progress Notes (Signed)
VASCULAR LAB PRELIMINARY  PRELIMINARY  PRELIMINARY  PRELIMINARY  Bilateral lower extremity venous Dopplers completed.    Preliminary report:  There is no DVT or SVT noted in the bilateral lower extremities.  No change in results since study done 10.22.13.  Lycia Sachdeva, 06/08/2012, 10:30 AM

## 2012-06-08 NOTE — Progress Notes (Addendum)
Nutrition Follow-up Intervention:    1. If EN desired, recommend start Vital 1.5 @ 20 and advance by 10 ml q 8 hrs to a goal rate of 40 ml/hr. Also 30 ml Pro-stat once daily via tube. This EN regimen will provide 1540 kcal, 80 gm protein, and 733 ml free water. Will require additional free water to meet hydration needs if no IVF.   2. If oral diet desired, recommend swallow evaluation   3. RD will continue to follow    Assessment:   Had some episodes of vomiting while on TF, TF held 2 times for this. Pt s/p extubation and OG out/TF stopped on 10/23. Pt with confusion post extubation. Remains NPO, per RN may place NG for feeding.  If EN started, recommend slow advancement and semi-elemental formula for best tolerance. If pt continues to vomit, may need a post pyloric feeding tube (can be placed by IR).   Diet Order:  NPO  Meds: Scheduled Meds:   . albuterol  2.5 mg Nebulization Q6H  . antiseptic oral rinse  15 mL Mouth Rinse BID  . hydrocortisone sodium succinate  50 mg Intravenous Q12H  . insulin aspart  0-9 Units Subcutaneous Q4H  . ipratropium  0.5 mg Nebulization Q6H  . pantoprazole (PROTONIX) IV  40 mg Intravenous QHS  . piperacillin-tazobactam (ZOSYN)  IV  3.375 g Intravenous Q8H  . potassium chloride  10 mEq Intravenous Q1 Hr x 6  . DISCONTD: albuterol-ipratropium  6 puff Inhalation Q6H  . DISCONTD: antiseptic oral rinse  15 mL Mouth Rinse QID  . DISCONTD: chlorhexidine  15 mL Mouth/Throat BID  . DISCONTD: feeding supplement (VITAL 1.5 CAL)  1,000 mL Per Tube Q24H  . DISCONTD: hydrocortisone sodium succinate  50 mg Intravenous Q8H   Continuous Infusions:   . sodium chloride Stopped (06/06/12 1300)   PRN Meds:.sodium chloride, metoCLOPramide (REGLAN) injection, ondansetron, DISCONTD: fentaNYL  Labs:  CMP     Component Value Date/Time   NA 137 06/07/2012 0450   K 2.9* 06/07/2012 0450   CL 99 06/07/2012 0450   CO2 24 06/07/2012 0450   GLUCOSE 185* 06/07/2012 0450   BUN 41* 06/07/2012 0450   CREATININE 1.86* 06/07/2012 0450   CALCIUM 8.2* 06/07/2012 0450   PROT 8.8* 11/01/2011 1945   ALBUMIN 3.2* 11/01/2011 1945   AST 17 11/01/2011 1945   ALT 12 11/01/2011 1945   ALKPHOS 58 11/01/2011 1945   BILITOT 0.3 11/01/2011 1945   GFRNONAA 25* 06/07/2012 0450   GFRAA 29* 06/07/2012 0450     Intake/Output Summary (Last 24 hours) at 06/08/12 1359 Last data filed at 06/08/12 1315  Gross per 24 hour  Intake  492.5 ml  Output   1805 ml  Net -1312.5 ml    Weight Status:  142 lbs, trending down x 3 days, still above admission weight   Re-estimated needs: 1400-1600 kcal, 75-90 gm protein   Nutrition Dx:  Inadequate oral intake now r/t decreased level of alertness AEB NPO orders   Goal:  Meet >90% estimated nutrition needs.  --unmet   Monitor:  EN initiation, weight, labs, diet advance    Clarene Duke RD, LDN Pager [phone] After Hours pager [phone]

## 2012-06-08 NOTE — Progress Notes (Signed)
Name: [name] MRN: [id number] DOB: 02/28/1931    LOS: 4 Date of admit. 06/04/2012 PCP is [name], MD   Referring Provider: [facility] Reason for Referral:  VDRF secondary cardiac arrest.   PULMONARY / CRITICAL CARE MEDICINE  HPI:  78 yo AAF with MetS, CKD - stage 3, arthritis nnos. MGUS,  and hx of temporal ateritis who was on the commode today 06/04/2012,  vomited, and had an acute change in mental status(declined) and EMS found her marginally responsive. She then became asystole and required CPR and epinephrine injection. Pulse was returned but she remained vasopressor dependent. She was intubated and 3 port cvl placed(rt fem) per EDP. PCCM asked to assume her care 06-04-12 12:40pm.    Events Since Admission: 06/04/2012 11:33 AM > Post arrest. Admit 06/04/12 pm - Post admission PEA arrest in ICU. Made LCB 06/05/12: Sedated  On vent. Critically ill. Attempting to follow commands. ECHO  RV dilatation. LVEF normal 10/22: RASS -2 off sedation, following commands. Making urine. Not on pressors. NO DVT 06/07/12 - extubated 06/08/12 - repeat dopplers - negative DVT again    Current Status:  06/08/12: extubated yesterday. Maintaining airway. Hypoactive delirium +.   Vital Signs: Temp:  [97.4 F (36.3 C)-99.1 F (37.3 C)] 97.8 F (36.6 C) (10/24 0800) Pulse Rate:  [81-105] 90  (10/24 1000) Resp:  [11-27] 24  (10/24 1000) BP: (136-165)/(89-107) 165/95 mmHg (10/24 1000) SpO2:  [90 %-99 %] 96 % (10/24 1000) Arterial Line BP: (132-149)/(74-85) 149/85 mmHg (10/23 1600) Weight:  [64.8 kg (142 lb 13.7 oz)] 64.8 kg (142 lb 13.7 oz) (10/24 0448)  Physical Examination: General:  Sedated on vent, no distress Neuro:  RASS -1 off sedation. follows commands. CAM-ICU POSITIVE for delirum HEENT:  Left pupil 6 cm and rt 3 and NR. No  LAN Neck:  No JVD Cardiovascular:  HSIR IR Lungs:  Coarse rhonchi bilat Abdomen:  Soft , no bs Musculoskeletal:  intact Skin:  cool  Principal Problem:  *Cardiac arrest Active Problems:  Acute renal failure  Monoclonal gammopathy  Temporal arteritis  Respiratory failure, acute  Metabolic acidosis  Atrial fibrillation  NSTEMI (non-ST elevated myocardial infarction) Dg Chest Port 1 View  06/07/2012  *RADIOLOGY REPORT*  Clinical Data: Check endotracheal tube  PORTABLE CHEST - 1 VIEW  Comparison: 06/05/2012  Findings: Endotracheal tube in satisfactory position.  NG in the stomach.  Increased bibasilar atelectasis since the prior study.  Pulmonary vascularity is normal.  No definite edema.  IMPRESSION: Increase in bibasilar atelectasis.   Original Report Authenticated By: Camelia Phenes, M.D.    Dg Abd Portable 1v  06/06/2012  *RADIOLOGY REPORT*  Clinical Data: Abdominal pain, post cardiac arrest  PORTABLE ABDOMEN - 1 VIEW  Comparison: Abdomen films of 05/27/2012  Findings: A portable film of the abdomen shows the tip of the NG tube to be within  the body of the stomach.  The bowel gas pattern is nonspecific.  IMPRESSION: Nonspecific bowel gas pattern.  NG tube tip in body of stomach.   Original Report Authenticated By: Juline Patch, M.D.      ASSESSMENT AND PLAN  PULMONARY  Lab 06/06/12 0947 06/05/12 1142 06/04/12 1726 06/04/12 1721 06/04/12 1510 06/04/12 1303  PHART 7.462* 7.449 7.093* -- 7.085* 6.846*  PCO2ART 23.4* 31.0* 42.8 -- 40.0 61.7*  PO2ART 160.0* 290.0* 277.0* -- 94.5 77.0*  HCO3 16.5* 21.1 13.1* 13.3* 11.5* --  O2SAT 99.6 99.6 100.0 100.0 92.3 --   Ventilator Settings:     ETT:  10-20  A:  VDRF secondary to arrest, presumed cardiac or neuro.  - on 06/05/12: does not meet sbt or extubation criteria: not following commands, 70% fio2 need - on 06/06/12: Fio2 down 40%/peep5. Does not meet sbt criteria due to mental status  - on 06/07/12: exttubated P:    -monitor  CARDIOVASCULAR  Lab 06/07/12 0947 06/06/12 2018 06/05/12 0311 06/04/12 2020 06/04/12 1629 06/04/12 1511 06/04/12 1244 06/04/12 1130  TROPONINI -- -- 1.75* 2.05* -- 1.82* -- <0.30  LATICACIDVEN 1.7 2.9* -- -- 2.0 -- -- --  PROBNP -- -- -- -- -- -- 123.7 --   ECG:  Afib, BBB, ant lat ischemia Lines:  10-20 rt fem cvl>> 10-20 lt fem aline>> ? date  A: Presumed cardiac event  ?etiology.  Bradycardia>>PEA>>asystole. Severe hypoxia, Acute pulm edema.  Hx of CAD.  -?PE   ? AMI  ? CVA or ICH - 06/04/12 - admit and in hospital cardiac arest - echo 06/05/12: Systolic function was normal. The estimated ejection fraction was in the range of 60% to 65%. Doppler parameters are consistent with abnormal left ventricular relaxation (grade 1 diastolic dysfunction). - Right ventricle: RV appears dilated. RV function appears depressed but cannot evaluate fully as not well seen in all views. - Tricuspid valve: Mild-moderate regurgitation. - Pulmonary arteries: PA peak pressure: 35mm Hg (S).   - 06/06/12 and 06/07/12: normal BP. No  dVT on doppler 06/06/12 and 06/08/12  P:  Given negative dopplers, get VQ scan to pursue PE workup   RENAL  Lab 06/07/12 0450 06/05/12 0311 06/04/12 1724 06/04/12 1307  NA 137 134* 138 132*  K 2.9* 3.8 -- --  CL 99 104 114* 113*  CO2 24 18* -- --  BUN 41* 36* 37* 35*  CREATININE 1.86* 1.45* 1.70* 1.70*  CALCIUM 8.2* 7.2* -- --  MG 1.8 1.3* -- --  PHOS 3.2 4.7* -- --   Intake/Output      10/23 0701 - 10/24 0700 10/24 0701 - 10/25 0700   I.V. (mL/kg) 480 (7.4) 60 (0.9)   NG/GT     IV Piggyback 412.5    Total Intake(mL/kg) 892.5 (13.8) 60 (0.9)   Urine (mL/kg/hr) 1325 (0.9) 450 (1.4)   Emesis/NG output     Total Output 1325 450   Net -432.5 -390        Stool Occurrence 3 x     Foley: 10-20  A:  Renal Insuff, hyperkalemia - 06/07/12: : Low K  and low mag repleted  P:   -monitor creatine and luytes - recheck lytes  06/08/12  GASTROINTESTINAL No results found for this basename: AST:5,ALT:5,ALKPHOS:5,BILITOT:5,PROT:5,ALBUMIN:5 in the last 168 hours  A:  Nausea, vomiting at admit  - 06/07/2012: vomited on 06/06/12 and tube feeds held. Labs and KUB okay - 06/08/12 - no vomit  P:   - cotninue npo duee  to hypoative delirum  HEMATOLOGIC  Lab 06/08/12 0400 06/07/12 2335 06/07/12 0450 06/07/12 0002 06/05/12 0311 06/04/12 2345 06/04/12 1724 06/04/12 1541  HGB 12.8 12.9 -- 13.1 15.4* -- 11.9* --  HCT 36.9 37.1 -- 36.7 42.7 -- 35.0* --  PLT 63* 64* -- 50* 38* -- -- 61*  INR 1.25 -- 1.41 -- -- 1.70* -- --  APTT -- -- -- -- -- 35 -- --   A:  Falling Hgb 10;20/13:  ?source, s/p 2 unit prbc 06/04/12 and heparin drip dc'ed Thrombocytopneia since 06/04/12   (she was  admitted 05/26/12 - 05/29/12 for encephalopathy and during this time Plat  Was 132 on 05/27/12, 172 on 10/9/1 )  - on 06/08/2012 : anemia unchanged. Platelets improving. Duplex LE negatie 06/06/12 and 06/08/12   P:  Monitor without Heparin drip  Await HIT panel from 06/06/12 F/u cbc chk INR/PT  PTT  INFECTIOUS  Lab 06/08/12 0400 06/07/12 2335 06/07/12 1030 06/07/12 0002 06/05/12 0311 06/04/12 1541  WBC 12.0* 13.3* -- 13.6* 13.1* 13.8*  PROCALCITON 1.29 -- 1.95 -- -- --   Cultures: None Results for orders placed during the hospital encounter of 06/04/12  MRSA PCR SCREENING     Status: Normal   Collection Time   06/04/12  7:40 PM      Component Value Range Status Comment   MRSA by PCR NEGATIVE  NEGATIVE Final     Lab 06/08/12 0400 06/07/12 1030  PROCALCITON 1.29 1.95    Antibiotics: None Anti-infectives     Start     Dose/Rate Route Frequency Ordered Stop   06/04/12 1600  piperacillin-tazobactam (ZOSYN) IVPB 3.375 g       3.375 g 12.5 mL/hr over 240 Minutes Intravenous 3 times per day 06/04/12 1535            A:  No acute issue P:   Zosyn for presumed aspiration; continue as of 06/08/12 Monitor  PCT algortitm to  decide abx stop date   ENDOCRINE  Lab 06/08/12 0840 06/08/12 0343 06/07/12 2340 06/07/12 1932 06/07/12 1636  GLUCAP 98 115* 109* 108* 140*   A:  Presumed adrenal suppression from chronic steroids . On steroids d/t temporal arteritis P:   -stress steroids but taper to 50mg  Q8h fro 100mg  q6h on 06/06/12 and to 50mg  Q12h on 06/08/12  NEUROLOGIC  A:  AMS post arrest, ? ICH or ? CVA. CT head 06/04/12 - nil acute - 10/21 and 06/06/12  Off sedation, ? Appears to be following some commands - 06/07/12: following commands - 06/08/12 - hypoactive delrium  P:   -dc fent prn - monitor - kvo fluid  BEST PRACTICE / DISPOSITION Level of Care:  ICU Primary Service:  PCCM Consultants:  Cards Code Status:  Partial NCB Diet:  NPO DVT Px:  SCD GI Px:  PPI Skin Integrity:  intact Social / Family:  Son updated 06/05/12. None at bedside 06/06/12 and 06/07/12   The patient is critically ill with multiple organ systems failure and requires high complexity decision making for assessment and support, frequent evaluation and titration of therapies, application of advanced monitoring technologies and extensive interpretation of multiple databases.   Critical Care Time devoted to patient care services described in this note is  45  Minutes.  Dr. Kalman Shan, M.D., [facility].C.P Pulmonary and Critical Care Medicine Staff Physician [facility] Pager: [phone], If no answer or between  15:00h - 7:00h: call [phone]  06/08/2012 12:07 PM

## 2012-06-08 NOTE — Progress Notes (Signed)
Patient ID: [name], female   DOB: 1931-07-05, 78 y.o.   MRN: [id number]  78 y/o woman with h/o CAD (s/p previous stent), CRI, CVA, temporal arteritis admitted with confusion and abdominal pain. Suffered PEA arrest with VDRF and a/c renal failure.  SUBJECTIVE:   Extubated yesterday.  Some confusion, "there is a man in my nose". No CP or SOB. Off pressors.   ECHO: - Left ventricle: The cavity size was mildly reduced. Wall thickness was normal. Systolic function was normal. The estimated ejection fraction was in the range of 60% to 65%. Doppler parameters are consistent with abnormal left ventricular relaxation (grade 1 diastolic dysfunction). - Right ventricle: RV appears dilated. RV function appears depressed but cannot evaluate fully as not well seen in all views. - Tricuspid valve: Mild-moderate regurgitation. - Pulmonary arteries: PA peak pressure: 35mm Hg (S).     OBJECTIVE  Filed Vitals:   06/08/12 0448 06/08/12 0500 06/08/12 0600 06/08/12 0700  BP:  140/96 148/95 149/96  Pulse:  91 96 93  Temp:      TempSrc:      Resp:  23 22 25   Height:      Weight: 142 lb 13.7 oz (64.8 kg)     SpO2:  94% 90% 95%    Intake/Output Summary (Last 24 hours) at 06/08/12 0752 Last data filed at 06/08/12 0600  Gross per 24 hour  Intake  822.5 ml  Output   1325 ml  Net -502.5 ml   Weight change: -3 lb 15.5 oz (-1.8 kg)  PHYSICAL EXAM  General: Elderly, awake Head: Normocephalic, atraumatic, sclera non-icteric, no xanthomas, nares are without discharge.  Neck: Supple without bruits, JVP 10 cm Lungs:  clear Heart: RRR no s3, s4, or murmurs. Abdomen: Soft, non-tender, non-distended, normoactive BS in all 4 quads.  Msk:  Strength and tone appears normal for age. Extremities: Cool extremities peripherally. No clubbing, cyanosis or edema. DP/PT/Radials 2+ and equal bilaterally. Neuro: Sedated.  Psych: Sedated.  LABS:  Recent Labs  Basename 06/08/12 0400 06/07/12 2335   WBC 12.0* 13.3*   HGB 12.8 12.9   HCT 36.9 37.1   MCV 91.6 90.0   PLT 63* 64*    Lab 06/07/12 1030 06/07/12 0450 06/05/12 0311 06/04/12 1724  NA -- 137 134* 138  K -- 2.9* 3.8 5.4*  CL -- 99 104 114*  CO2 -- 24 18* --  BUN -- 41* 36* 37*  CREATININE -- 1.86* 1.45* 1.70*  CALCIUM -- 8.2* 7.2* --  PROT -- -- -- --  BILITOT -- -- -- --  ALKPHOS -- -- -- --  ALT -- -- -- --  AST -- -- -- --  AMYLASE 124* -- -- --  LIPASE 35 -- -- --  GLUCOSE -- 185* 322* 282*   Recent Labs  Basename 06/07/12 1030   CKTOTAL 99   CKMB --   CKMBINDEX --   TROPONINI --    TELE: sinus tachycardia, low 100 to 1-teens.    Radiology/Studies:  Dg Chest 2 View  05/26/2012  *RADIOLOGY REPORT*  Clinical Data: Chest pain and shortness of breath.  Altered mental status.  CHEST - 2 VIEW  Comparison: Chest x-ray 11/01/2011.  Findings: Lung volumes are normal.  No consolidative airspace disease.  No pleural effusions.  No pneumothorax.  No pulmonary nodule or mass noted.  Pulmonary vasculature and the cardiomediastinal silhouette are within normal limits.  IMPRESSION: 1. No radiographic evidence of acute cardiopulmonary disease.   Original Report Authenticated  By: Florencia Reasons, M.D.    Dg Abd 1 View  05/27/2012  *RADIOLOGY REPORT*  Clinical Data: Abdominal pain.  ABDOMEN - 1 VIEW  Comparison: 05/24/2012  Findings: Multiple gas filled, nondilated small bowel loops are seen mainly in the left abdomen.  There is also gas and moderate stool burden is seen throughout the entire colon.  No evidence of bowel obstruction. Multiple pelvic phleboliths are identified, but no definite radiopaque calculi visualized.  IMPRESSION: Nonspecific, nonobstructive bowel gas pattern.  Moderate stool burden noted.   Original Report Authenticated By: Danae Orleans, M.D.    Dg Abd 1 View  05/24/2012  *RADIOLOGY REPORT*  Clinical Data: Abdominal pain  ABDOMEN - 1 VIEW  Comparison: None.  Findings: Normal bowel gas pattern.  No  obstruction.  There are multiple phleboliths in the central lower abdomen and pelvis.  The abdominal and pelvic soft tissues are otherwise unremarkable.  The bones are demineralized.  There are degenerative changes of the lower lumbar spine.  The lung bases are clear.  IMPRESSION: No acute findings.  No evidence of obstruction.   Original Report Authenticated By: Domenic Moras, M.D.    Ct Head Wo Contrast  06/04/2012  *RADIOLOGY REPORT*  Clinical Data: Unresponsive  CT HEAD WITHOUT CONTRAST  Technique:  Contiguous axial images were obtained from the base of the skull through the vertex without contrast.  Comparison: 05/27/2012  Findings: The brain has a normal appearance without evidence for hemorrhage, infarction, hydrocephalus, or mass lesion.  There is no extra axial fluid collection.  The skull and paranasal sinuses are normal.  IMPRESSION: No acute intracranial abnormalities.   Original Report Authenticated By: Rosealee Albee, M.D.    Ct Head Wo Contrast  05/27/2012  *RADIOLOGY REPORT*  Clinical Data: Encephalopathy.  CT HEAD WITHOUT CONTRAST  Technique:  Contiguous axial images were obtained from the base of the skull through the vertex without contrast.  Comparison: CT head 02/16/2010.  Findings: No acute cortical infarct, hemorrhage, mass lesion is present.  The ventricles are of normal size.  No significant extra- axial fluid collection is present.  The paranasal sinuses and mastoid air cells are clear. Atherosclerotic calcifications are present within the cavernous carotid arteries.  IMPRESSION: Negative CT of the head.   Original Report Authenticated By: Jamesetta Orleans. MATTERN, M.D.    Portable Chest Xray In Am  06/05/2012  *RADIOLOGY REPORT*  Clinical Data: Post cardiac arrest  PORTABLE CHEST - 1 VIEW  Comparison: 06/04/2012; 05/26/2012; 11/01/2011  Findings: Grossly unchanged cardiac silhouette and mediastinal contours.  Stable position of endotracheal tube, overlying tracheal air column  with tip above the carina.  Interval placement of enteric tube terminating inferior to the left hemidiaphragm. Interval removal of external pacing pads.  Minimally improved aeration of the bilateral upper lobes.  Worsening left basilar heterogeneous / consolidative opacities.  No definite pleural effusion or pneumothorax.  Unchanged bones.  IMPRESSION: 1.  Stable positioning of endotracheal tube.  No pneumothorax. 2.  Interval placement of enteric tube with tip inside port terminating through the left hemidiaphragm. 3.  Worsening left basilar/retrocardiac opacities, possibly atelectasis.   Original Report Authenticated By: Waynard Reeds, M.D.    Dg Chest Port 1 View  06/04/2012  *RADIOLOGY REPORT*  Clinical Data: Respiratory failure.  Status post intubation.  PORTABLE CHEST - 1 VIEW  Comparison: Chest 05/26/2012.  Findings: Endotracheal tube is in place with the tip in the right mainstem bronchus.  Withdrawal of 2-3 cm is recommended. There  is new bilateral upper lobe airspace disease. Lungs are otherwise clear.  No pneumothorax.  Heart size normal.  IMPRESSION:  1.  Right mainstem bronchus intubation.  Recommend withdrawal of2-3 cm. Finding called to Dr. Oletta Lamas at the time of interpretation. 2.  Right worse than left upper lobe airspace disease could be due to atelectasis or pneumonia.   Original Report Authenticated By: Bernadene Bell. Maricela Curet, M.D.     Current Medications:     . albuterol  2.5 mg Nebulization Q6H  . antiseptic oral rinse  15 mL Mouth Rinse QID  . hydrocortisone sodium succinate  50 mg Intravenous Q8H  . insulin aspart  0-9 Units Subcutaneous Q4H  . ipratropium  0.5 mg Nebulization Q6H  . magnesium sulfate 1 - 4 g bolus IVPB  1 g Intravenous Once  . pantoprazole (PROTONIX) IV  40 mg Intravenous QHS  . piperacillin-tazobactam (ZOSYN)  IV  3.375 g Intravenous Q8H  . potassium chloride  10 mEq Intravenous Q1 Hr x 6  . DISCONTD: albuterol-ipratropium  6 puff Inhalation Q6H  . DISCONTD:  chlorhexidine  15 mL Mouth/Throat BID  . DISCONTD: feeding supplement (VITAL 1.5 CAL)  1,000 mL Per Tube Q24H  . DISCONTD: magnesium sulfate LVP 250-500 ml  1 g Intravenous Once    ASSESSMENT:  1. Asystole/PEA arrest s/p multiple resuscitations 2. NSTEMI - peak trop 2.05 3. VDRF secondary to #1 4. Acute/chronic renal insufficiency 5. Hypomagnesemia, hypocalcemia 6. Acute blood loss anemia 2/2 BRBPR s/p pRBC x 2 units 7. Abdominal pain, n/v, GIB supporting ischemic bowel 8. Seizures 9. Temporal arteritis  -- on chronic steroids 10/ DNR   DISCUSSION/PLAN:  Improving steadily. Now extubated and off pressors. Would continue supportive care for now. Etiology of PEA arrest is unclear. Suspect NSTEMI was demand ischemia and not primary event. Would not cath at this point. Follow creatinine.  Given dilated RV on echo would work up for PE.  Discussed with Dr. Marchelle Gearing, will start with LE dopplers.   Will continue to follow. Will need extensive rehab.  Marca Ancona 06/08/2012 7:52 AM

## 2012-06-09 LAB — BASIC METABOLIC PANEL
CO2: 23 mEq/L (ref 19–32)
Calcium: 9.1 mg/dL (ref 8.4–10.5)
Creatinine, Ser: 1.53 mg/dL — ABNORMAL HIGH (ref 0.50–1.10)

## 2012-06-09 LAB — GLUCOSE, CAPILLARY
Glucose-Capillary: 125 mg/dL — ABNORMAL HIGH (ref 70–99)
Glucose-Capillary: 118 mg/dL — ABNORMAL HIGH (ref 70–99)
Glucose-Capillary: 126 mg/dL — ABNORMAL HIGH (ref 70–99)
Glucose-Capillary: 150 mg/dL — ABNORMAL HIGH (ref 70–99)

## 2012-06-09 LAB — CBC
Hemoglobin: 14.9 g/dL (ref 12.0–15.0)
MCH: 32.3 pg (ref 26.0–34.0)
RBC: 4.62 MIL/uL (ref 3.87–5.11)
WBC: 10.3 10*3/uL (ref 4.0–10.5)

## 2012-06-09 LAB — HEPARIN LEVEL (UNFRACTIONATED): Heparin Unfractionated: 0.68 IU/mL (ref 0.30–0.70)

## 2012-06-09 LAB — PHOSPHORUS: Phosphorus: 2.7 mg/dL (ref 2.3–4.6)

## 2012-06-09 LAB — PROCALCITONIN: Procalcitonin: 0.6 ng/mL

## 2012-06-09 MED ORDER — ESCITALOPRAM OXALATE 5 MG PO TABS
2.5000 mg | ORAL_TABLET | Freq: Every day | ORAL | Status: DC
  Administered 2012-06-09 – 2012-06-13 (×4): 2.5 mg via ORAL
  Filled 2012-06-09 (×6): qty 1

## 2012-06-09 MED ORDER — PANTOPRAZOLE SODIUM 40 MG PO TBEC
40.0000 mg | DELAYED_RELEASE_TABLET | Freq: Every day | ORAL | Status: DC
  Administered 2012-06-09 – 2012-06-14 (×6): 40 mg via ORAL
  Filled 2012-06-09 (×7): qty 1

## 2012-06-09 MED ORDER — BRINZOLAMIDE 1 % OP SUSP
1.0000 [drp] | Freq: Three times a day (TID) | OPHTHALMIC | Status: DC
  Administered 2012-06-09 – 2012-06-14 (×16): 1 [drp] via OPHTHALMIC
  Filled 2012-06-09: qty 10

## 2012-06-09 MED ORDER — TIMOLOL MALEATE 0.5 % OP SOLN
1.0000 [drp] | Freq: Two times a day (BID) | OPHTHALMIC | Status: DC
  Administered 2012-06-09 – 2012-06-14 (×11): 1 [drp] via OPHTHALMIC
  Filled 2012-06-09: qty 5

## 2012-06-09 MED ORDER — PREDNISONE 10 MG PO TABS
10.0000 mg | ORAL_TABLET | Freq: Every day | ORAL | Status: DC
  Administered 2012-06-09 – 2012-06-14 (×6): 10 mg via ORAL
  Filled 2012-06-09 (×7): qty 1

## 2012-06-09 MED ORDER — BRIMONIDINE TARTRATE-TIMOLOL 0.2-0.5 % OP SOLN
1.0000 [drp] | Freq: Two times a day (BID) | OPHTHALMIC | Status: DC
  Filled 2012-06-09 (×17): qty 0.1

## 2012-06-09 MED ORDER — LEVETIRACETAM 250 MG PO TABS
250.0000 mg | ORAL_TABLET | Freq: Two times a day (BID) | ORAL | Status: DC
  Administered 2012-06-09 – 2012-06-14 (×11): 250 mg via ORAL
  Filled 2012-06-09 (×12): qty 1

## 2012-06-09 MED ORDER — BRIMONIDINE TARTRATE 0.2 % OP SOLN
1.0000 [drp] | Freq: Two times a day (BID) | OPHTHALMIC | Status: DC
  Administered 2012-06-09 – 2012-06-14 (×11): 1 [drp] via OPHTHALMIC
  Filled 2012-06-09: qty 5

## 2012-06-09 MED ORDER — INSULIN ASPART 100 UNIT/ML ~~LOC~~ SOLN
0.0000 [IU] | Freq: Three times a day (TID) | SUBCUTANEOUS | Status: DC
  Administered 2012-06-09 – 2012-06-10 (×2): 3 [IU] via SUBCUTANEOUS
  Administered 2012-06-10: 2 [IU] via SUBCUTANEOUS
  Administered 2012-06-11: 3 [IU] via SUBCUTANEOUS
  Administered 2012-06-11: 2 [IU] via SUBCUTANEOUS
  Administered 2012-06-12 – 2012-06-14 (×3): 3 [IU] via SUBCUTANEOUS

## 2012-06-09 MED ORDER — INSULIN ASPART 100 UNIT/ML ~~LOC~~ SOLN: 0.0000 [IU] | Freq: Every day | SUBCUTANEOUS | Status: DC

## 2012-06-09 MED ORDER — BRIMONIDINE TARTRATE-TIMOLOL 0.2-0.5 % OP SOLN: 1.0000 [drp] | Freq: Two times a day (BID) | OPHTHALMIC | Status: DC

## 2012-06-09 NOTE — Progress Notes (Signed)
ANTICOAGULATION CONSULT NOTE - Follow Up Consult  Pharmacy Consult for Heparin Indication: suspected PE  Allergies  Allergen Reactions  . Lactose Intolerance (Gi) Diarrhea and Nausea Only    Patient Measurements: Height: 5\' 2"  (157.5 cm) Weight: 138 lb 0.1 oz (62.6 kg) IBW/kg (Calculated) : 50.1  Heparin Dosing Weight: 62.6kg  Vital Signs: Temp: 98.7 F (37.1 C) (10/25 0800) Temp src: Oral (10/25 0800) BP: 148/88 mmHg (10/25 0800) Pulse Rate: 91  (10/25 0800)  Labs:  Basename 06/09/12 0800 06/09/12 0429 06/08/12 1327 06/08/12 0400 06/07/12 2335 06/07/12 1030 06/07/12 0450  HGB -- 14.9 -- 12.8 -- -- --  HCT -- 41.6 -- 36.9 37.1 -- --  PLT -- 90* -- 63* 64* -- --  APTT -- -- -- -- -- -- --  LABPROT -- -- -- 15.5* -- -- 16.9*  INR -- -- -- 1.25 -- -- 1.41  HEPARINUNFRC 0.68 -- -- -- -- -- --  CREATININE -- 1.53* 1.68* -- -- -- 1.86*  CKTOTAL -- -- -- -- -- 99 --  CKMB -- -- -- -- -- -- --  TROPONINI -- -- -- -- -- -- --    Estimated Creatinine Clearance: 25.9 ml/min (by C-G formula based on Cr of 1.53).   Medications:  Heparin @ 950 units/hr  Assessment: 81yof started on heparin gtt last night for suspected PE. Initial heparin level is therapeutic. LE dopplers negative for DVT and V/Q scan shows intermediate probability for PE. No bleeding reported per patient. Platelets very low (could be due to Zosyn) but trending up. CBC otherwise stable. Renal function improving.  Goal of Therapy:  Heparin level 0.3-0.7 units/ml Monitor platelets by anticoagulation protocol: Yes   Plan:  1) Continue heparin at 950 units/hr 2) 8h heparin level to confirm  Fredrik Rigger 06/09/2012,8:58 AM

## 2012-06-09 NOTE — Progress Notes (Signed)
Name: [name] MRN: [id number] DOB: 02/26/1931    LOS: 5 Date of admit. 06/04/2012 PCP is [name], MD   Referring Provider: [facility] Reason for Referral:  VDRF secondary cardiac arrest.   PULMONARY / CRITICAL CARE MEDICINE  HPI:  78 yo AAF with MetS, CKD - stage 3, arthritis nnos. MGUS,  and hx of temporal ateritis who was on the commode  06/04/2012,  vomited, and had an acute change in mental status(declined) and EMS found her marginally responsive. She then became asystole and required CPR and epinephrine injection. Pulse was returned but she remained vasopressor dependent. She was intubated and 3 port cvl placed(rt fem) per EDP. PCCM asked to assume her care 06-04-12 12:40pm.    Events Since Admission: 06/04/2012 11:33 AM > Post arrest. Admit 06/04/12 pm - Post admission PEA arrest in ICU. Made LCB 06/05/12: Sedated  On vent. Critically ill. Attempting to follow commands. ECHO  RV dilatation. LVEF normal 10/22: RASS -2 off sedation, following commands. Making urine. Not on pressors. NO DVT 06/07/12 - extubated 06/08/12 - repeat dopplers - negative DVT again  Current Status:   Vital Signs: Temp:  [98.1 F (36.7 C)-98.7 F (37.1 C)] 98.7 F (37.1 C) (10/25 0800) Pulse Rate:  [76-115] 91  (10/25 0800) Resp:  [18-28] 22  (10/25 0800) BP: (127-179)/(78-107) 148/88 mmHg (10/25 0800) SpO2:  [96 %-100 %] 100 % (10/25 0817) Weight:  [62.6 kg (138 lb 0.1 oz)] 62.6 kg (138 lb 0.1 oz) (10/25 0448)  Physical Examination: General:   Neuro:   HEENT:  Left pupil 6 cm and rt 3 and NR. No LAN Neck:  No JVD Cardiovascular:  HSIR IR Lungs:  Coarse rhonchi bilat Abdomen:  Soft , no bs Musculoskeletal:  intact Skin:  cool  Principal Problem:  *Cardiac arrest Active Problems:  Acute renal failure  Monoclonal gammopathy  Temporal arteritis  Respiratory failure, acute  Metabolic acidosis  Atrial fibrillation  NSTEMI (non-ST elevated myocardial infarction) Nm Pulmonary Perf And Vent  06/08/2012  *RADIOLOGY REPORT*  Clinical data:  Shortness of breath, renal failure.  Previous day's chest radiograph showed bibasilar atelectasis.  VENTILATION PERFUSION SCINTIGRAPHY  Findings: Ventilation imaging with 16. Xe-133 inhaled shows homogeneous distribution throughout both lungs with diffuse retention throughout the left lung on the washout images. Perfusion imaging with 3. Tc23m MAA IV shows small to moderate subsegmental defects in the lingula, lateral and posterior basal segments left lower lobe, posterior left upper lobe, and posterior right upper lobe.  IMPRESSION 1. Intermediate likelihood ratio for pulmonary embolism.   Original Report Authenticated By: Osa Craver, M.D.      ASSESSMENT AND PLAN  PULMONARY  Lab 06/06/12 0947 06/05/12 1142 06/04/12 1726 06/04/12 1721 06/04/12 1510 06/04/12 1303  PHART 7.462* 7.449 7.093* -- 7.085* 6.846*  PCO2ART 23.4* 31.0* 42.8 -- 40.0 61.7*  PO2ART 160.0* 290.0*  277.0* -- 94.5 77.0*  HCO3 16.5* 21.1 13.1* 13.3* 11.5* --  O2SAT 99.6 99.6 100.0 100.0 92.3 --   Ventilator Settings:     ETT:  10-20  A:  VDRF secondary to arrest, presumed cardiac or neuro, sounds vasovagal P:   - extubated 10/23 - good pulm hygiene  CARDIOVASCULAR  Lab 06/07/12 0947 06/06/12 2018 06/05/12 0311 06/04/12 2020 06/04/12 1629 06/04/12 1511 06/04/12 1244 06/04/12 1130  TROPONINI -- -- 1.75* 2.05* -- 1.82* -- <0.30  LATICACIDVEN 1.7 2.9* -- -- 2.0 -- -- --  PROBNP -- -- -- -- -- -- 123.7 --   ECG:  Afib, BBB, ant lat ischemia Lines:  10-20 rt fem cvl>> 10/25 10-20 lt fem aline>> ? date  A: Presumed cardiac event  ?etiology.  Bradycardia>>PEA>>asystole. Severe hypoxia, Acute pulm edema.  Hx of CAD.  Doppler US > negative for DVT x 2. Intermediate prob V/Q scan.   -  echo 06/05/12: Systolic function was normal. The estimated ejection fraction was in the range of 60% to 65%. Doppler parameters are consistent with abnormal left ventricular relaxation (grade 1 diastolic dysfunction). - Right ventricle: RV appears dilated. RV function appears depressed but cannot evaluate fully as not well seen in all views. - Tricuspid valve: Mild-moderate regurgitation. - Pulmonary arteries: PA peak pressure: 35mm Hg (S).  P:  - V/Q intermediate prob with ventilatory defects and perfusion defects on L. In absence of hypoxemia, likelihood of life-threatening PE is low in this setting. Would defer further workup as she would not tolerate CT-PA well due to renal status. Empiric anticoagulation carries its own significant risks - would defer; will stop heparin 10/25   RENAL  Lab 06/09/12 0429 06/08/12 1327 06/07/12 0450 06/05/12 0311 06/04/12 1724  NA 138 140 137 134* 138  K 3.7 3.1* -- -- --  CL 98 100 99 104 114*  CO2 23 27 24  18* --  BUN 35* 34* 41* 36* 37*  CREATININE 1.53* 1.68* 1.86* 1.45* 1.70*  CALCIUM 9.1 8.7 8.2* 7.2* --  MG 1.9 2.0 1.8 1.3* --  PHOS 2.7 2.6 3.2 4.7* --   Intake/Output      10/24 0701 - 10/25 0700 10/25 0701 - 10/26 0700   I.V. (mL/kg) 482.9 (7.7) 29.5 (0.5)   IV Piggyback 50    Total Intake(mL/kg) 532.9 (8.5) 29.5 (0.5)   Urine (mL/kg/hr) 1615 (1.1)    Total Output 1615    Net -1082.1 +29.5        Stool Occurrence 1 x     Foley: 10-20  A:  Renal Insuff, hyperkalemia  P:   -follow BMP  GASTROINTESTINAL No results found for this basename: AST:5,ALT:5,ALKPHOS:5,BILITOT:5,PROT:5,ALBUMIN:5 in the last 168 hours  A:  Nausea, vomiting at admit  P:   - start diet 10/25  HEMATOLOGIC  Lab 06/09/12 0429 06/08/12 0400 06/07/12 2335 06/07/12 0450 06/07/12 0002 06/05/12 0311 06/04/12 2345  HGB 14.9 12.8 12.9 -- 13.1 15.4* --  HCT 41.6 36.9 37.1 -- 36.7 42.7 --  PLT 90* 63* 64* -- 50* 38* --  INR -- 1.25 -- 1.41 -- -- 1.70*  APTT  -- -- -- -- -- -- 35   A:  Falling Hgb 10;20/13:  ?source, s/p 2 unit prbc 06/04/12 and heparin drip dc'ed Thrombocytopneia since 06/04/12   (she was  admitted 05/26/12 - 05/29/12 for encephalopathy and during this time Plat  Was 132 on 05/27/12, 172 on 10/9/1 ); HITT panel negative 10/22  P:  - Monitor without Heparin drip  -  Follow CBC   INFECTIOUS  Lab 06/09/12 0429 06/08/12 0400 06/07/12 2335 06/07/12 1030 06/07/12 0002 06/05/12 0311  WBC 10.3 12.0* 13.3* -- 13.6* 13.1*  PROCALCITON 0.60 1.29 -- 1.95 -- --   Cultures: None Results for orders placed during the hospital encounter of 06/04/12  MRSA PCR SCREENING     Status: Normal   Collection Time   06/04/12  7:40 PM      Component Value Range Status Comment   MRSA by PCR NEGATIVE  NEGATIVE Final     Lab 06/09/12 0429 06/08/12 0400 06/07/12 1030  PROCALCITON 0.60 1.29 1.95    Antibiotics: None Anti-infectives     Start     Dose/Rate Route Frequency Ordered Stop   06/04/12 1600  piperacillin-tazobactam (ZOSYN) IVPB 3.375 g       3.375 g 12.5 mL/hr over 240 Minutes Intravenous 3 times per day 06/04/12 1535            A:  Empiric abx for ? aspiration P:   - d/c zosyn and follow CXR, clinical status, Pct   ENDOCRINE  Lab 06/09/12 0801 06/09/12 0319 06/08/12 2338 06/08/12 1959 06/08/12 1620  GLUCAP 118* 125* 114* 128* 102*   A:  Presumed adrenal suppression from chronic steroids . On steroids d/t temporal arteritis P:   -stop hydrocort and change back to her home pred 10mg   NEUROLOGIC  A:  AMS post arrest, ? ICH or ? CVA. CT head 06/04/12 - nil acute P:   -restart home Keppra and lexapro  BEST PRACTICE / DISPOSITION Level of Care:  ICU >> to SDU 10/25 Primary Service:  PCCM Consultants:  Cards Code Status:  Partial NCB Diet:  Heart healthy, diabetic DVT Px:  SCD GI Px:  PPI Skin Integrity:  intact Social / Family:     Levy Pupa, MD, PhD 06/09/2012, 11:57 AM [facility] [phone] or if no answer [phone]

## 2012-06-09 NOTE — Progress Notes (Signed)
Patient Name: [name] Date of Encounter: 06/09/2012   78 y/o woman with h/o CAD (s/p previous stent), CRI, CVA, temporal arteritis admitted with confusion and abdominal pain. S/p PEA arrest with VDRF and a/c renal failure.  SUBJECTIVE:   Extubated today. Awake follows commands. Only mildly confused. No CP or SOB. She wants to eat.  ECHO: - Left ventricle: The cavity size was mildly reduced. Wall thickness was normal. Systolic function was normal. The estimated ejection fraction was in the range of 60% to 65%. Doppler parameters are consistent with abnormal left ventricular relaxation (grade 1 diastolic dysfunction). - Right ventricle: RV appears dilated. RV function appears depressed but cannot evaluate fully as not well seen in all views. - Tricuspid valve: Mild-moderate regurgitation. - Pulmonary arteries: PA peak pressure: 35mm Hg (S).     OBJECTIVE  Filed Vitals:   06/09/12 0600 06/09/12 0700 06/09/12 0800 06/09/12 0817  BP: 142/87 145/86 148/88   Pulse: 95 87 91   Temp:   98.7 F (37.1 C)   TempSrc:   Oral   Resp: 23 20 22    Height:      Weight:      SpO2: 99% 99% 100% 100%    Intake/Output Summary (Last 24 hours) at 06/09/12 0839 Last data filed at 06/09/12 0800  Gross per 24 hour  Intake  542.4 ml  Output   1615 ml  Net -1072.6 ml   Weight change: -4 lb 13.6 oz (-2.2 kg)  PHYSICAL EXAM  General: Elderly, awake Head: Normocephalic, atraumatic, sclera non-icteric, no xanthomas, nares are without discharge.  OP clear Neck: Supple without bruits or + R EJ catheter Lungs:  clear Heart: RRR no s3, s4, or murmurs. Abdomen: Soft, non-tender, non-distended, normoactive BS in all 4 quads.  Msk:  Strength and tone appears normal for age. Extremities: Cool extremities peripherally. No clubbing, cyanosis or edema. DP/PT/Radials 2+ and equal bilaterally. Neuro: moves all extremities  LABS:  Recent Labs  Basename 06/09/12 0429 06/08/12 0400   WBC  10.3 12.0*   HGB 14.9 12.8   HCT 41.6 36.9   MCV 90.0 91.6   PLT 90* 63*    Lab 06/09/12 0429 06/08/12 1327 06/07/12 1030 06/07/12 0450  NA 138 140 -- 137  K 3.7 3.1* -- 2.9*  CL 98 100 -- 99  CO2 23 27 -- 24  BUN 35* 34* -- 41*  CREATININE 1.53* 1.68* -- 1.86*  CALCIUM 9.1 8.7 -- 8.2*  PROT -- -- -- --  BILITOT -- -- -- --  ALKPHOS -- -- -- --  ALT -- -- -- --  AST -- -- -- --  AMYLASE -- -- 124* --  LIPASE -- -- 35 --  GLUCOSE 126* 96 -- 185*   Recent Labs  Basename 06/07/12 1030   CKTOTAL 99   CKMB --   CKMBINDEX --   TROPONINI --    TELE: sinus rhythm    Current Medications:     . albuterol  2.5 mg Nebulization Q6H  . antiseptic oral rinse  15 mL Mouth Rinse BID  . heparin  3,000 Units Intravenous Once  . hydrocortisone sodium succinate  50 mg Intravenous Q12H  . insulin aspart  0-9 Units Subcutaneous Q4H  . ipratropium  0.5 mg Nebulization Q6H  . pantoprazole (PROTONIX) IV  40 mg Intravenous QHS  . piperacillin-tazobactam (ZOSYN)  IV  3.375 g Intravenous Q8H  . potassium chloride  10 mEq Intravenous Q1 Hr x 4  . DISCONTD:  antiseptic oral rinse  15 mL Mouth Rinse QID  . DISCONTD: hydrocortisone sodium succinate  50 mg Intravenous Q8H    ASSESSMENT:  1. PEA arrest s/p multiple resuscitations, second arrest occurred in setting of GI bleeding/ acute blood loss, requiring PRBCs 2. NSTEMI - peak trop 2.05 likely demand event related to PEA arrest 3. VDRF secondary to #1 4. Acute/chronic renal insufficiency 5. Hypomagnesemia, hypocalcemia 6. Acute blood loss anemia 2/2 BRBPR s/p pRBC x 2 units 7. Abdominal pain, n/v, GIB supporting ischemic bowel 8. Seizures 9. Temporal arteritis  -- on chronic steroids 10/ DNR   DISCUSSION/PLAN:  Improving steadily. Now extubated and off pressors. Would continue supportive care for now. Etiology of PEA arrest is unclear. Suspect NSTEMI was demand ischemia and not primary event. Would not cath at this point.  Will  continue to follow. Will need extensive rehab.  Hillis Range 06/09/2012 8:39 AM

## 2012-06-10 DIAGNOSIS — E876 Hypokalemia: Secondary | ICD-10-CM

## 2012-06-10 DIAGNOSIS — N289 Disorder of kidney and ureter, unspecified: Secondary | ICD-10-CM

## 2012-06-10 LAB — PHOSPHORUS: Phosphorus: 2.1 mg/dL — ABNORMAL LOW (ref 2.3–4.6)

## 2012-06-10 LAB — BASIC METABOLIC PANEL
BUN: 36 mg/dL — ABNORMAL HIGH (ref 6–23)
CO2: 26 mEq/L (ref 19–32)
Glucose, Bld: 118 mg/dL — ABNORMAL HIGH (ref 70–99)
Potassium: 2.9 mEq/L — ABNORMAL LOW (ref 3.5–5.1)
Sodium: 140 mEq/L (ref 135–145)

## 2012-06-10 LAB — CBC
HCT: 39.8 % (ref 36.0–46.0)
Hemoglobin: 13.7 g/dL (ref 12.0–15.0)
MCV: 90.2 fL (ref 78.0–100.0)
WBC: 8.2 10*3/uL (ref 4.0–10.5)

## 2012-06-10 LAB — GLUCOSE, CAPILLARY
Glucose-Capillary: 101 mg/dL — ABNORMAL HIGH (ref 70–99)
Glucose-Capillary: 126 mg/dL — ABNORMAL HIGH (ref 70–99)
Glucose-Capillary: 171 mg/dL — ABNORMAL HIGH (ref 70–99)

## 2012-06-10 MED ORDER — TRAMADOL HCL 50 MG PO TABS
50.0000 mg | ORAL_TABLET | ORAL | Status: DC | PRN
  Administered 2012-06-10 – 2012-06-14 (×6): 50 mg via ORAL
  Filled 2012-06-10 (×6): qty 1

## 2012-06-10 MED ORDER — POTASSIUM CHLORIDE CRYS ER 20 MEQ PO TBCR
20.0000 meq | EXTENDED_RELEASE_TABLET | ORAL | Status: DC
  Filled 2012-06-10: qty 1

## 2012-06-10 MED ORDER — POTASSIUM CHLORIDE 20 MEQ/15ML (10%) PO LIQD
20.0000 meq | ORAL | Status: AC
  Administered 2012-06-10 (×2): 20 meq via ORAL
  Filled 2012-06-10 (×2): qty 15

## 2012-06-10 MED ORDER — POTASSIUM CHLORIDE 20 MEQ PO PACK
20.0000 meq | PACK | ORAL | Status: DC
  Filled 2012-06-10 (×2): qty 1

## 2012-06-10 MED ORDER — POTASSIUM CHLORIDE CRYS ER 20 MEQ PO TBCR: 40.0000 meq | EXTENDED_RELEASE_TABLET | Freq: Once | ORAL | Status: DC

## 2012-06-10 NOTE — Progress Notes (Signed)
PT Cancellation Note  Patient Details Name: [name] MRN: [id number] DOB: 1930-10-12   Cancelled Treatment:    Reason Eval/Treat Not Completed: Pain limiting ability to participate.  Will return tomorrow to attempt PT evaluation.   Vena Austria 06/10/2012, 4:22 PM [phone]

## 2012-06-10 NOTE — Progress Notes (Signed)
Name: [name] MRN: [id number] DOB: 1931-07-11    LOS: 6 Date of admit. 06/04/2012 PCP is [name], MD   Referring Provider: [facility] Reason for Referral:  VDRF secondary cardiac arrest.   PULMONARY / CRITICAL CARE MEDICINE  HPI:  78 yo AAF with MetS, CKD - stage 3, arthritis nnos. MGUS,  and hx of temporal ateritis who was on the commode  06/04/2012,  vomited, and had an acute change in mental status(declined) and EMS found her marginally responsive. She then became asystole and required CPR and epinephrine injection. Pulse was returned but she remained vasopressor dependent. She was intubated and 3 port cvl placed(rt fem) per EDP. PCCM asked to assume her care 06-04-12 12:40pm.    Events Since Admission: 06/04/2012 11:33 AM > Post arrest. Admit 06/04/12 pm - Post admission PEA arrest in ICU. Made LCB 06/05/12: Sedated  On vent. Critically ill. Attempting to follow commands. ECHO  RV dilatation. LVEF normal 10/22: RASS -2 off sedation, following commands. Making urine. Not on pressors. NO DVT 06/07/12 - extubated 06/08/12 - repeat dopplers - negative DVT again  Current Status: On 3 west, c/o gen chest tight bilat with deep insp   Vital Signs: Temp:  [97.8 F (36.6 C)-98 F (36.7 C)] 98 F (36.7 C) (10/26 0500) Pulse Rate:  [76-107] 84  (10/26 0904) Resp:  [18-30] 18  (10/26 0904) BP: (138-175)/(79-99) 144/79 mmHg (10/26 0555) SpO2:  [92 %-99 %] 94 % (10/26 0904) FIO2  1lpm  Physical Examination: General:  Frail, chronically > acutely ill :   HEENT:  Left pupil 6 cm and rt 3 and NR. No LAN Neck:  No JVD Cardiovascular:  HSIR IR Lungs:  Coarse rhonchi bilat Abdomen:  Soft , no bs Musculoskeletal:  intact Skin:  warm  Principal Problem:  *Cardiac arrest Active Problems:  Acute renal failure  Temporal  arteritis  Respiratory failure, acute  Atrial fibrillation  NSTEMI (non-ST elevated myocardial infarction)  Hypokalemia Nm Pulmonary Perf And Vent  06/08/2012  *RADIOLOGY REPORT*  Clinical data:  Shortness of breath, renal failure.  Previous day's chest radiograph showed bibasilar atelectasis.  VENTILATION PERFUSION SCINTIGRAPHY  Findings: Ventilation imaging with 16. Xe-133 inhaled shows homogeneous distribution throughout both lungs with diffuse retention throughout the left lung on the washout images. Perfusion imaging with 3. Tc58m MAA IV shows small to moderate subsegmental defects in the lingula, lateral and posterior basal segments left lower lobe, posterior left upper lobe, and posterior right upper lobe.  IMPRESSION 1. Intermediate likelihood ratio for pulmonary embolism.   Original Report Authenticated By: Osa Craver, M.D.      ASSESSMENT AND PLAN  PULMONARY  Lab 06/06/12 0947 06/05/12 1142 06/04/12 1726 06/04/12 1721 06/04/12 1510 06/04/12 1303  PHART 7.462* 7.449 7.093* -- 7.085* 6.846*  PCO2ART 23.4* 31.0* 42.8 -- 40.0 61.7*  PO2ART 160.0* 290.0* 277.0* --  94.5 77.0*  HCO3 16.5* 21.1 13.1* 13.3* 11.5* --  O2SAT 99.6 99.6 100.0 100.0 92.3 --      ETT:  10-20 > 10/23  A:  VDRF secondary to arrest, presumed cardiac or neuro, ? Vasovagal/ resolved      Still 02 dep       CARDIOVASCULAR  Lab 06/07/12 0947 06/06/12 2018 06/05/12 0311 06/04/12 2020 06/04/12 1629 06/04/12 1511 06/04/12 1244 06/04/12 1130  TROPONINI -- -- 1.75* 2.05* -- 1.82* -- <0.30  LATICACIDVEN 1.7 2.9* -- -- 2.0 -- -- --  PROBNP -- -- -- -- -- -- 123.7 --   ECG:  Afib, BBB, ant lat ischemia Lines:  10-20 rt fem cvl>> 10/25 10-20 lt fem aline>> ? date  A: Presumed cardiac event  ?etiology.  Bradycardia>>PEA>>asystole. Severe hypoxia, Acute pulm edema.  Hx of CAD.  Doppler US > negative for DVT x 2. Intermediate prob V/Q scan.   - echo 06/05/12: Systolic function was normal.  The estimated ejection fraction was in the range of 60% to 65%. Doppler parameters are consistent with abnormal left ventricular relaxation (grade 1 diastolic dysfunction). - Right ventricle: RV appears dilated. RV function appears depressed but cannot evaluate fully as not well seen in all views. - Tricuspid valve: Mild-moderate regurgitation. - Pulmonary arteries: PA peak pressure: 35mm Hg (S).  P:  - V/Q intermediate prob with ventilatory defects and perfusion defects on L. In absence of hypoxemia, likelihood of life-threatening PE is low in this setting. Would defer further workup as she would not tolerate CT-PA well due to renal status. Empiric anticoagulation carries its own significant risks - would defer;  stopped heparin 10/25   RENAL  Lab 06/10/12 0530 06/09/12 0429 06/08/12 1327 06/07/12 0450 06/05/12 0311  NA 140 138 140 137 134*  K 2.9* 3.7 -- -- --  CL 102 98 100 99 104  CO2 26 23 27 24  18*  BUN 36* 35* 34* 41* 36*  CREATININE 1.41* 1.53* 1.68* 1.86* 1.45*  CALCIUM 8.9 9.1 8.7 8.2* 7.2*  MG 1.7 1.9 2.0 1.8 1.3*  PHOS 2.1* 2.7 2.6 3.2 4.7*   Intake/Output      10/25 0701 - 10/26 0700 10/26 0701 - 10/27 0700   P.O. 240    I.V. (mL/kg) 158 (2.5)    IV Piggyback     Total Intake(mL/kg) 398 (6.4)    Urine (mL/kg/hr) 450 (0.3)    Total Output 450    Net -52         Urine Occurrence 2 x    Stool Occurrence 1 x     Foley: 10-20  A:  Renal Insuff,   > resolving       Hypokalemi 10/26 > rx  P:   -follow BMP  GASTROINTESTINAL No results found for this basename: AST:5,ALT:5,ALKPHOS:5,BILITOT:5,PROT:5,ALBUMIN:5 in the last 168 hours  A:  Nausea, vomiting at admit  P:   - started diet 10/25  HEMATOLOGIC  Lab 06/10/12 0530 06/09/12 0429 06/08/12 0400 06/07/12 2335 06/07/12 0450 06/07/12 0002 06/04/12 2345  HGB 13.7 14.9 12.8 12.9 -- 13.1 --  HCT 39.8 41.6 36.9 37.1 -- 36.7 --  PLT 99* 90* 63* 64* -- 50* --  INR -- -- 1.25 -- 1.41 -- 1.70*  APTT -- -- --  -- -- -- 35   A:  Falling Hgb 06/04/12:  ?source, s/p 2 unit prbc 06/04/12 and heparin drip dc'ed Thrombocytopneia since 06/04/12   (she was  admitted 05/26/12 - 05/29/12 for encephalopathy and during  this time Plat  Was 132 on 05/27/12, 172 on 10/9/1 ); HITT panel negative 10/22  P:  - Monitor without Heparin drip  - Follow CBC   INFECTIOUS  Lab 06/10/12 0530 06/09/12 0429 06/08/12 0400 06/07/12 2335 06/07/12 1030 06/07/12 0002  WBC 8.2 10.3 12.0* 13.3* -- 13.6*  PROCALCITON 0.39 0.60 1.29 -- 1.95 --   Cultures: None Results for orders placed during the hospital encounter of 06/04/12  MRSA PCR SCREENING     Status: Normal   Collection Time   06/04/12  7:40 PM      Component Value Range Status Comment   MRSA by PCR NEGATIVE  NEGATIVE Final     Lab 06/10/12 0530 06/09/12 0429 06/08/12 0400 06/07/12 1030  PROCALCITON 0.39 0.60 1.29 1.95    Antibiotics: None Anti-infectives     Start     Dose/Rate Route Frequency Ordered Stop   06/04/12 1600   piperacillin-tazobactam (ZOSYN) IVPB 3.375 g  Status:  Discontinued        3.375 g 12.5 mL/hr over 240 Minutes Intravenous 3 times per day 06/04/12 1535 06/09/12 1151          A:  Empiric abx for ? aspiration P:   - d/c'd zosyn  10/25 >  follow CXR, clinical status, Pct   ENDOCRINE  Lab 06/10/12 0754 06/09/12 2129 06/09/12 1630 06/09/12 1204 06/09/12 0801  GLUCAP 101* 150* 168* 126* 118*   A:  Presumed adrenal suppression from chronic steroids . On steroids d/t temporal arteritis P:   -stopped hydrocort and change back to her home pred 10mg  10/25  NEUROLOGIC  A:  AMS post arrest, ? ICH or ? CVA. CT head 06/04/12 - no acute changes P:   -restart home Keppra and lexapro  BEST PRACTICE / DISPOSITION Level of Care:  ICU >> to floor 10/26 Primary Service:  PCCM Consultants:  Cards Code Status:  Partial NCB Diet:  Heart healthy, diabetic DVT Px:  SCD GI Px:  PPI Skin Integrity:  intact Social / Family:      Sandrea Hughs, MD Pulmonary and Critical Care Medicine [facility] Cell [phone]

## 2012-06-10 NOTE — Progress Notes (Signed)
   CARE MANAGEMENT NOTE 06/10/2012  Patient:  [name], [name]   Account Number:  [id number]  Date Initiated:  06/05/2012  Documentation initiated by:  Junius Creamer  Subjective/Objective Assessment:   adm w cardiac arrest     Action/Plan:   lives w children, pcp dr Concepcion Elk, act w [facility]  Verda Cumins [place], # [phone]   Anticipated DC Date:     Anticipated DC Plan:  HOME W HOME HEALTH SERVICES      DC Planning Services  CM consult      [facility] Choice  Resumption Of Svcs/PTA Provider   Choice offered to / List presented to:  C-4 Adult Children        HH arranged  HH-1 RN      [facility] agency  [facility]   Status of service:  Completed, signed off Medicare Important Message given?   (If response is "NO", the following Medicare IM given date fields will be blank) Date Medicare IM given:   Date Additional Medicare IM given:    Discharge Disposition:  HOME W HOME HEALTH SERVICES  Per UR Regulation:  Reviewed for med. necessity/level of care/duration of stay  If discussed at Long Length of Stay Meetings, dates discussed:    Comments:  06/10/2012 1530 NCM spoke to pt and gave person permission to speak to son, [name], # [phone]. States [facility] RN came out once. Pt is a readmitted from 10/14. Son states he wants to see if his mom can have SNF-rehab or IP rehab. NCM left note for MD for PT/OT evaluation. Son states his mother is allergic to milk and she had milk on her tray. NCM did follow up with pt's Unit RN. Did follow up with [facility] and they are following pt for [facility]. Isidoro Donning RN CCM Case Mgmt phone [phone]  06/10/2012 1330 NCM contacted Genevieve Norlander to make aware that pt has [employer] for insurance. Will follow up with [facility]. Isidoro Donning RN CCM Case Mgmt phone [phone]  10/24 14:33p debbie dowell rn,bsn received call from [facility] that they are act w pt. [facility] rep will follow and resume care after disch.  10/21 13:35p debbie dowell  rn,bsn [phone]
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
## 2012-06-11 DIAGNOSIS — R0602 Shortness of breath: Secondary | ICD-10-CM

## 2012-06-11 LAB — BASIC METABOLIC PANEL
CO2: 22 mEq/L (ref 19–32)
Chloride: 102 mEq/L (ref 96–112)
Glucose, Bld: 105 mg/dL — ABNORMAL HIGH (ref 70–99)
Potassium: 4.6 mEq/L (ref 3.5–5.1)
Sodium: 137 mEq/L (ref 135–145)

## 2012-06-11 MED ORDER — SPIRONOLACTONE 12.5 MG HALF TABLET
12.5000 mg | ORAL_TABLET | Freq: Every day | ORAL | Status: DC
  Administered 2012-06-11 – 2012-06-13 (×3): 12.5 mg via ORAL
  Filled 2012-06-11 (×3): qty 1

## 2012-06-11 MED ORDER — BISOPROLOL FUMARATE 5 MG PO TABS
5.0000 mg | ORAL_TABLET | Freq: Every day | ORAL | Status: DC
  Administered 2012-06-11 – 2012-06-14 (×4): 5 mg via ORAL
  Filled 2012-06-11 (×4): qty 1

## 2012-06-11 MED ORDER — ARFORMOTEROL TARTRATE 15 MCG/2ML IN NEBU
15.0000 ug | INHALATION_SOLUTION | Freq: Two times a day (BID) | RESPIRATORY_TRACT | Status: DC
  Administered 2012-06-11 – 2012-06-14 (×6): 15 ug via RESPIRATORY_TRACT
  Filled 2012-06-11 (×9): qty 2

## 2012-06-11 MED ORDER — BUDESONIDE 0.5 MG/2ML IN SUSP
0.5000 mg | Freq: Two times a day (BID) | RESPIRATORY_TRACT | Status: DC
  Administered 2012-06-11 – 2012-06-14 (×5): 0.5 mg via RESPIRATORY_TRACT
  Filled 2012-06-11 (×11): qty 2

## 2012-06-11 MED ORDER — METOCLOPRAMIDE HCL 5 MG PO TABS
5.0000 mg | ORAL_TABLET | Freq: Three times a day (TID) | ORAL | Status: DC
  Administered 2012-06-11 – 2012-06-13 (×8): 5 mg via ORAL
  Filled 2012-06-11 (×12): qty 1

## 2012-06-11 MED ORDER — ATENOLOL 25 MG PO TABS
25.0000 mg | ORAL_TABLET | Freq: Every day | ORAL | Status: DC
  Filled 2012-06-11: qty 1

## 2012-06-11 MED ORDER — POTASSIUM CHLORIDE 20 MEQ/15ML (10%) PO LIQD
20.0000 meq | ORAL | Status: AC
  Administered 2012-06-11 (×2): 20 meq via ORAL
  Filled 2012-06-11 (×2): qty 15

## 2012-06-11 NOTE — Progress Notes (Signed)
Patient Name: [name]      SUBJECTIVE: Admitted with a PEA arrest multiple resuscitations complicated by GI bleeding, DVT  , acute and chronic renal insufficiency and possible ischemic bowel;  She has MetS  And she has a history of ischemic heart disease with prior stenting and positive troponins thought to be secondary to demand. She was on chronic steroids.  SHe was extubated 10/23. Echocardiogram had demonstrated normal left ventricular function with some RV dilatation and dysfunction with moderate TR; venous Dopplers were negative. VQ scan was intermediate probability felt by pulmonary/CCM to be at low risk for life-threatening PE  abx d/c yday  Intermittent cough; stable chronic chest pain     Past Medical History  Diagnosis Date  . Arthritis   . Hypertension   . Coronary artery disease     a. Neg stress test 2008. b. ?questionable history - pt thought she had heart cath in 1970's but details were unclear.  . Stroke     a. Details unclear.  . Seizures     a. Due to hyponatremia, resolved.  Marland Kitchen History of gout   . Glaucoma   . Cataracts, bilateral   . Acute kidney injury   . CKD (chronic kidney disease), stage III   . Hyperlipidemia   . Gallstones   . Cerebrovascular disease   . Hyponatremia     a. H/o 2011 ?SIADH with seizures, encephalopathy at that time.  . Temporal arteritis     PHYSICAL EXAM Filed Vitals:   06/10/12 2100 06/11/12 0143 06/11/12 0500 06/11/12 0810  BP: 164/108  158/100   Pulse: 88  87   Temp: 97.5 F (36.4 C)  97.6 F (36.4 C)   TempSrc:      Resp: 16  16   Height:      Weight:   138 lb (62.596 kg)   SpO2: 97% 94% 97% 98%    General appearance: alert and cooperative Neck: JVD - 8 cm above sternal notch Lungs: Clear on the left and crackles on the right dfffuse wheezes Heart: regular rate and rhythm, S1, S2 normal, no murmur, click, rub or gallop Abdomen: soft, non-tender; bowel sounds normal; no masses,  no  organomegaly Extremities: extremities normal, atraumatic, no cyanosis or edema Skin: Skin color, texture, turgor normal. 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 oriented to place and name  TELEMETRY: Reviewed telemetry pt in  nsr   Intake/Output Summary (Last 24 hours) at 06/11/12 0849 Last data filed at 06/10/12 1300  Gross per 24 hour  Intake      0 ml  Output      0 ml  Net      0 ml    LABS: Basic Metabolic Panel:  Lab 06/10/12 1610 06/09/12 0429 06/08/12 1327 06/07/12 0450 06/05/12 0311 06/04/12 1724 06/04/12 1307  NA 140 138 140 137 134* 138 132*  K 2.9* 3.7 3.1* 2.9* 3.8 5.4* 5.6*  CL 102 98 100 99 104 114* 113*  CO2 26 23 27 24  18* -- --  GLUCOSE 118* 126* 96 185* 322* 282* 256*  BUN 36* 35* 34* 41* 36* 37* 35*  CREATININE 1.41* 1.53* 1.68* 1.86* 1.45* 1.70* 1.70*  CALCIUM 8.9 9.1 -- -- -- -- --  MG 1.7 1.9 -- -- -- -- --  PHOS 2.1* 2.7 -- -- -- -- --   Cardiac Enzymes: No results found for this basename: CKTOTAL:3,CKMB:3,CKMBINDEX:3,TROPONINI:3 in the last 72  hours CBC:  Lab 06/10/12 0530 06/09/12 0429 06/08/12 0400 06/07/12 2335 06/07/12 0002 06/05/12 0311 06/04/12 1724 06/04/12 1541 06/04/12 1100  WBC 8.2 10.3 12.0* 13.3* 13.6* 13.1* -- 13.8* --  NEUTROABS -- -- -- -- -- -- -- -- 4.0  HGB 13.7 14.9 12.8 12.9 13.1 15.4* 11.9* -- --  HCT 39.8 41.6 36.9 37.1 36.7 42.7 35.0* -- --  MCV 90.2 90.0 91.6 90.0 88.4 88.6 -- 96.9 --  PLT 99* 90* 63* 64* 50* 38* -- 61* --   PROTIME: No results found for this basename: LABPROT:3,INR:3 in the last 72 hours Liver Function Tests: No results found for this basename: AST:2,ALT:2,ALKPHOS:2,BILITOT:2,PROT:2,ALBUMIN:2 in the last 72 hours No results found for this basename: LIPASE:2,AMYLASE:2 in the last 72 hours BNP: BNP (last 3 results)  Basename 06/04/12 1244  PROBNP 123.7     ASSESSMENT AND PLAN:  Patient Active Hospital Problem List: Cardiac arrest (06/04/2012)     Anemia   hypokalemia    Atrial fibrillation (06/04/2012)  * NSTEMI (non-ST elevated myocardial infarction) (06/07/2012)    Continue to replete K Add aldactone and low dose beta blocker for hypertension; the latter not ideal, but in setting of possible NonSTEMI-- will do lexiscan myoview in am Signed, Sherryl Manges MD  06/11/2012

## 2012-06-11 NOTE — Evaluation (Signed)
Physical Therapy Evaluation Patient Details Name: [name] MRN: [id number] DOB: 02-May-1931 Today's Date: 06/11/2012 Time: 4098-1191 PT Time Calculation (min): 21 min  PT Assessment / Plan / Recommendation Clinical Impression  Patient is a 78 yo female admitted following cardiac arrest, with VDRF.  Patient limited by weakness, pain, and decreased activity tolerance.  Patient will benefit from acute PT to maximize independence.  Recommend SNF at discharge for continued therapy prior to return home.    PT Assessment  Patient needs continued PT services    Follow Up Recommendations  Post acute inpatient (SNF)    Does the patient have the potential to tolerate intense rehabilitation   No, Recommend SNF  Barriers to Discharge        Equipment Recommendations  None recommended by PT    Recommendations for Other Services     Frequency Min 3X/week    Precautions / Restrictions Precautions Precautions: Fall Restrictions Weight Bearing Restrictions: No   Pertinent Vitals/Pain Pain limiting mobility.      Mobility  Bed Mobility Bed Mobility: Rolling Left;Left Sidelying to Sit;Sitting - Scoot to Edge of Bed Rolling Left: 4: Min assist;With rail Left Sidelying to Sit: With rails;3: Mod assist;HOB flat Sitting - Scoot to Edge of Bed: 3: Mod assist Details for Bed Mobility Assistance: Verbal cues for technique.  Increased time for mobility.  Assist to move trunk off bed. Transfers Transfers: Editor, commissioning Transfers: 2: Max assist Details for Transfer Assistance: Verbal cues for technique.  Needed step-by-step instructions.  Assist to initiate standing, and to maintain balance during transfer. Ambulation/Gait Ambulation/Gait Assistance: Not tested (comment)           PT Diagnosis: Difficulty walking;Generalized weakness;Acute pain;Altered mental status  PT Problem List: Decreased strength;Decreased activity tolerance;Decreased mobility;Pain;Decreased  balance;Decreased cognition;Decreased safety awareness PT Treatment Interventions: DME instruction;Gait training;Functional mobility training;Therapeutic activities;Therapeutic exercise;Balance training;Cognitive remediation;Patient/family education   PT Goals Acute Rehab PT Goals PT Goal Formulation: With patient Time For Goal Achievement: 06/25/12 Potential to Achieve Goals: Good Pt will go Supine/Side to Sit: with modified independence PT Goal: Supine/Side to Sit - Progress: Goal set today Pt will go Sit to Supine/Side: with modified independence PT Goal: Sit to Supine/Side - Progress: Goal set today Pt will go Sit to Stand: with supervision;with upper extremity assist PT Goal: Sit to Stand - Progress: Goal set today Pt will Transfer Bed to Chair/Chair to Bed: with supervision PT Transfer Goal: Bed to Chair/Chair to Bed - Progress: Goal set today Pt will Ambulate: 16 - 50 feet;with supervision;with rolling walker PT Goal: Ambulate - Progress: Goal set today  Visit Information  Last PT Received On: 06/11/12 Assistance Needed: +1    Subjective Data  Subjective: "My chest hurts under my breasts" - RN aware Patient Stated Goal: None stated   Prior Functioning  Home Living Lives With: Alone Available Help at Discharge: Family;Available PRN/intermittently (Son lives nearby; daughter stays at night sometimes) Type of Home: Apartment Home Access: Ramped entrance Home Layout: One level Bathroom Toilet: Standard Home Adaptive Equipment: Bedside commode/3-in-1;Shower chair with back;Straight cane;Walker - rolling;Wheelchair - manual Prior Function Level of Independence: Independent with assistive device(s) (using cane) Able to Take Stairs?: No Driving: No Vocation: Retired Musician: [employer] (Decreased hearing left ear; "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) Dominant Hand: Right    Cognition  Overall Cognitive Status: Impaired Area of Impairment: Safety/judgement;Awareness of  deficits Arousal/Alertness: Lethargic Orientation Level: Oriented X4 / Intact Behavior During Session: Flat affect Safety/Judgement: Decreased awareness of need  for assistance    Extremity/Trunk Assessment Right Upper Extremity Assessment RUE ROM/Strength/Tone: [facility] for tasks assessed Left Upper Extremity Assessment LUE ROM/Strength/Tone: WFL for tasks assessed Right Lower Extremity Assessment RLE ROM/Strength/Tone: Deficits RLE ROM/Strength/Tone Deficits: Strength 4-/5 RLE Sensation: WFL - Light Touch Left Lower Extremity Assessment LLE ROM/Strength/Tone: Deficits LLE ROM/Strength/Tone Deficits: Strength 4-/5 LLE Sensation: WFL - Light Touch   Balance Balance Balance Assessed: Yes Static Sitting Balance Static Sitting - Balance Support: Right upper extremity supported;Left upper extremity supported;Feet unsupported Static Sitting - Level of Assistance: 4: Min assist Static Sitting - Comment/# of Minutes: Patient able to sit on EOB x 4 minutes, requiring min assist to maintain balance. Patient leaning posteriorly. Static Standing Balance Static Standing - Balance Support: Right upper extremity supported;Left upper extremity supported Static Standing - Level of Assistance: 3: Mod assist Static Standing - Comment/# of Minutes: Patient requiring mod assist to maintain standing balance and upright posture - patient leaning posteriorly.  End of Session PT - End of Session Equipment Utilized During Treatment: Gait belt;Oxygen Activity Tolerance: Patient limited by fatigue;Patient limited by pain Patient left: in chair;with call bell/phone within reach Nurse Communication: Mobility status  GP     Vena Austria 06/11/2012, 1:48 PM Durenda Hurt. Renaldo Fiddler, [facility] Acute Rehab Services Pager [phone]

## 2012-06-11 NOTE — Progress Notes (Signed)
Name: [name] MRN: [id number] DOB: [birth year]    LOS: 7 Date of admit. 06/04/2012 PCP is [name], MD   Referring Provider: [facility] Reason for Referral:  VDRF secondary cardiac arrest.   PULMONARY / CRITICAL CARE MEDICINE  HPI:  78 yo AAF with MetS, CKD - stage 3, arthritis nnos. MGUS,  and hx of temporal ateritis who was on the commode  06/04/2012,  vomited, and had an acute change in mental status(declined) and EMS found her marginally responsive. She then became asystole and required CPR and epinephrine injection. Pulse was returned but she remained vasopressor dependent. She was intubated and 3 port cvl placed(rt fem) per EDP. PCCM asked to assume her care 06-04-12 12:40pm.    Events Since Admission: 06/04/2012 11:33 AM > Post arrest. Admit 06/04/12 pm - Post admission PEA arrest in ICU. Made LCB 06/05/12: Sedated  On vent. Critically ill. Attempting to follow commands. ECHO  RV dilatation. LVEF normal 10/22: RASS -2 off sedation, following commands. Making urine. Not on pressors. NO DVT 06/07/12 - extubated 06/08/12 - repeat dopplers - negative DVT again  Current Status: On 3 west, c/o gen chest tight bilat with deep insp- present for years, better p nebs   Vital Signs: Temp:  [97.3 F (36.3 C)-97.6 F (36.4 C)] 97.6 F (36.4 C) (10/27 0500) Pulse Rate:  [77-88] 87  (10/27 0500) Resp:  [16-20] 16  (10/27 0500) BP: (158-171)/(95-108) 158/100 mmHg (10/27 0500) SpO2:  [94 %-98 %] 98 % (10/27 0810) Weight:  [138 lb (62.596 kg)] 138 lb (62.596 kg) (10/27 0500) FIO2  1 - 2lpm  Physical Examination: General:  Frail, chronically > acutely ill :   HEENT:  Left pupil 6 cm and rt 3 and NR. No LAN Neck:  No JVD Cardiovascular:  HSIR IR Lungs:  Coarse rhonchi bilat Abdomen:  Soft , no bs Musculoskeletal:   intact Skin:  warm  Principal Problem:  *Cardiac arrest Active Problems:  Acute renal failure  Temporal arteritis  Respiratory failure, acute  Atrial fibrillation  NSTEMI (non-ST elevated myocardial infarction)  Hypokalemia No results found.   ASSESSMENT AND PLAN  PULMONARY  Lab 06/06/12 0947 06/05/12 1142 06/04/12 1726 06/04/12 1721 06/04/12 1510 06/04/12 1303  PHART 7.462* 7.449 7.093* -- 7.085* 6.846*  PCO2ART 23.4* 31.0* 42.8 -- 40.0 61.7*  PO2ART 160.0* 290.0* 277.0* -- 94.5 77.0*  HCO3 16.5* 21.1 13.1* 13.3* 11.5* --  O2SAT 99.6 99.6 100.0 100.0 92.3 --      ETT:  10-20 > 10/23  A:  VDRF secondary to arrest, presumed cardiac or neuro, ? Vasovagal/ resolved      Still 02 dep        History of chronic chest tightness atypical cp c/w asthma or gerd - wean 02 as tol - Change nebs to bud/formoterol and change tenormin to bisoprolol (ideally shouldn't be on timolol  eye drops either - Change reglan to ac and hs short term only      CARDIOVASCULAR  Lab 06/07/12 0947 06/06/12 2018 06/05/12 0311 06/04/12 2020 06/04/12 1629 06/04/12 1511 06/04/12 1244 06/04/12 1130  TROPONINI -- -- 1.75* 2.05* -- 1.82* -- <0.30  LATICACIDVEN 1.7 2.9* -- -- 2.0 -- -- --  PROBNP -- -- -- -- -- -- 123.7 --   ECG:  Afib, BBB, ant lat ischemia Lines:  10-20 rt fem cvl>> 10/25 10-20 lt fem aline>> ? date  A: Presumed cardiac event  ?etiology.  Bradycardia>>PEA>>asystole. Severe hypoxia, Acute pulm edema.  Hx of CAD.  Doppler US > negative for DVT x 2. Intermediate prob V/Q scan.   - echo 06/05/12: Systolic function was normal. The estimated ejection fraction was in the range of 60% to 65%. Doppler parameters are consistent with abnormal left ventricular relaxation (grade 1 diastolic dysfunction). - Right ventricle: RV appears dilated. RV function appears depressed but cannot evaluate fully as not well seen in all views. - Tricuspid valve: Mild-moderate regurgitation. - Pulmonary  arteries: PA peak pressure: 35mm Hg (S).  P:  - V/Q intermediate prob with ventilatory defects and perfusion defects on L. In absence of hypoxemia, likelihood of life-threatening PE is low in this setting. Would defer further workup as she would not tolerate CT-PA well due to renal status. Empiric anticoagulation carries its own significant risks - would defer;  stopped heparin 10/25 W/u cp per Cards   RENAL  Lab 06/10/12 0530 06/09/12 0429 06/08/12 1327 06/07/12 0450 06/05/12 0311  NA 140 138 140 137 134*  K 2.9* 3.7 -- -- --  CL 102 98 100 99 104  CO2 26 23 27 24  18*  BUN 36* 35* 34* 41* 36*  CREATININE 1.41* 1.53* 1.68* 1.86* 1.45*  CALCIUM 8.9 9.1 8.7 8.2* 7.2*  MG 1.7 1.9 2.0 1.8 1.3*  PHOS 2.1* 2.7 2.6 3.2 4.7*   Intake/Output      10/26 0701 - 10/27 0700 10/27 0701 - 10/28 0700   P.O. 237    I.V. (mL/kg)     Total Intake(mL/kg) 237 (3.8)    Urine (mL/kg/hr)     Total Output     Net +237         Urine Occurrence 3 x    Stool Occurrence 1 x     Foley: 10-20  A:  Renal Insuff,   > resolving       Hypokalemi 10/26 > rx  P:   -follow BMP  GASTROINTESTINAL No results found for this basename: AST:5,ALT:5,ALKPHOS:5,BILITOT:5,PROT:5,ALBUMIN:5 in the last 168 hours  A:  Nausea, vomiting at admit  P:   - started diet 10/25  HEMATOLOGIC  Lab 06/10/12 0530 06/09/12 0429 06/08/12 0400 06/07/12 2335 06/07/12 0450 06/07/12 0002 06/04/12 2345  HGB 13.7 14.9 12.8 12.9 -- 13.1 --  HCT 39.8 41.6 36.9 37.1 -- 36.7 --  PLT 99* 90* 63* 64* -- 50* --  INR -- -- 1.25 -- 1.41 -- 1.70*  APTT -- -- -- -- -- -- 35   A:  Falling Hgb 06/04/12:  ?source, s/p 2 unit prbc 06/04/12 and heparin drip dc'ed Thrombocytopneia since 06/04/12   (she was  admitted 05/26/12 - 05/29/12 for encephalopathy and during this time Plat  Was 132 on 05/27/12, 172 on 10/9/1 ); HITT panel negative 10/22  P:  - Monitor without Heparin drip  - Follow CBC   INFECTIOUS  Lab 06/10/12 0530 06/09/12 0429  06/08/12 0400 06/07/12 2335 06/07/12  1030 06/07/12 0002  WBC 8.2 10.3 12.0* 13.3* -- 13.6*  PROCALCITON 0.39 0.60 1.29 -- 1.95 --   Cultures: None Results for orders placed during the hospital encounter of 06/04/12  MRSA PCR SCREENING     Status: Normal   Collection Time   06/04/12  7:40 PM      Component Value Range Status Comment   MRSA by PCR NEGATIVE  NEGATIVE Final     Lab 06/10/12 0530 06/09/12 0429 06/08/12 0400 06/07/12 1030  PROCALCITON 0.39 0.60 1.29 1.95    Antibiotics: None Anti-infectives     Start     Dose/Rate Route Frequency Ordered Stop   06/04/12 1600   piperacillin-tazobactam (ZOSYN) IVPB 3.375 g  Status:  Discontinued        3.375 g 12.5 mL/hr over 240 Minutes Intravenous 3 times per day 06/04/12 1535 06/09/12 1151          A:  Empiric abx for ? aspiration P:   - d/c'd zosyn  10/25 >  follow CXR, clinical status, Pct   ENDOCRINE  Lab 06/11/12 0744 06/10/12 2055 06/10/12 1719 06/10/12 1138 06/10/12 0754  GLUCAP 88 81 171* 126* 101*   A:  Presumed adrenal suppression from chronic steroids . On steroids d/t temporal arteritis P:   -stopped hydrocort and change back to her home pred 10mg  10/25  NEUROLOGIC  A:  AMS post arrest, ? ICH or ? CVA. CT head 06/04/12 - no acute changes P:   -restart home Keppra and lexapro  BEST PRACTICE / DISPOSITION Level of Care:  ICU >> to floor 10/26 Primary Service:  PCCM Consultants:  Cards Code Status:  Partial NCB Diet:  Heart healthy, diabetic DVT Px:  SCD GI Px:  PPI Skin Integrity:  intact Social / Family:     Sandrea Hughs, MD Pulmonary and Critical Care Medicine [facility] Cell [phone]

## 2012-06-12 ENCOUNTER — Inpatient Hospital Stay (HOSPITAL_COMMUNITY): Payer: Medicare Other

## 2012-06-12 DIAGNOSIS — R079 Chest pain, unspecified: Secondary | ICD-10-CM

## 2012-06-12 LAB — GLUCOSE, CAPILLARY
Glucose-Capillary: 164 mg/dL — ABNORMAL HIGH (ref 70–99)
Glucose-Capillary: 97 mg/dL (ref 70–99)

## 2012-06-12 MED ORDER — REGADENOSON 0.4 MG/5ML IV SOLN
INTRAVENOUS | Status: AC
  Administered 2012-06-12: 0.4 mg
  Filled 2012-06-12: qty 5

## 2012-06-12 MED ORDER — TECHNETIUM TC 99M SESTAMIBI GENERIC - CARDIOLITE
10.0000 | Freq: Once | INTRAVENOUS | Status: AC | PRN
  Administered 2012-06-12: 10 via INTRAVENOUS

## 2012-06-12 MED ORDER — TECHNETIUM TC 99M SESTAMIBI GENERIC - CARDIOLITE
30.0000 | Freq: Once | INTRAVENOUS | Status: AC | PRN
  Administered 2012-06-12: 30 via INTRAVENOUS

## 2012-06-12 NOTE — Clinical Social Work Placement (Addendum)
    Clinical Social Work Department CLINICAL SOCIAL WORK PLACEMENT NOTE 06/12/2012  Patient:  [name], [name]  Account Number:  [id number] Admit date:  06/04/2012  Clinical Social Worker:  Margaree Mackintosh  Date/time:  06/12/2012 12:48 PM  Clinical Social Work is seeking post-discharge placement for this patient at the following level of care:   SKILLED NURSING   (*CSW will update this form in Epic as items are completed)   06/12/2012  Patient/family provided with [facility] Department of Clinical Social Work's list of facilities offering this level of care within the geographic area requested by the patient (or if unable, by the patient's family).  06/12/2012  Patient/family informed of their freedom to choose among providers that offer the needed level of care, that participate in Medicare, Medicaid or managed care program needed by the patient, have an available bed and are willing to accept the patient.  06/12/2012  Patient/family informed of [facility]' ownership interest in [facility], as well as of the fact that they are under no obligation to receive care at this facility.  PASARR submitted to EDS on 06/12/2012 PASARR number received from EDS on 06/12/2012  FL2 transmitted to all facilities in geographic area requested by pt/family on  06/12/2012 FL2 transmitted to all facilities within larger geographic area on   Patient informed that his/her managed care company has contracts with or will negotiate with  certain facilities, including the following:     Patient/family informed of bed offers received:  06/13/12  Patient chooses bed at  [facility] Physician recommends and patient chooses bed at  [facility]  Patient to be transferred to [facility] on  06/14/12 Patient to be transferred to facility by [facility]  The following physician request were entered in Epic:   Additional Comments:

## 2012-06-12 NOTE — Clinical Social Work Psychosocial (Signed)
     Clinical Social Work Department BRIEF PSYCHOSOCIAL ASSESSMENT 06/12/2012  Patient:  [name], [name]     Account Number:  [id number]     Admit date:  06/04/2012  Clinical Social Worker:  Margaree Mackintosh  Date/Time:  06/12/2012 12:11 PM  Referred by:  Physician  Date Referred:  06/12/2012 Referred for  SNF Placement   Other Referral:   Interview type:  Family Other interview type:   Pt currently not fully alert and oriented, per MD.    PSYCHOSOCIAL DATA Living Status:  FAMILY Admitted from facility:   Level of care:   Primary support name:  [name]: [phone] Primary support relationship to patient:  CHILD, ADULT Degree of support available:   Adequate.    CURRENT CONCERNS Current Concerns  Post-Acute Placement   Other Concerns:    SOCIAL WORK ASSESSMENT / PLAN Clinical Social Worker recieved referral from MD indicating need for SNF at dc.  CSW staffed case with MD and reviewed chart.  No family currently at bedside and pt  not fully alert and oriented.  CSW phoned pt's son, [name] [phone]).  CSW introduced self, explained role, and provided support.  CSW reviewed PT recommendations-son agreeable to SNF with a prefernce to [facility], if possible.  CSW to begin [facility] search.  CSW provided opportunity for son to process feelings.  CSW validated son's feelings.  CSW inquired about a Palliative meeting to discuss Goals of Care; son open to meeting.  CSW notified MD.  CSW to continue to follow and assist as needed.   Assessment/plan status:  Information/Referral to [facility] Other assessment/ plan:   Information/referral to community resources:   SNF  Palliative.    PATIENTS/FAMILYS RESPONSE TO PLAN OF CARE: Pt currently unable to fully participate in assessment due to medical condition. Son was pleasant and engaged in conversation.  Son thanked CSW for intervention.

## 2012-06-12 NOTE — Progress Notes (Signed)
PT Cancellation Note  Patient Details Name: ADIE VILAR MRN: [id number] DOB: Apr 04, 1931   Cancelled Treatment:     pt just back from stress test and unable to participate.  Will try back as able.  06/12/2012  Plum Bing, PT [phone] (pager)     Braeleigh Pyper, Eliseo Gum 06/12/2012, 5:26 PM

## 2012-06-12 NOTE — Progress Notes (Signed)
Name: [name] MRN: [id number] DOB: 05-30-1931    LOS: 8 Date of admit. 06/04/2012 PCP is [name], MD   Referring Provider: [facility] Reason for Referral:  VDRF secondary cardiac arrest.   PULMONARY / CRITICAL CARE MEDICINE  HPI:  78 yo AAF with MetS, CKD - stage 3, arthritis nnos. MGUS,  and hx of temporal ateritis who was on the commode  06/04/2012,  vomited, and had an acute change in mental status(declined) and EMS found her marginally responsive. She then became asystole and required CPR and epinephrine injection. Pulse was returned but she remained vasopressor dependent. She was intubated and 3 port cvl placed(rt fem) per EDP. PCCM asked to assume her care 06-04-12 12:40pm.    Events Since Admission: 06/04/2012 11:33 AM > Post arrest. Admit 06/04/12 pm - Post admission PEA arrest in ICU. Made LCB 06/05/12: Sedated  On vent. Critically ill. Attempting to follow commands. ECHO  RV dilatation. LVEF normal 10/22: RASS -2 off sedation, following commands. Making urine. Not on pressors. NO DVT 06/07/12 - extubated 06/08/12 - repeat dopplers - negative DVT again 10./27/13: On 3 west, c/o gen chest tight bilat with deep insp- present for years, better p nebs   SUBJECTIVE/OVERNIGHT/INTERVAL HX   RN reports no overnight problems. REturned from cardiac stress test and in lab c/o dyspnea. Had mild hypoxemia and placed on 2L [place]. To me she looks no diferent from 06/08/12 - still with hypoactive delirium and very deconditioned   Vital Signs: Temp:  [97.3 F (36.3 C)-98.7 F (37.1 C)] 98.1 F (36.7 C) (10/28 0500) Pulse Rate:  [70-80] 70  (10/28 0730) Resp:  [18-20] 18  (10/28 0500) BP: (132-182)/(76-110) 174/97 mmHg (10/28 1117) SpO2:  [90 %-98 %] 95 % (10/28 0740) Weight:  [62.778 kg (138 lb 6.4 oz)] 62.778 kg (138 lb 6.4  oz) (10/28 0500) FIO2  1 - 2lpm  Physical Examination: General:  Frail, chronically > acutely ill :   HEENT:  Left pupil 6 cm and rt 3 and NR. No LAN Neck:  No JVD Cardiovascular:  HSIR IR Lungs:  Coarse rhonchi bilat Abdomen:  Soft , no bs Musculoskeletal:  intact Skin:  Warm Neuro: Hypoactive delirium +  Principal Problem:  *Cardiac arrest Active Problems:  Acute renal failure  Temporal arteritis  Respiratory failure, acute  Atrial fibrillation  NSTEMI (non-ST elevated myocardial infarction)  Hypokalemia No results found.   ASSESSMENT AND PLAN  PULMONARY  Lab 06/06/12 0947  PHART 7.462*  PCO2ART 23.4*  PO2ART 160.0*  HCO3 16.5*  O2SAT 99.6      ETT:  10-20 > 10/23  A:  VDRF secondary to arrest, presumed cardiac or neuro, ? Vasovagal/ resolved. DVT x 2 doppler negative. VQ Inter Prob but heparin dc'ed      Still 02 dep        History of chronic chest  tightness atypical cp c/w asthma or gerd On 06/12/2012 : ? Mild resp distress per RN but to me no different from 06/08/12 PLAN - wean 02 as tol - Change nebs to bud/formoterol and change tenormin to bisoprolol (ideally shouldn't be on timolol eye drops either - Change reglan to ac and hs short term only      CARDIOVASCULAR  Lab 06/07/12 0947 06/06/12 2018  TROPONINI -- --  LATICACIDVEN 1.7 2.9*  PROBNP -- --   ECG:  Afib, BBB, ant lat ischemia Lines:  10-20 rt fem cvl>> 10/25 10-20 lt fem aline>> ? date  A: Presumed cardiac event  ?etiology.  Bradycardia>>PEA>>asystole. Severe hypoxia, Acute pulm edema.  Hx of CAD.  Doppler US > negative for DVT x 2. Intermediate prob V/Q scan.   - echo 06/05/12: Systolic function was normal. The estimated ejection fraction was in the range of 60% to 65%. Doppler parameters are consistent with abnormal left ventricular relaxation (grade 1 diastolic dysfunction). - Right ventricle: RV appears dilated. RV function appears depressed but cannot evaluate fully as not  well seen in all views. - Tricuspid valve: Mild-moderate regurgitation. - Pulmonary arteries: PA peak pressure: 35mm Hg (S).  P:  - V/Q intermediate prob with ventilatory defects and perfusion defects on L. In absence of hypoxemia, likelihood of life-threatening PE is low in this setting. Would defer further workup as she would not tolerate CT-PA well due to renal status. Empiric anticoagulation carries its own significant risks - would defer;  stopped heparin 10/25 W/u cp per Cards   RENAL  Lab 06/11/12 1930 06/10/12 0530 06/09/12 0429 06/08/12 1327 06/07/12 0450  NA 137 140 138 140 137  K 4.6 2.9* -- -- --  CL 102 102 98 100 99  CO2 22 26 23 27 24   BUN 38* 36* 35* 34* 41*  CREATININE 1.39* 1.41* 1.53* 1.68* 1.86*  CALCIUM 9.1 8.9 9.1 8.7 8.2*  MG -- 1.7 1.9 2.0 1.8  PHOS -- 2.1* 2.7 2.6 3.2   Intake/Output      10/27 0701 - 10/28 0700 10/28 0701 - 10/29 0700   P.O. 240    Total Intake(mL/kg) 240 (3.8)    Net +240         Urine Occurrence 5 x    Stool Occurrence 1 x     Foley: 10-20  A:  Renal Insuff,   > resolving       Hypokalemi 10/26 > rx  P:   -follow BMP  GASTROINTESTINAL No results found for this basename: AST:5,ALT:5,ALKPHOS:5,BILITOT:5,PROT:5,ALBUMIN:5 in the last 168 hours  A:  Nausea, vomiting at admit  P:   - started diet 10/25  HEMATOLOGIC  Lab 06/10/12 0530 06/09/12 0429 06/08/12 0400 06/07/12 2335 06/07/12 0450 06/07/12 0002  HGB 13.7 14.9 12.8 12.9 -- 13.1  HCT 39.8 41.6 36.9 37.1 -- 36.7  PLT 99* 90* 63* 64* -- 50*  INR -- -- 1.25 -- 1.41 --  APTT -- -- -- -- -- --   A:  Falling Hgb 06/04/12:  ?source, s/p 2 unit prbc 06/04/12 and heparin drip dc'ed Thrombocytopneia since 06/04/12   (she was  admitted 05/26/12 - 05/29/12 for encephalopathy and during this time Plat  Was 132 on 05/27/12, 172 on 10/9/1 ); HITT panel negative 10/22  P:  - Monitor without Heparin drip  - Follow CBC   INFECTIOUS  Lab 06/10/12 0530 06/09/12 0429 06/08/12  0400 06/07/12 2335 06/07/12 1030 06/07/12 0002  WBC 8.2 10.3 12.0* 13.3* -- 13.6*  PROCALCITON 0.39 0.60 1.29 -- 1.95 --   Cultures: None Results for orders placed during the hospital encounter of 06/04/12  MRSA PCR SCREENING     Status: Normal   Collection Time   06/04/12  7:40 PM      Component Value Range Status Comment   MRSA by PCR NEGATIVE  NEGATIVE Final     Lab 06/10/12 0530 06/09/12 0429 06/08/12 0400 06/07/12 1030  PROCALCITON 0.39 0.60 1.29 1.95    Antibiotics: None Anti-infectives     Start     Dose/Rate Route Frequency Ordered Stop   06/04/12 1600   piperacillin-tazobactam (ZOSYN) IVPB 3.375 g  Status:  Discontinued        3.375 g 12.5 mL/hr over 240 Minutes Intravenous 3 times per day 06/04/12 1535 06/09/12 1151          A:  Empiric abx for ? Aspiration. S/p zosyn ending 06/09/12 P:   -  follow CXR, clinical status, Pct   ENDOCRINE  Lab 06/11/12 2021 06/11/12 1632 06/11/12 1111 06/11/12 0744 06/10/12 2055  GLUCAP 85 138* 181* 88 81   A:  Presumed adrenal suppression from chronic steroids . On steroids d/t temporal arteritis P:   -stopped hydrocort and change back to her home pred 10mg  10/25  NEUROLOGIC  A:  AMS post arrest, ? ICH or ? CVA. CT head 06/04/12 - no acute changes P:   -restart home Keppra and lexapro  BEST PRACTICE / DISPOSITION Level of Care:  ICU >> to floor 10/26 Primary Service:  PCCM Consultants:  Cards Code Status:  Partial NCB.  Diet:  Heart healthy, diabetic DVT Px:  SCD GI Px:  PPI Skin Integrity:  intact Social / Family: None at bedside. Will benefit from pallitive care consult for dispo and further goals. Will ask social worker to evaluate      Dr. Kalman Shan, M.D., [facility].C.P Pulmonary and Critical Care Medicine Staff Physician [facility] Pager: [phone], If no answer or between  15:00h - 7:00h: call [phone]  06/12/2012 11:46 AM

## 2012-06-12 NOTE — Progress Notes (Signed)
Patient Name: [name] Date of Encounter: 06/12/2012   Principal Problem:  *Cardiac arrest Active Problems:  Acute renal failure  Temporal arteritis  Respiratory failure, acute  Atrial fibrillation  NSTEMI (non-ST elevated myocardial infarction)  Hypokalemia   SUBJECTIVE  C/o mild chest pain under her left breast - worse with palpation.  For sestamibi today.  CURRENT MEDS    . antiseptic oral rinse  15 mL Mouth Rinse BID  . arformoterol  15 mcg Nebulization BID  . bisoprolol  5 mg Oral Daily  . brimonidine  1 drop Left Eye Q12H  . brinzolamide  1 drop Right Eye TID  . budesonide  0.5 mg Nebulization BID  . escitalopram  2.5 mg Oral Daily  . insulin aspart  0-15 Units Subcutaneous TID WC  . insulin aspart  0-5 Units Subcutaneous QHS  . levETIRAcetam  250 mg Oral Q12H  . metoCLOPramide  5 mg Oral TID AC & HS  . pantoprazole  40 mg Oral Daily  . potassium chloride  20 mEq Oral Q3H  . potassium chloride  40 mEq Oral Once  . predniSONE  10 mg Oral Q breakfast  . regadenoson      . spironolactone  12.5 mg Oral Daily  . timolol  1 drop Left Eye Q12H  . DISCONTD: albuterol  2.5 mg Nebulization Q6H  . DISCONTD: atenolol  25 mg Oral Daily  . DISCONTD: ipratropium  0.5 mg Nebulization Q6H   OBJECTIVE  Filed Vitals:   06/12/12 0009 06/12/12 0130 06/12/12 0500 06/12/12 0631  BP: 177/109 132/76 162/104 148/92  Pulse:   71   Temp:   98.1 F (36.7 C)   TempSrc:      Resp:   18   Height:      Weight:   138 lb 6.4 oz (62.778 kg)   SpO2:   92%     Intake/Output Summary (Last 24 hours) at 06/12/12 0908 Last data filed at 06/11/12 2330  Gross per 24 hour  Intake    240 ml  Output      0 ml  Net    240 ml   Filed Weights   06/09/12 0448 06/11/12 0500 06/12/12 0500  Weight: 138 lb 0.1 oz (62.6 kg) 138 lb (62.596 kg) 138 lb 6.4 oz (62.778 kg)   PHYSICAL EXAM  General: Pleasant, NAD. Neuro: Alert and oriented X 3. Moves all extremities spontaneously. Psych:  Flat affect. HEENT:  Normal  Neck: Supple without bruits or JVD. Lungs:  Resp regular and unlabored, diminished @ bases. Heart: RRR no s3, s4, or murmurs. Abdomen: Soft, non-tender, non-distended, BS + x 4. Left chest wall tenderness present on light palpation. Extremities: No clubbing, cyanosis or edema. DP/PT/Radials 2+ and equal bilaterally.  Accessory Clinical Findings  CBC  Basename 06/10/12 0530  WBC 8.2  NEUTROABS --  HGB 13.7  HCT 39.8  MCV 90.2  PLT 99*   Basic Metabolic Panel  Basename 06/11/12 1930 06/10/12 0530  NA 137 140  K 4.6 2.9*  CL 102 102  CO2 22 26  GLUCOSE 105* 118*  BUN 38* 36*  CREATININE 1.39* 1.41*  CALCIUM 9.1 8.9  MG -- 1.7  PHOS -- 2.1*   TELE  Seen in Nuc med.   ASSESSMENT AND PLAN  1.  PEA Arrest:  Not felt to be 2/2 PE.  Nl LV fxn.  + trop.  For MV today.  2.  CAD:  Some reproducible c/p this AM.  For  MV today.  Cont current meds.  3.  HTN:  Trend bp today and adjust meds as needed.  Signed,  Nicolasa Ducking NP   I have seen, examined the patient, and reviewed the above assessment and plan.  Changes to above are made where necessary.  Pt with atypical chest pain, but NSTEMI earlier this admit.  She is s/p myoview today.   Her myoview reveals hyperdynamic LV.  Due to imaging difficulty, the study is inconclusive however there was no obvious ischemia. I think that we should manage medically at this time.  No further CV evaluation planned at this point. Please call with questions.   Co Sign: Hillis Range, MD 06/12/2012 3:00 PM

## 2012-06-12 NOTE — Progress Notes (Signed)
Palliative Medicine Team consult for goals of care, symptom management, prognosis requested by Dr Marchelle Gearing; spoke with patient at bedside, who is awake, oriented to self, place, not situation or time stated-"tell my son I just got to the hospital and he needs to come soon"  was able to tell writer to call her son, '[name]'   Contacted patient's son Reida Hem [phone] -meeting time confirmed for tomorrow, Tuesday 06/13/12 @ 1:00 pm.   Valente David, RN 06/12/2012, 1:38 PM Palliative Medicine Team RN Liaison [phone]

## 2012-06-12 NOTE — Plan of Care (Signed)
Problem: Phase I Progression Outcomes Goal: Other Phase I Outcomes/Goals Outcome: Completed/Met Date Met:  06/12/12 Awaiting results of myoview today

## 2012-06-13 DIAGNOSIS — I10 Essential (primary) hypertension: Secondary | ICD-10-CM

## 2012-06-13 LAB — GLUCOSE, CAPILLARY: Glucose-Capillary: 127 mg/dL — ABNORMAL HIGH (ref 70–99)

## 2012-06-13 MED ORDER — SPIRONOLACTONE 25 MG PO TABS
25.0000 mg | ORAL_TABLET | Freq: Every day | ORAL | Status: DC
  Administered 2012-06-14: 25 mg via ORAL
  Filled 2012-06-13: qty 1

## 2012-06-13 MED ORDER — SPIRONOLACTONE 25 MG PO TABS: 25.0000 mg | ORAL_TABLET | Freq: Every day | ORAL | Status: DC

## 2012-06-13 MED ORDER — ESCITALOPRAM OXALATE 5 MG PO TABS
2.5000 mg | ORAL_TABLET | Freq: Every day | ORAL | Status: DC
  Administered 2012-06-14: 2.5 mg via ORAL

## 2012-06-13 MED ORDER — METOCLOPRAMIDE HCL 5 MG PO TABS
5.0000 mg | ORAL_TABLET | Freq: Three times a day (TID) | ORAL | Status: DC | PRN
Start: 2012-06-13 — End: 2012-06-14
  Filled 2012-06-13: qty 1

## 2012-06-13 NOTE — Progress Notes (Signed)
Family concerned that patient had GI bleed and was not seen by GI, called and discussed with Dr Marchelle Gearing, who will call GI consult.

## 2012-06-13 NOTE — Progress Notes (Signed)
Patient [name]      DOB: [birth year]      AVW:[id number]     Consult Note from the Palliative Medicine Team at [facility]    Consult Requested by: Kalman Shan     PCP: [name], MD Reason for Consultation: Goals of care     Phone Number:[phone]  Assessment of patients Current state: 78 y/o female with CKD, stage 3, temporal arteritis, atrial fibrillation, who was admitted with PEA cardiac arrest. Patient had bloody bowel movement in the ED requiring CPR, and intubation. Patient extubated and is currently stable, patient wants to be DNR, and does not want agressive management if she ever undergoes cardiac arrest again, family supports the patient's decision.   Goals of Care: 1.  Code Status: DNR   2. Scope of Treatment: 1. Vital Signs: per protocol  2. Respiratory/Oxygen: via nasal canula 3. Nutritional Support/Tube Feeds: No long term artifical nutrition with peg tube 4. Antibiotics: Continue as needed 5. Blood Products: Continue as needed 6. IVF: Continue as needed 7. Review of Medications to be discontinued: none 8. Labs: Continue as needed  3. Disposition: SNF      Patient Documents Completed or Given: Document Given Completed  Advanced Directives Pkt    MOST    DNR    Gone from My Sight    Hard Choices      Brief HPI: 78 y/o female with CKD, stage 3, temporal arteritis, atrial fibrillation, who was admitted with PEA cardiac arrest. Patient had bloody bowel movement in the ED requiring CPR, and intubation. She was admitted to [facility], she was extubated on 10/23. She was followed by cardiology and had cardiac  myoview which did not show ischemia. Goals of care meeting done with patient and her family at bedside, discussed the code status, artificial nutrition and hydration, her current medical condition.  ROS: Anxiety- denies Breathing- denies shortness of breath Constipation- denies Nausea/vomiting- denies Depression- denies    PMH:    Past Medical History  Diagnosis Date  . Arthritis   . Hypertension   . Coronary artery disease     a. Neg stress test 2008. b. ?questionable history - pt thought she had heart cath in 1970's but details were unclear.  . Stroke     a. Details unclear.  . Seizures     a. Due to hyponatremia, resolved.  Marland Kitchen History of gout   . Glaucoma   . Cataracts, bilateral   . Acute kidney injury   . CKD (chronic kidney disease), stage III   . Hyperlipidemia   . Gallstones   . Cerebrovascular disease   . Hyponatremia     a. H/o 2011 ?SIADH with seizures, encephalopathy at that time.  . Temporal arteritis      PSH: Past Surgical History  Procedure Date  . Eye surgery   . Abdominal hysterectomy   . Coronary angioplasty with stent placement    I have reviewed the FH and SH and  If appropriate update it with new information. Allergies  Allergen Reactions  . Lactose Intolerance (Gi) Diarrhea and Nausea Only   Scheduled Meds:   . antiseptic oral rinse  15 mL Mouth Rinse BID  . arformoterol  15 mcg Nebulization BID  . bisoprolol  5 mg Oral Daily  . brimonidine  1 drop Left Eye Q12H  . brinzolamide  1 drop Right Eye TID  . budesonide  0.5 mg Nebulization BID  . escitalopram  2.5 mg Oral Daily  .  insulin aspart  0-15 Units Subcutaneous TID WC  . insulin aspart  0-5 Units Subcutaneous QHS  . levETIRAcetam  250 mg Oral Q12H  . pantoprazole  40 mg Oral Daily  . potassium chloride  40 mEq Oral Once  . predniSONE  10 mg Oral Q breakfast  . timolol  1 drop Left Eye Q12H  . DISCONTD: escitalopram  2.5 mg Oral Daily  . DISCONTD: metoCLOPramide  5 mg Oral TID AC & HS  . DISCONTD: spironolactone  12.5 mg Oral Daily  . DISCONTD: spironolactone  25 mg Oral Daily   Continuous Infusions:   . sodium chloride Stopped (06/06/12 1300)   PRN Meds:.sodium chloride, hydrALAZINE, metoCLOPramide, traMADol, DISCONTD: fentaNYL    BP 157/99  Pulse 78  Temp 98.8 F (37.1 C) (Oral)  Resp 18  Ht 5'  2" (1.575 m)  Wt 61.417 kg (135 lb 6.4 oz)  BMI 24.76 kg/m2  SpO2 97%   PPS:   Intake/Output Summary (Last 24 hours) at 06/13/12 1358 Last data filed at 06/13/12 0800  Gross per 24 hour  Intake    120 ml  Output      0 ml  Net    120 ml     Physical Exam:  General: Appears in no acute distress HEENT: NCAT Chest: Clear bilaterally CVS: s1S2 RRR Abdomen: Soft, nontender Ext: No edema Neuro: Alert, oriented x2  Labs: CBC    Component Value Date/Time   WBC 8.2 06/10/2012 0530   RBC 4.41 06/10/2012 0530   HGB 13.7 06/10/2012 0530   HCT 39.8 06/10/2012 0530   PLT 99* 06/10/2012 0530   MCV 90.2 06/10/2012 0530   MCH 31.1 06/10/2012 0530   MCHC 34.4 06/10/2012 0530   RDW 15.0 06/10/2012 0530   LYMPHSABS 6.1* 06/04/2012 1100   MONOABS 0.6 06/04/2012 1100   EOSABS 0.1 06/04/2012 1100   BASOSABS 0.0 06/04/2012 1100    BMET    Component Value Date/Time   NA 137 06/11/2012 1930   K 4.6 06/11/2012 1930   CL 102 06/11/2012 1930   CO2 22 06/11/2012 1930   GLUCOSE 105* 06/11/2012 1930   BUN 38* 06/11/2012 1930   CREATININE 1.39* 06/11/2012 1930   CALCIUM 9.1 06/11/2012 1930   GFRNONAA 35* 06/11/2012 1930   GFRAA 41* 06/11/2012 1930    CMP     Component Value Date/Time   NA 137 06/11/2012 1930   K 4.6 06/11/2012 1930   CL 102 06/11/2012 1930   CO2 22 06/11/2012 1930   GLUCOSE 105* 06/11/2012 1930   BUN 38* 06/11/2012 1930   CREATININE 1.39* 06/11/2012 1930   CALCIUM 9.1 06/11/2012 1930   PROT 8.8* 11/01/2011 1945   ALBUMIN 3.2* 11/01/2011 1945   AST 17 11/01/2011 1945   ALT 12 11/01/2011 1945   ALKPHOS 58 11/01/2011 1945   BILITOT 0.3 11/01/2011 1945   GFRNONAA 35* 06/11/2012 1930   GFRAA 41* 06/11/2012 1930        Time In Time Out Total Time Spent with Patient Total Overall Time  1 pm  2 30 pm 60 min 90 min    Greater than 50%  of this time was spent counseling and coordinating care related to the above assessment and  plan.

## 2012-06-13 NOTE — Progress Notes (Signed)
Clinical Social Worker met with family and reviewed that [name] is able to offer a bed; family agreeable.  CSW encouraged family to phone or meet with [name] to sign paperwork and confirm their interest in facility.  Family reports they were told by PMT MD that PCCM NP will be meeting with them shortly.  CSW contacted PCCM who reports they will follow up with family tomorrow morning.  CSW left message with PMT MD requesting they follow up with family.  CSW to continue to follow and assist as needed.   Angelia Mould, MSW, [place] [phone]

## 2012-06-13 NOTE — Progress Notes (Signed)
Cancel Treatment Note   06/13/12 1600  PT Visit Information  Last PT Received On 06/13/12  Reason Eval/Treat Not Completed  Other (comment) (patient politely declined OOB mobility at this time. RN instructed PT not to push patient due she may be going under palliative care.     Lewis Shock, PT, DPT Pager #: [phone] Office #: [phone]

## 2012-06-13 NOTE — Progress Notes (Signed)
Clinical Social Worker left voicemail message with Cecilie Lowers, admissions and expressed family's interest.  CSW to continue to follow and assist as needed.   Angelia Mould, MSW, [place] [phone]

## 2012-06-13 NOTE — Consult Note (Signed)
Reason for Consult: Heme positive stool and recent report of hematochezia Referring Physician: CCM  Ayesha Mohair HPI: This is a 78 year old female who is s/p PEA during this admission, HTN, Temporal ARteritis, CAD, stroke, and multiple medical problems who presented with AMS.  She was brought in by the family after she had an AMS episode on the toilet.  In the ER she was found to be in PEA and she responded to pressors.  Of the course of the hospitalization she was able to markedly improve and transfer to the floor.  There was some report that she had hematochezia at the time of this admission or periadmission, but she reports having hematochezia one year ago.  No prior colonoscopy.  She denied any history of ABM pain, nausea, vomiting, or diarrhea.  She is noted to be heme positive, but her HGB has remained stable during this admission.  The family wanted a GI consultation to discern if she needs further work up.  Past Medical History  Diagnosis Date  . Arthritis   . Hypertension   . Coronary artery disease     a. Neg stress test 2008. b. ?questionable history - pt thought she had heart cath in 1970's but details were unclear.  . Stroke     a. Details unclear.  . Seizures     a. Due to hyponatremia, resolved.  Marland Kitchen History of gout   . Glaucoma   . Cataracts, bilateral   . Acute kidney injury   . CKD (chronic kidney disease), stage III   . Hyperlipidemia   . Gallstones   . Cerebrovascular disease   . Hyponatremia     a. H/o 2011 ?SIADH with seizures, encephalopathy at that time.  . Temporal arteritis     Past Surgical History  Procedure Date  . Eye surgery   . Abdominal hysterectomy   . Coronary angioplasty with stent placement     Family History  Problem Relation Age of Onset  . Hypertension    . Diabetes type II      Social History:  reports that she has quit smoking. She has never used smokeless tobacco. She reports that she does not drink alcohol or use illicit  drugs.  Allergies:  Allergies  Allergen Reactions  . Lactose Intolerance (Gi) Diarrhea and Nausea Only    Medications:  Scheduled:   . antiseptic oral rinse  15 mL Mouth Rinse BID  . arformoterol  15 mcg Nebulization BID  . bisoprolol  5 mg Oral Daily  . brimonidine  1 drop Left Eye Q12H  . brinzolamide  1 drop Right Eye TID  . budesonide  0.5 mg Nebulization BID  . escitalopram  2.5 mg Oral Daily  . insulin aspart  0-15 Units Subcutaneous TID WC  . insulin aspart  0-5 Units Subcutaneous QHS  . levETIRAcetam  250 mg Oral Q12H  . pantoprazole  40 mg Oral Daily  . predniSONE  10 mg Oral Q breakfast  . spironolactone  25 mg Oral Daily  . timolol  1 drop Left Eye Q12H  . DISCONTD: escitalopram  2.5 mg Oral Daily  . DISCONTD: metoCLOPramide  5 mg Oral TID AC & HS  . DISCONTD: potassium chloride  40 mEq Oral Once  . DISCONTD: spironolactone  12.5 mg Oral Daily  . DISCONTD: spironolactone  25 mg Oral Daily   Continuous:   . sodium chloride Stopped (06/06/12 1300)    Results for orders placed during the hospital encounter of  06/04/12 (from the past 24 hour(s))  GLUCOSE, CAPILLARY     Status: Normal   Collection Time   06/12/12  4:59 PM      Component Value Range   Glucose-Capillary 97  70 - 99 mg/dL   Comment 1 Notify RN    GLUCOSE, CAPILLARY     Status: Abnormal   Collection Time   06/12/12  9:58 PM      Component Value Range   Glucose-Capillary 127 (*) 70 - 99 mg/dL  GLUCOSE, CAPILLARY     Status: Abnormal   Collection Time   06/13/12  8:21 AM      Component Value Range   Glucose-Capillary 113 (*) 70 - 99 mg/dL  GLUCOSE, CAPILLARY     Status: Abnormal   Collection Time   06/13/12 11:28 AM      Component Value Range   Glucose-Capillary 187 (*) 70 - 99 mg/dL     Nm Myocar Multi W/spect W/wall Motion / Ef  06/12/2012  Patient underwent lexiscan myoview.  EKG showed no ischemic changes during or after lexiscan stress.  Patient complained of chest pain prior to  lexiscan and during and after lexiscan.  The quality of the images is poor because of extremely small LV cavity. Images do not demonstrate LV cavity in a satisfactory manner. Images may be off-axis because of inadequate cavity imaging.  LV function appears to be extremely hyperdynamic with LV EDV of 12 ml and  ESV of 1 ml. This yields a calculated EF of 94%  Perfusion imaging does not appear to show any obvious perfusion defects but this conclusion is in the context of imaging challenges as noted above.  Myocardial ischemia cannot be reliably excluded by this study.  Impression: Inconclusive Lexiscan Myoview.  Thomas A. Brackbill MD   Original Report Authenticated By: Heriberto Antigua     ROS:  As stated above in the HPI otherwise negative.  Blood pressure 150/91, pulse 73, temperature 98.1 F (36.7 C), temperature source Oral, resp. rate 17, height 5\' 2"  (1.575 m), weight 61.417 kg (135 lb 6.4 oz), SpO2 100.00%.    PE: Gen: NAD, Alert and Oriented, weak and soft soken. HEENT:  [place]/AT, EOMI Neck: Supple, no LAD Lungs: CTA Bilaterally CV: RRR without M/G/R ABM: Soft, NTND, +BS Ext: No C/C/E  Assessment/Plan: 1) Heme positive stool. 2) ? History of hematochezia. 3) S/p PEA. 4) Multiple medical problems.   The patient was fortunate to survive PEA.  She has multiple medical problems and I do not feel a colonoscopy would be beneficial to her care.  Her HGB is stable and there is no overt evidence of bleeding at this time.  There was one value where her HGB was at 8.8 g/dL, but the rest of the values range around 13 g/dL.  Additionally, the patient does not want a colonoscopy at this time.  Certainly, if her HGB drops and there is concurrent hematochezia or melena there issue of endoscopic evaluation can be pursued.  Plan: 1) Follow HGB. 2) No GI evaluation at this time. 3) Call if there are any acute changes.  Veida Spira D 06/13/2012, 4:47 PM

## 2012-06-13 NOTE — Progress Notes (Signed)
Name: [name] MRN: [id number] DOB: October 08, 1930    LOS: 9 Date of admit. 06/04/2012 PCP is [name], MD   Referring Provider: [facility] Reason for Referral:  VDRF secondary cardiac arrest.   PULMONARY / CRITICAL CARE MEDICINE  HPI:  78 yo AAF with MetS, CKD - stage 3, arthritis nnos. MGUS,  and hx of temporal ateritis who was on the commode  06/04/2012,  vomited, and had an acute change in mental status(declined) and EMS found her marginally responsive. She then became asystole and required CPR and epinephrine injection. Pulse was returned but she remained vasopressor dependent. She was intubated and 3 port cvl placed(rt fem) per EDP. PCCM asked to assume her care 06-04-12 12:40pm.    Events Since Admission: 06/04/2012 11:33 AM > Post arrest. Admit 06/04/12 pm - Post admission PEA arrest in ICU. Made LCB 06/05/12: Sedated  On vent. Critically ill. Attempting to follow commands. ECHO  RV dilatation. LVEF normal 10/22: RASS -2 off sedation, following commands. Making urine. Not on pressors. NO DVT 06/07/12 - extubated 06/08/12 - repeat dopplers - negative DVT again 10/25 VQ intermed prob 10./27/13: On 3 west, c/o gen chest tight bilat with deep insp- present for years, better p nebs 06/12/12: cards eval: Her myoview reveals hyperdynamic LV. Due to imaging difficulty, the study is inconclusive however there was no obvious ischemia.  I think that we should manage medically at this time. No further CV evaluation planned at this point per Dr Johney Frame of cards   SUBJECTIVE/OVERNIGHT/INTERVAL HX   RN reports no overnight problems.  To me she looks no diferent from 06/08/12 - still with hypoactive delirium and very deconditioned. orinted x 2 per RN.   Palliative care meet at 1pm today Bed available at [facility] RN says she can likely  progress with diet   Vital Signs: Temp:  [98.2 F (36.8 C)-98.8 F (37.1 C)] 98.8 F (37.1 C) (10/29 0500) Pulse Rate:  [74-79] 78  (10/29 0500) Resp:  [18-20] 18  (10/29 0500) BP: (138-177)/(78-104) 157/99 mmHg (10/29 0716) SpO2:  [93 %-97 %] 97 % (10/29 0844) Weight:  [61.417 kg (135 lb 6.4 oz)] 61.417 kg (135 lb 6.4 oz) (10/29 0500) FIO2  1 - 2lpm  Physical Examination: General:  Frail, chronically HEENT:  Left pupil 6 cm and rt 3 and NR. No LAN Neck:  No JVD Cardiovascular:  HSIR IR Lungs:  Coarse rhonchi bilat Abdomen:  Soft , no bs Musculoskeletal:  intact Skin:  Warm Neuro: Alert. RASS 0. Oriented x 2.  Principal Problem:  *Cardiac arrest Active Problems:  Acute renal failure  Temporal arteritis  Respiratory failure, acute  Atrial fibrillation  NSTEMI (non-ST elevated myocardial infarction)  Hypokalemia Nm Myocar Multi W/spect W/wall Motion / Ef  06/12/2012  Patient underwent lexiscan myoview.  EKG showed no ischemic changes during or after lexiscan stress.  Patient  complained of chest pain prior to lexiscan and during and after lexiscan.  The quality of the images is poor because of extremely small LV cavity. Images do not demonstrate LV cavity in a satisfactory manner. Images may be off-axis because of inadequate cavity imaging.  LV function appears to be extremely hyperdynamic with LV EDV of 12 ml and  ESV of 1 ml. This yields a calculated EF of 94%  Perfusion imaging does not appear to show any obvious perfusion defects but this conclusion is in the context of imaging challenges as noted above.  Myocardial ischemia cannot be reliably excluded by this study.  Impression: Inconclusive Lexiscan Myoview.  Thomas A. Brackbill MD   Original Report Authenticated By: Heriberto Antigua      ASSESSMENT AND PLAN  PULMONARY No results found for this basename: PHART:5,PCO2:5,PCO2ART:5,PO2ART:5,HCO3:5,O2SAT:5 in the last 168 hours    ETT:  10-20 > 10/23  A:  VDRF secondary to  arrest, presumed cardiac or neuro, ? Vasovagal/ resolved.    -  DVT x 2 doppler negative. VQ Inter Prob but heparin dc'ed  - ECHO 10/21 - RV dilatation  - Myoview 10/28: poor qualuity but likely non ischemic  On 06/12/2012 : ? Mild resp distress per RN but to me no different from 06/08/12 On 06/13/12: 2L [place] and unchanged PLAN - wean 02 as tol - Change nebs to bud/formoterol and change tenormin to bisoprolol (ideally shouldn't be on timolol eye drops either    CARDIOVASCULAR  Lab 06/07/12 0947 06/06/12 2018  TROPONINI -- --  LATICACIDVEN 1.7 2.9*  PROBNP -- --   ECG:  Afib, BBB, ant lat ischemia Lines:  10-20 rt fem cvl>> 10/25 10-20 lt fem aline>> ? date  A: Presumed cardiac event  ?etiology.  Bradycardia>>PEA>>asystole. Severe hypoxia, Acute pulm edema.  Hx of CAD.    - Doppler US > negative for DVT x 2. Intermediate prob V/Q scan.   DVT x 2 doppler negative. VQ Inter Prob but heparin dc'ed  - ECHO 10/21 - RV dilatation - Empiric IV heparin for PE  stopped this admit based on risk benefit ration  - on 06/13/12: Nil acuate  -  P:  - clinically follow   RENAL  Lab 06/11/12 1930 06/10/12 0530 06/09/12 0429 06/08/12 1327 06/07/12 0450  NA 137 140 138 140 137  K 4.6 2.9* -- -- --  CL 102 102 98 100 99  CO2 22 26 23 27 24   BUN 38* 36* 35* 34* 41*  CREATININE 1.39* 1.41* 1.53* 1.68* 1.86*  CALCIUM 9.1 8.9 9.1 8.7 8.2*  MG -- 1.7 1.9 2.0 1.8  PHOS -- 2.1* 2.7 2.6 3.2   Intake/Output      10/28 0701 - 10/29 0700 10/29 0701 - 10/30 0700   P.O.     Total Intake(mL/kg)     Net           Foley: 10-20  A:  Renal Insuff,   > resolving       Hypokalemi 10/26 > rx  P:   -follow BMP  GASTROINTESTINAL No results found for this basename: AST:5,ALT:5,ALKPHOS:5,BILITOT:5,PROT:5,ALBUMIN:5 in the last 168 hours  A:  Nausea, vomiting at admit - 06/13/12: None   P:   - make reglan prn  HEMATOLOGIC  Lab 06/10/12 0530 06/09/12 0429 06/08/12 0400 06/07/12 2335 06/07/12  0450 06/07/12 0002  HGB 13.7 14.9 12.8 12.9 -- 13.1  HCT 39.8 41.6 36.9 37.1 -- 36.7  PLT 99* 90* 63* 64* -- 50*  INR -- --  1.25 -- 1.41 --  APTT -- -- -- -- -- --   A:  Falling Hgb 06/04/12:  ?source, s/p 2 unit prbc 06/04/12 and heparin drip dc'ed Thrombocytopneia since 06/04/12   (she was  admitted 05/26/12 - 05/29/12 for encephalopathy and during this time Plat  Was 132 on 05/27/12, 172 on 10/9/1 ); HITT panel negative 10/22  P:  - Monitor without Heparin drip  - Follow CBC   INFECTIOUS  Lab 06/10/12 0530 06/09/12 0429 06/08/12 0400 06/07/12 2335 06/07/12 1030 06/07/12 0002  WBC 8.2 10.3 12.0* 13.3* -- 13.6*  PROCALCITON 0.39 0.60 1.29 -- 1.95 --   Cultures: None Results for orders placed during the hospital encounter of 06/04/12  MRSA PCR SCREENING     Status: Normal   Collection Time   06/04/12  7:40 PM      Component Value Range Status Comment   MRSA by PCR NEGATIVE  NEGATIVE Final     Lab 06/10/12 0530 06/09/12 0429 06/08/12 0400 06/07/12 1030  PROCALCITON 0.39 0.60 1.29 1.95    Antibiotics: None Anti-infectives     Start     Dose/Rate Route Frequency Ordered Stop   06/04/12 1600   piperacillin-tazobactam (ZOSYN) IVPB 3.375 g  Status:  Discontinued        3.375 g 12.5 mL/hr over 240 Minutes Intravenous 3 times per day 06/04/12 1535 06/09/12 1151          A:  Empiric abx for ? Aspiration. S/p zosyn ending 06/09/12 P:   -  follow CXR, clinical status, Pct   ENDOCRINE  Lab 06/13/12 0821 06/12/12 2158 06/12/12 1659 06/12/12 1145 06/11/12 2021  GLUCAP 113* 127* 97 164* 85   A:  Presumed adrenal suppression from chronic steroids . On steroids d/t temporal arteritis P:   -stopped hydrocort and change back to her home pred 10mg  10/25  NEUROLOGIC  A:  AMS post arrest, ? ICH or ? CVA. CT head 06/04/12 - no acute changes P:   -t home Keppra and lexapro  BEST PRACTICE / DISPOSITION Level of Care:  ICU >> to floor 10/26 Primary Service:   PCCM Consultants:  Cards Code Status:  Partial NCB.  Diet:  Heart healthy, diabetic DVT Px:  SCD GI Px:  PPI Skin Integrity:  intact Social / Family: Palliative care meet 06/13/2012. Then dc to SNF    Dr. Kalman Shan, M.D., [facility]C.P Pulmonary and Critical Care Medicine Staff Physician [facility] Pager: [phone], If no answer or between  15:00h - 7:00h: call [phone]  06/13/2012 11:04 AM

## 2012-06-13 NOTE — Progress Notes (Signed)
   ELECTROPHYSIOLOGY ROUNDING NOTE    Patient Name: [name] Date of Encounter: 06-13-2012    SUBJECTIVE:Patient states she does not feel well because "there is a man beside her in the bed bothering her".  States chest pain is stable from yesterday (only occurs with cough).   For palliative consult today.   TELEMETRY: Reviewed telemetry pt in sinus rhythm Filed Vitals:   06/12/12 2016 06/12/12 2100 06/13/12 0500 06/13/12 0716  BP:  150/91 177/104 157/99  Pulse:  74 78   Temp:  98.2 F (36.8 C) 98.8 F (37.1 C)   TempSrc:      Resp:  18 18   Height:      Weight:   135 lb 6.4 oz (61.417 kg)   SpO2: 96% 93% 94%    No intake or output data in the 24 hours ending 06/13/12 0757  Physical Exam: Filed Vitals:   06/12/12 2100 06/13/12 0500 06/13/12 0716 06/13/12 0844  BP: 150/91 177/104 157/99   Pulse: 74 78    Temp: 98.2 F (36.8 C) 98.8 F (37.1 C)    TempSrc:      Resp: 18 18    Height:      Weight:  135 lb 6.4 oz (61.417 kg)    SpO2: 93% 94%  97%    GEN- The patient is elderly appearing, alert and oriented x 3 today.   Head- normocephalic, atraumatic Eyes-  Sclera clear, conjunctiva pink Ears- hearing intact Oropharynx- OP clear Lungs- Clear to ausculation bilaterally, normal work of breathing Heart- Regular rate and rhythm,  GI- soft, NT, ND, + BS Extremities- no clubbing, cyanosis, + dependant edema   LABS: Basic Metabolic Panel:  Basename 06/11/12 1930  NA 137  K 4.6  CL 102  CO2 22  GLUCOSE 105*  BUN 38*  CREATININE 1.39*  CALCIUM 9.1  MG --  PHOS --    Radiology/Studies: Nm Myocar Multi W/spect W/wall Motion / Ef 06/12/2012  Patient underwent lexiscan myoview.  EKG showed no ischemic changes during or after lexiscan stress.  Patient complained of chest pain prior to lexiscan and during and after lexiscan.  The quality of the images is poor because of extremely small LV cavity. Images do not demonstrate LV cavity in a satisfactory manner.  Images may be off-axis because of inadequate cavity imaging.  LV function appears to be extremely hyperdynamic with LV EDV of 12 ml and  ESV of 1 ml. This yields a calculated EF of 94%  Perfusion imaging does not appear to show any obvious perfusion defects but this conclusion is in the context of imaging challenges as noted above.  Myocardial ischemia cannot be reliably excluded by this study.  Impression: Inconclusive Lexiscan Myoview.  Thomas A. Brackbill MD   Original Report Authenticated By: Heriberto Antigua    Principal Problem:  *Cardiac arrest Active Problems:  Acute renal failure  Temporal arteritis  Respiratory failure, acute  Atrial fibrillation  NSTEMI (non-ST elevated myocardial infarction)  Hypokalemia     Assessment and Plan: 1. PEA Arrest in the setting of GI bleed and profound acidosis No further CV workup planned  2. Chest pain- atypical  No obvious ischemic by [facility] though this was an inconclusive study No further CV workup planned  3. HTN:  BP is elevated Increase spironolactone to 25mg  daily bmet in am  Palliative care consultation planned Add norvasc if BP remains elevated  Cardiology will see as needed going forward Please call with questions

## 2012-06-14 LAB — GLUCOSE, CAPILLARY: Glucose-Capillary: 121 mg/dL — ABNORMAL HIGH (ref 70–99)

## 2012-06-14 LAB — BASIC METABOLIC PANEL
BUN: 42 mg/dL — ABNORMAL HIGH (ref 6–23)
Chloride: 96 mEq/L (ref 96–112)
Creatinine, Ser: 1.75 mg/dL — ABNORMAL HIGH (ref 0.50–1.10)
GFR calc Af Amer: 31 mL/min — ABNORMAL LOW (ref >90)
GFR calc non Af Amer: 27 mL/min — ABNORMAL LOW (ref >90)

## 2012-06-14 MED ORDER — INSULIN ASPART 100 UNIT/ML ~~LOC~~ SOLN: 0.0000 [IU] | Freq: Every day | SUBCUTANEOUS | Status: DC

## 2012-06-14 MED ORDER — BUDESONIDE 0.5 MG/2ML IN SUSP: 0.5000 mg | Freq: Two times a day (BID) | RESPIRATORY_TRACT | Status: DC

## 2012-06-14 MED ORDER — ARFORMOTEROL TARTRATE 15 MCG/2ML IN NEBU: 15.0000 ug | INHALATION_SOLUTION | Freq: Two times a day (BID) | RESPIRATORY_TRACT | Status: DC

## 2012-06-14 MED ORDER — PANTOPRAZOLE SODIUM 40 MG PO TBEC: 40.0000 mg | DELAYED_RELEASE_TABLET | Freq: Every day | ORAL | Status: DC

## 2012-06-14 MED ORDER — BISOPROLOL FUMARATE 5 MG PO TABS: 5.0000 mg | ORAL_TABLET | Freq: Every day | ORAL | Status: DC

## 2012-06-14 MED ORDER — INSULIN ASPART 100 UNIT/ML ~~LOC~~ SOLN: 0.0000 [IU] | Freq: Three times a day (TID) | SUBCUTANEOUS | Status: DC

## 2012-06-14 MED ORDER — SPIRONOLACTONE 25 MG PO TABS: 25.0000 mg | ORAL_TABLET | Freq: Every day | ORAL | Status: DC

## 2012-06-14 NOTE — Discharge Summary (Signed)
Physician Discharge Summary  Patient ID: [name] MRN: [id number] DOB/AGE: September 25, 1930 78 y.o.  Admit date: 06/04/2012 Discharge date: 06/14/2012    Discharge Diagnoses:  Cardiac arrest - unclear cause.   - RV dilatation on ECHO with indet VQ and dopple legs x 2 negative. Heparin dc'ed. PE thought unlikely  - NSTEMI on enzymes but poor quality stress test was normal. NSTEMI as cause thought unlikely  - REport of GI (lower) at admit but GI consult 06/13/12 but Hgb stable and this as cause thought unlikely  - Possibly metabolic with renal failure and slight hyperkalemia NSTEMI (non-ST elevated myocardial infarction) Hypertension Atrial Fibrillation CAD Acute Respiratory Failure Encephalopathy Acute renal failure Hypokalemia Anemia Heme Positive Stool Temporal arteritis     Brief Summary: [name] is a 78 y.o. y/o female with a PMH of HTN, CAD, CKD - stage 3, arthritis,  MGUS, and hx of temporal ateritis (chronic prednisone) and recent hospitalization for UTI who was on the commode 06/04/2012, vomited, and had an acute change in mental status (declined) and EMS found her marginally responsive. She then became asystole and required CPR with ROSC but required vasopressors, intubation and ICU admit.  She subsequently had further PEA arrest of unclear etiology.  Work up essentially negative from cardiac standpoint.  PE ruled out.  Felt most likely cause of arrest was renal failure with hyperkalemia.  Question of heme positive stool on admit.  GI evaluated and patient deferred colonoscopy at this time.  She was evaluated by Palliative care and made a full DNR.  She indicates she would be ok with oxygen, antibiotics, blood products, IVF's but no intubation or artifical feedings.  During admit, she was noted to have hypokalemia and placed on aldactone.  It is recommended she have close follow up of serum creatinine and potassium.    Consults: [facility]  Palliative  Care  Lines/tubes: OETT 10/20>>>10/23  R fem cvl 10/20>>> 10/25  L fem aline10/20 >> ? date    Microbiology/Sepsis markers:  Antibiotics Zosyn 10/20>>>10/25  Significant Diagnostic Studies:  10/20 CT Head>>>no acute changes 10/21 ECHO>>> Systolic function was normal. The estimated ejection fraction was in the range of 60% to 65%. Doppler parameters are consistent with abnormal left ventricular relaxation (grade 1 diastolic dysfunction).  Right ventricle: RV appears dilated. RV function appears depressed but cannot evaluate fully as not well seen in all views.Tricuspid valve: Mild-moderate regurgitation.  Pulmonary arteries: PA peak pressure: 35mm Hg (S). 10/28 V/Q>>> intermediate prob with ventilatory defects and perfusion defects on L 10/22, 10/24 LE Doppler >>>neg for DVT x2    Events Since Admission:  10/20 11:33 AM > Post arrest. Admit  10/20 pm - Post admission PEA arrest in ICU. Made LCB  10/21 Sedated On vent. Critically ill. Attempting to follow commands. ECHO RV dilatation. LVEF normal  10/22: RASS -2 off sedation, following commands. Making urine. Not on pressors. NO DVT  10/23 - extubated  10/24 - repeat dopplers - negative DVT again  10/27 - On 3 west, c/o gen chest tight bilat with deep insp- present for years, better p nebs 10/29 - GI consult 10/30 - discharge  Hospital Summary by Discharge Diagnosis   Cardiac arrest NSTEMI (non-ST elevated myocardial infarction) CAD Hypertension Atrial Fibrillation Asystolic arrest on admit with two more subsequent PEA arrests in hospital.  Unclear cause of arrests.  Most likely metabolic derangements / renal failure with hyperkalemia as source prior to admit.  PE thought unlikely with RV dilatation on ECHO with indeterminate VQ and doppler legs x 2 negative. Heparin dc'ed. NSTEMI on enzymes but poor quality stress test was normal. Report of GI (lower) at  admit but GI consult 06/13/12 but Hgb stable and this as cause thought unlikely.  BP at discharge 160 sbp. Cardiology recommends continue lasix + aldactone and add norvasc at SNF depending on BP.   Discharge Plan: -continue lasix + aldactone  -consider add norvasc if BP remains elevated  -monitor BMP closely -- recommend next bmp to be drawn 11/1 for electrolyte review while on aldactone as she had hyperkalemia prior to admit    Acute Respiratory Failure Acute failure in setting of cardiac arrest - Resolved. PE thought unlikely.  See above.  No further interventions.   Encephalopathy  Post extubation patient had difficulties with delirium.  Currently she is oriented x2 at time of discharge.    Acute renal failure Hyper / Hypokalemia Acute renal failure noted on admit.  Then had episodes of hypokalemia during admit.  Creat improved to 1.3 10/29 but up at 1.7 on 06/14/12.  Recommend monitoring of  creatitine and K closely while on spirolactone.    Discharge Plan: -follow up BMP on 11/1 for sr cr and K review -follow up BMP with Dr. Ramond Craver on 11/8 and post hospital visit  Anemia Heme Positive Stool Abd Pain & Nausea Most likely anemia of chronic disease.  Some question of heme positive stool on admit.  She did receive PRBC's during admit.  She was evaluated by GI prior to discharge.  She refused colonoscopy at this time.  No evidence of bleeding in hospital though reports of bleeding lower GI on admit.  Also reports of abdominal pain and nausea during admit. There is report of gallstones.  GI felt this was not an issue.   This resolved with reglan.  Currently she is off reglan.    Discharge Plan: -advise PRBC for hgb < 7gm%. No evidence of bleeding in hospital though reports of bleeding lower gi at admit -follow HGB PRN -PRN reglan for nausea if needed   Temporal arteritis Chronic issue on steroids at baseline.   Discharge Plan: -continue prednisone 10 mg daily   Discharge  Exam: General: Frail, chronically ill in NAD HEENT: mm pink/moist, no jvd Cardiovascular: s1s2 irr/ irr Lungs: resp's even/non-labored on 2L per [place], lungs bilaterally clear Abdomen: Soft , bsx4 active, tol PO's Musculoskeletal: intact  Skin: Warm  Neuro: Alert. RASS 0. Oriented x 2.    Filed Vitals:   06/14/12 0500 06/14/12 0626 06/14/12 0829 06/14/12 1015  BP:  160/95  160/95  Pulse:  72    Temp:  98.6 F (37 C)    TempSrc:      Resp:  20    Height:      Weight: 135 lb 9.3 oz (61.5 kg)     SpO2:  95% 95%      Discharge Labs  BMET  Lab 06/14/12 0452 06/11/12 1930 06/10/12 0530 06/09/12 0429 06/08/12 1327  NA 133* 137 140 138 140  K 4.1 4.6 -- -- --  CL 96 102 102 98 100  CO2 21 22 26  23  27  GLUCOSE 85 105* 118* 126* 96  BUN 42* 38* 36* 35* 34*  CREATININE 1.75* 1.39* 1.41* 1.53* 1.68*  CALCIUM 9.3 9.1 8.9 9.1 8.7  MG -- -- 1.7 1.9 2.0  PHOS -- -- 2.1* 2.7 2.6     CBC   Lab 06/10/12 0530 06/09/12 0429 06/08/12 0400  HGB 13.7 14.9 12.8  HCT 39.8 41.6 36.9  WBC 8.2 10.3 12.0*  PLT 99* 90* 63*   Anti-Coagulation  Lab 06/08/12 0400  INR 1.25      Discharge Orders    Future Appointments: Provider: Department: Dept Phone: Center:   07/12/2012 10:00 AM Hillis Range, MD [facility] [phone] [facility]     Future Orders Please Complete By Expires   Diet Carb Modified      Increase activity slowly      Call MD for:  temperature >100.4      Call MD for:  persistant nausea and vomiting      Call MD for:  difficulty breathing, headache or visual disturbances      Call MD for:      Scheduling Instructions:   Suspected Melena / bloody stool       Follow-up Information    Follow up with Hillis Range, MD. On 07/12/2012. (Appt at 10 AM)    Contact information:   [address] [place] [address] [phone]       Follow up with [name], MD. On 06/23/2012. (Appt at 11 AM with BMP)    Contact information:   [address] [place] [address] [phone]             Medication List     As of 06/14/2012 12:12 PM    START taking these medications         arformoterol 15 MCG/2ML Nebu   Commonly known as: BROVANA   Take 2 mLs (15 mcg total) by nebulization 2 (two) times daily.      bisoprolol 5 MG tablet   Commonly known as: ZEBETA   Take 1 tablet (5 mg total) by mouth daily.      budesonide 0.5 MG/2ML nebulizer solution   Commonly known as: PULMICORT   Take 2 mLs (0.5 mg total) by nebulization 2 (two) times daily.      * insulin aspart 100 UNIT/ML injection   Commonly known as: novoLOG   Inject 0-15 Units into the skin 3 (three) times daily with meals.      * insulin aspart 100 UNIT/ML injection   Commonly known as: novoLOG   Inject 0-5 Units into the skin at bedtime.      pantoprazole 40 MG tablet   Commonly known as: PROTONIX   Take 1 tablet (40 mg total) by mouth daily.      spironolactone 25 MG tablet   Commonly known as: ALDACTONE   Take 1 tablet (25 mg total) by mouth daily.     * Notice: This list has 2 medication(s) that are the same as other medications prescribed for you. Read the directions carefully, and ask your doctor or other care provider to review them with you.    CONTINUE taking these medications         brinzolamide 1 % ophthalmic suspension   Commonly known as: AZOPT      COMBIGAN 0.2-0.5 % ophthalmic solution   Generic drug: brimonidine-timolol      escitalopram 5 MG tablet   Commonly known as: LEXAPRO  ezetimibe 10 MG tablet   Commonly known as: ZETIA      levETIRAcetam 250 MG tablet   Commonly known as: KEPPRA      predniSONE 10 MG tablet   Commonly known as: DELTASONE      STOP taking these medications         Calcium 500/D 500-125 MG-UNIT Tabs   Generic drug: Calcium Carbonate-Vitamin D      ciprofloxacin 500 MG tablet   Commonly known as: CIPRO      diclofenac sodium 1 % Gel   Commonly known as: VOLTAREN       traMADol 50 MG tablet   Commonly known as: ULTRAM          Where to get your medications       Information on where to get these meds is not yet available. Ask your nurse or doctor.         arformoterol 15 MCG/2ML Nebu   bisoprolol 5 MG tablet   budesonide 0.5 MG/2ML nebulizer solution   insulin aspart 100 UNIT/ML injection   pantoprazole 40 MG tablet   spironolactone 25 MG tablet              Disposition: 01-Home or Self Care  Discharged Condition: [name] has met maximum benefit of inpatient care and is medically stable and cleared for discharge.  Patient is pending follow up as above.      Time spent on disposition:  Greater than 35 minutes.       Signed: Canary Brim, NP-C [facility] Pgr: [phone]    STAFF NOTE I personally evaluated patient on day of discharge. D/w Dr Johney Frame cards. Ok to continue aldctone but monitor renal function closely in SNF.    DR Lavinia Sharps

## 2012-06-14 NOTE — Progress Notes (Signed)
Clinical Social Worker spoke with pt's son, [name], and reviewed additional bed offers.  Per Havery Moros is requesting 1,000$ out of pocket.  [name] to call [address], [place], and [place].  [name] to phone CSW back to discuss options.   Angelia Mould, MSW, [place] [phone]

## 2012-06-14 NOTE — Progress Notes (Signed)
Pt discharged to [facility] per MD order. Pt and family received and reviewed all discharge instructions and medication information including follow-up appointments and prescriptions.  Pt home medication returned to pt prior to discharge.  Pt alert and oriented at discharge with no complaints of pain.  Pt transported to facility via stretcher by [facility]. Report called to [facility] prior to pt discharge. Efraim Kaufmann

## 2012-06-15 ENCOUNTER — Non-Acute Institutional Stay: Payer: Self-pay | Admitting: Family Medicine

## 2012-06-15 DIAGNOSIS — Z593 Problems related to living in residential institution: Secondary | ICD-10-CM | POA: Insufficient documentation

## 2012-06-15 NOTE — Progress Notes (Signed)
Subjective:     Patient ID: [name], female   DOB: Jan 08, 1931, 78 y.o.   MRN: [id number]  HPI Comments: 78 y.o. F presents to [facility] after admission to [facility] from 10/20-10/30/13 for cardiac arrest.  Patient also has PMH significant for HTN, CAD, ?a fib, temporal arteritis on chronic prednisone, MGUS, CKD stage 3, and anemia.  Patient initially presented in cardiac arrest of unknown etiology, but consensus was that it may have been secondary to a metabolic cause from renal failure and hyperkalemia.   History was difficult to obtain as patient was very soft spoken and tired.   Prior to hospital admission, patient was living at home by herself performing most ADL's on her own.  She did have help from her son and other children who live near by with shopping, cooking, and cleaning.  She was able to bathe herself, dress herself, feed herself, and go to the bathroom on her own- she denies issues with incontinence at baseline.  She has not driven in many years.   Patient states her plan is to get stronger and then go back home.  She previously was a Sport and exercise psychologist.  She has 7 children, all of whom live in the general area. She denies tobacco, EtOH, and drug use.   Her only complaint currently is that she feels weak all over.  She also complains of her chest being sore (likely from chest compressions)    Review of Systems  Respiratory: Negative for cough and shortness of breath.   Cardiovascular: Positive for chest pain. Negative for leg swelling.  Gastrointestinal: Negative for nausea, vomiting and abdominal pain.  Genitourinary: Negative for dysuria, hematuria and difficulty urinating.  Musculoskeletal: Positive for arthralgias.  Neurological: Positive for weakness. Negative for dizziness.  Psychiatric/Behavioral: Negative for confusion.       Objective:   Physical Exam  Constitutional: She is oriented to person, place, and time. She appears well-developed. She is cooperative. She  does not appear ill. No distress.       Appears frail, very weak/soft voice, eyes closed during most of the HPI and exam  HENT:  Head: Normocephalic and atraumatic.  Nose: Nose normal.  Mouth/Throat: Oropharynx is clear and moist and mucous membranes are normal. She has dentures. No oropharyngeal exudate, posterior oropharyngeal edema or posterior oropharyngeal erythema.       Wears dentures on top, not in place currently. Some teeth missing on bottom.   Eyes: Conjunctivae normal and EOM are normal. Pupils are equal, round, and reactive to light.  Neck: Neck supple.  Cardiovascular: Normal rate, regular rhythm, normal heart sounds and intact distal pulses.   No murmur heard.      Significant chest wall tenderness, no bruising or deformity noted.   Pulmonary/Chest: Breath sounds normal. No respiratory distress. She has no decreased breath sounds. She has no wheezes.       Decreased effort due to pain with deep breaths and movement. Lungs sound clear although not all lung fields were heard due to patient discomfort.   Abdominal: Soft. Normal appearance and bowel sounds are normal. She exhibits no distension. There is tenderness. There is no rebound.       Some pain with palpation of upper abd, but pt reports the pain is actually in her ribs not her abdomen   Genitourinary:       Adult diaper in place  Musculoskeletal: Normal range of motion. She exhibits no edema.       Moves all extremities; poor  effort during strength testing but appears to be 4-5/5 in ext x4.   Lymphadenopathy:    She has no cervical adenopathy.  Neurological: She is alert and oriented to person, place, and time. She has normal strength and normal reflexes. No cranial nerve deficit or sensory deficit.  Skin: Skin is warm, dry and intact. No rash noted.       Limited exam due to pain with movement, will need better back/buttock exam       Assessment/Plan:     78 yo F with multiple medical problems presents to SNF after  cardiac arrest and subsequent hospitalization -Need repeat BMET on 06/16/12 to continue close evaluation of electrolytes and Cr -Will investigate to find patient's baseline Cr (?appears to be between 1.5 and 1.7) -Will review medications and add scheduled Tylenol due to significant chest discomfort s/o cardiac arrest (normal cardiac workup, this is MSK pain not true chest/cardiac pain) -Needs better examination of back/buttock area when pain is under better control -Appears to be A&Ox3, will do MMSE next week once acclimated to SNF -Plans on short term rehab, so will have PT/OT to help strengthen patient

## 2012-06-16 ENCOUNTER — Non-Acute Institutional Stay: Payer: Self-pay | Admitting: Family Medicine

## 2012-06-16 DIAGNOSIS — J9600 Acute respiratory failure, unspecified whether with hypoxia or hypercapnia: Secondary | ICD-10-CM

## 2012-06-16 DIAGNOSIS — Z87898 Personal history of other specified conditions: Secondary | ICD-10-CM

## 2012-06-16 DIAGNOSIS — M316 Other giant cell arteritis: Secondary | ICD-10-CM

## 2012-06-16 DIAGNOSIS — K8020 Calculus of gallbladder without cholecystitis without obstruction: Secondary | ICD-10-CM

## 2012-06-16 DIAGNOSIS — H543 Unqualified visual loss, both eyes: Secondary | ICD-10-CM

## 2012-06-16 DIAGNOSIS — R410 Disorientation, unspecified: Secondary | ICD-10-CM

## 2012-06-16 MED ORDER — ACETAMINOPHEN ER 650 MG PO TBCR: 650.0000 mg | EXTENDED_RELEASE_TABLET | Freq: Three times a day (TID) | ORAL | Status: DC

## 2012-06-19 ENCOUNTER — Encounter: Payer: Self-pay | Admitting: Family Medicine

## 2012-06-19 DIAGNOSIS — F0390 Unspecified dementia without behavioral disturbance: Secondary | ICD-10-CM

## 2012-06-19 DIAGNOSIS — H543 Unqualified visual loss, both eyes: Secondary | ICD-10-CM | POA: Insufficient documentation

## 2012-06-19 HISTORY — DX: Unspecified dementia, unspecified severity, without behavioral disturbance, psychotic disturbance, mood disturbance, and anxiety: F03.90

## 2012-06-19 NOTE — Assessment & Plan Note (Signed)
Need to determine if cataracts have been removed

## 2012-06-19 NOTE — Progress Notes (Signed)
Patient ID: [name], female   DOB: [birth year], 78 y.o.   MRN: [id number] I interviewed and examined this patient and discussed the treatment plan with Dr Fara Boros.

## 2012-06-19 NOTE — Assessment & Plan Note (Signed)
Check sed rate and consider decreasing prednisone

## 2012-06-19 NOTE — Assessment & Plan Note (Signed)
Improved, consider need for chronic COPD medications

## 2012-06-19 NOTE — Assessment & Plan Note (Signed)
Continue Keppra.

## 2012-06-19 NOTE — Assessment & Plan Note (Signed)
Repeat BMET next week

## 2012-06-19 NOTE — Assessment & Plan Note (Signed)
Asymptomatic per surgery consultation 05/2012

## 2012-06-26 ENCOUNTER — Encounter: Payer: Self-pay | Admitting: Family Medicine

## 2012-06-26 LAB — CBC AND DIFFERENTIAL
HCT: 40 % (ref 36–46)
Platelets: 219 10*3/uL (ref 150–399)

## 2012-06-26 LAB — BASIC METABOLIC PANEL
BUN: 55 mg/dL — AB (ref 4–21)
Sodium: 135 mmol/L — AB (ref 137–147)

## 2012-06-26 LAB — HEMOGLOBIN A1C: Hgb A1c MFr Bld: 6.4 % — AB (ref 4.0–6.0)

## 2012-06-28 ENCOUNTER — Non-Acute Institutional Stay: Payer: Self-pay | Admitting: Family Medicine

## 2012-06-29 MED ORDER — VITAMIN D3 1.25 MG (50000 UT) PO CAPS: 50000.0000 [IU] | ORAL_CAPSULE | ORAL | Status: DC

## 2012-06-29 MED ORDER — CALCIUM CITRATE 250 MG PO TABS: 1000.0000 mg | ORAL_TABLET | Freq: Every day | ORAL | Status: DC

## 2012-07-05 ENCOUNTER — Non-Acute Institutional Stay: Payer: Self-pay | Admitting: Family Medicine

## 2012-07-05 DIAGNOSIS — I10 Essential (primary) hypertension: Secondary | ICD-10-CM

## 2012-07-05 DIAGNOSIS — M316 Other giant cell arteritis: Secondary | ICD-10-CM

## 2012-07-05 DIAGNOSIS — R410 Disorientation, unspecified: Secondary | ICD-10-CM

## 2012-07-05 NOTE — Assessment & Plan Note (Signed)
Dr Concepcion Elk may want to adjust her Prednisone if ESR still high further away from her hospitalization

## 2012-07-05 NOTE — Progress Notes (Unsigned)
  Subjective:    Patient ID: [name], female    DOB: 1931-07-08, 78 y.o.   MRN: [id number]  HPI Says she's going home Friday. Lives in her own apartment on [address], but son and daughter both live close and look in on her. Spoke with son, [name], later and he says his sister will stay with her. They just want reassurance that she is medically stable. I told him that she still has significant memory problems that may not get better.    Review of Systems     Objective:   Physical Exam Alert, subdued, but appropriate responses Neuro - Registers 3 words, recalls 1 Only oriented to month as "the one with Thanksgiving" Chest clear Heart RR        Assessment & Plan:

## 2012-07-05 NOTE — Assessment & Plan Note (Signed)
she still has significant memory problems that may not get better.

## 2012-07-05 NOTE — Assessment & Plan Note (Signed)
well controlled  

## 2012-07-07 ENCOUNTER — Non-Acute Institutional Stay: Payer: Self-pay | Admitting: Family Medicine

## 2012-07-07 ENCOUNTER — Other Ambulatory Visit: Payer: Self-pay | Admitting: Family Medicine

## 2012-07-07 DIAGNOSIS — M316 Other giant cell arteritis: Secondary | ICD-10-CM

## 2012-07-07 DIAGNOSIS — F0390 Unspecified dementia without behavioral disturbance: Secondary | ICD-10-CM

## 2012-07-07 DIAGNOSIS — F329 Major depressive disorder, single episode, unspecified: Secondary | ICD-10-CM | POA: Insufficient documentation

## 2012-07-07 MED ORDER — BRIMONIDINE TARTRATE-TIMOLOL 0.2-0.5 % OP SOLN: 1.0000 [drp] | Freq: Two times a day (BID) | OPHTHALMIC | Status: DC

## 2012-07-07 MED ORDER — BRINZOLAMIDE 1 % OP SUSP: 1.0000 [drp] | Freq: Three times a day (TID) | OPHTHALMIC | Status: DC

## 2012-07-07 MED ORDER — VITAMIN D3 1.25 MG (50000 UT) PO CAPS: 50000.0000 [IU] | ORAL_CAPSULE | ORAL | Status: DC

## 2012-07-07 MED ORDER — SPIRONOLACTONE 25 MG PO TABS: 25.0000 mg | ORAL_TABLET | Freq: Every day | ORAL | Status: DC

## 2012-07-07 MED ORDER — PANTOPRAZOLE SODIUM 40 MG PO TBEC: 40.0000 mg | DELAYED_RELEASE_TABLET | Freq: Every day | ORAL | Status: DC

## 2012-07-07 MED ORDER — INSULIN ASPART 100 UNIT/ML ~~LOC~~ SOLN: 0.0000 [IU] | Freq: Three times a day (TID) | SUBCUTANEOUS | Status: DC

## 2012-07-07 MED ORDER — BUDESONIDE 0.5 MG/2ML IN SUSP: 0.5000 mg | Freq: Two times a day (BID) | RESPIRATORY_TRACT | Status: DC

## 2012-07-07 MED ORDER — PREDNISONE 10 MG PO TABS: 10.0000 mg | ORAL_TABLET | Freq: Every day | ORAL | Status: DC

## 2012-07-07 MED ORDER — LEVETIRACETAM 250 MG PO TABS: 250.0000 mg | ORAL_TABLET | Freq: Two times a day (BID) | ORAL | Status: DC

## 2012-07-07 MED ORDER — BISOPROLOL FUMARATE 5 MG PO TABS: 5.0000 mg | ORAL_TABLET | Freq: Every day | ORAL | Status: AC

## 2012-07-07 MED ORDER — ESCITALOPRAM OXALATE 5 MG PO TABS: 2.5000 mg | ORAL_TABLET | Freq: Every day | ORAL | Status: DC

## 2012-07-07 MED ORDER — EZETIMIBE 10 MG PO TABS: 10.0000 mg | ORAL_TABLET | Freq: Every day | ORAL | Status: AC

## 2012-07-07 MED ORDER — ACETAMINOPHEN ER 650 MG PO TBCR: 650.0000 mg | EXTENDED_RELEASE_TABLET | Freq: Three times a day (TID) | ORAL | Status: DC

## 2012-07-07 MED ORDER — ARFORMOTEROL TARTRATE 15 MCG/2ML IN NEBU: 15.0000 ug | INHALATION_SOLUTION | Freq: Two times a day (BID) | RESPIRATORY_TRACT | Status: DC

## 2012-07-07 MED ORDER — CALCIUM CITRATE 250 MG PO TABS: 1000.0000 mg | ORAL_TABLET | Freq: Every day | ORAL | Status: DC

## 2012-07-07 NOTE — Progress Notes (Signed)
Family Medicine Teaching Service Nursing Home Discharge Summary  Patient name: [name] Medical record number: [id number] Date of birth: 1931-07-03 Age: 78 y.o. Gender: female Date of Admission: 06/14/2012  Date of Discharge: 07/10/2012  Primary Care Provider: [name], MD  Indication for SNF: Deconditioning secondary to hospitalization for MI Discharge Diagnoses:  Patient Active Problem List  Diagnosis  . Hydronephrosis of right kidney  . Cholelithiasis  . Monoclonal gammopathy  . History of gout  . Glaucoma  . Cataracts, bilateral  . CKD (chronic kidney disease), stage III  . Hyperlipidemia  . Temporal arteritis  . H/O: gout  . H/O idiopathic seizure  . Atrial fibrillation  . NSTEMI (non-ST elevated myocardial infarction)  . Hypertension  . Person living in residential institution  . Dementia  . Visual impairment in both eyes  . Depression   Significant Labs and Imaging:  Lab Results  Component Value Date   ESRSEDRATE 67 06/26/2012   Brief SNF Course: Patient was hospitalized due to MI with two separate cardiac arrests.  She was admitted to SNF for rehab due to significant deconditioning.  1. Deconditioning: Patient progressed well with physical and occupational therapy and was discharged from their services.  She was sent home with HHPT/OT/RN.  2.  Temporal Arteritis: Patient known to have temporal arteritis and is on 10mg  of prednisone daily.  ESR was 67 during her nursing home stay.  3. Depression: Patient had problems with depression and was started on Lexapro.  Her GDS score was 10 at the time of discharge.  4. Dementia: On admission patient had MMSE of 12/25 (blind so unable to perform any tests requiring vision).  This improved to 16/25 at the time of discharge.  Notably the patient had 0/3 on recall.  Discharge Medications:  Medication Sig  . acetaminophen (TYLENOL 8 HOUR) 650 MG CR tablet Take 1 tablet (650 mg total) by mouth every 8 (eight)  hours.  Marland Kitchen arformoterol (BROVANA) 15 MCG/2ML NEBU Take 2 mLs (15 mcg total) by nebulization 2 (two) times daily.  . bisoprolol (ZEBETA) 5 MG tablet Take 1 tablet (5 mg total) by mouth daily.  . brimonidine-timolol (COMBIGAN) 0.2-0.5 % ophthalmic solution Place 1 drop into the left eye every 12 (twelve) hours.  . brinzolamide (AZOPT) 1 % ophthalmic suspension Place 1 drop into the right eye 3 (three) times daily.  . budesonide (PULMICORT) 0.5 MG/2ML nebulizer solution Take 2 mLs (0.5 mg total) by nebulization 2 (two) times daily.  . Calcium Citrate 250 MG TABS Take 4 tablets (1,000 mg total) by mouth daily.  . Cholecalciferol (VITAMIN D3) 50000 UNITS CAPS Take 50,000 Units by mouth once a week.  . escitalopram (LEXAPRO) 5 MG tablet Take 0.5 tablets (2.5 mg total) by mouth daily.  Marland Kitchen ezetimibe (ZETIA) 10 MG tablet Take 1 tablet (10 mg total) by mouth daily.  . insulin aspart (NOVOLOG) 100 UNIT/ML injection Inject 0-15 Units into the skin 3 (three) times daily with meals.  . levETIRAcetam (KEPPRA) 250 MG tablet Take 1 tablet (250 mg total) by mouth every 12 (twelve) hours.  . pantoprazole (PROTONIX) 40 MG tablet Take 1 tablet (40 mg total) by mouth daily.  . predniSONE (DELTASONE) 10 MG tablet Take 1 tablet (10 mg total) by mouth daily.  Marland Kitchen spironolactone (ALDACTONE) 25 MG tablet Take 1 tablet (25 mg total) by mouth daily.   Issues for Follow Up:  1. Depression: Patient may benefit from increase in Lexapro dose from 2.5mg  to 5mg . 2. Dementia:  As patient's mental state continues to clear, she may benefit from starting donepezil or similar medications.  Will defer this to her PCP 3. Medications: Patient was discharged with a 30 day supply of medications sent, electronically, to her pharmacy.  She will need to see her PCP within that time frame. 4. Temporal arteritis: Given continued elevation of patient's ESR, she may benefit from higher dose of steroids, assuming her ESR remains elevated in 1-2  months.  Nghia Mcentee, MD 07/07/2012, 3:53 PM    I interviewed and examined this patient at [facility] and discussed the discharge plan with Dr Louanne Belton.

## 2012-07-07 NOTE — Telephone Encounter (Signed)
Spoke with son who confirmed drug store and had no questions.

## 2012-07-10 ENCOUNTER — Encounter: Payer: Self-pay | Admitting: Pharmacist

## 2012-07-10 DIAGNOSIS — F329 Major depressive disorder, single episode, unspecified: Secondary | ICD-10-CM

## 2012-07-10 DIAGNOSIS — K219 Gastro-esophageal reflux disease without esophagitis: Secondary | ICD-10-CM | POA: Insufficient documentation

## 2012-07-11 ENCOUNTER — Telehealth: Payer: Self-pay | Admitting: Internal Medicine

## 2012-07-11 ENCOUNTER — Encounter: Payer: Self-pay | Admitting: *Deleted

## 2012-07-11 NOTE — Telephone Encounter (Signed)
[name] is very confused about pt meds and they did not get all the Rx from discharge from hospital

## 2012-07-12 ENCOUNTER — Encounter: Payer: Self-pay | Admitting: Internal Medicine

## 2012-07-12 ENCOUNTER — Ambulatory Visit (INDEPENDENT_AMBULATORY_CARE_PROVIDER_SITE_OTHER): Payer: Medicare Other | Admitting: Internal Medicine

## 2012-07-12 VITALS — BP 98/69 | HR 96 | Ht 62.0 in | Wt 122.0 lb

## 2012-07-12 DIAGNOSIS — I214 Non-ST elevation (NSTEMI) myocardial infarction: Secondary | ICD-10-CM

## 2012-07-12 DIAGNOSIS — I4891 Unspecified atrial fibrillation: Secondary | ICD-10-CM

## 2012-07-12 DIAGNOSIS — I10 Essential (primary) hypertension: Secondary | ICD-10-CM

## 2012-07-12 LAB — CBC WITH DIFFERENTIAL/PLATELET
Basophils Absolute: 0 10*3/uL (ref 0.0–0.1)
Eosinophils Absolute: 0 10*3/uL (ref 0.0–0.7)
Hemoglobin: 14.5 g/dL (ref 12.0–15.0)
Lymphocytes Relative: 17 % (ref 12.0–46.0)
MCHC: 32.7 g/dL (ref 30.0–36.0)
Monocytes Relative: 3.8 % (ref 3.0–12.0)
Neutro Abs: 10 10*3/uL — ABNORMAL HIGH (ref 1.4–7.7)
Neutrophils Relative %: 78.8 % — ABNORMAL HIGH (ref 43.0–77.0)
RBC: 4.6 Mil/uL (ref 3.87–5.11)
RDW: 19.4 % — ABNORMAL HIGH (ref 11.5–14.6)

## 2012-07-12 LAB — BASIC METABOLIC PANEL
CO2: 15 mEq/L — ABNORMAL LOW (ref 19–32)
Calcium: 9.7 mg/dL (ref 8.4–10.5)
Creatinine, Ser: 2.3 mg/dL — ABNORMAL HIGH (ref 0.4–1.2)
GFR: 26.39 mL/min — ABNORMAL LOW (ref >60.00)
Glucose, Bld: 241 mg/dL — ABNORMAL HIGH (ref 70–99)
Sodium: 132 mEq/L — ABNORMAL LOW (ref 135–145)

## 2012-07-12 NOTE — Telephone Encounter (Signed)
Saw Dr Johney Frame on 07/12/12

## 2012-07-12 NOTE — Patient Instructions (Addendum)
Your physician recommends that you schedule a follow-up appointment in: as need with Dr Johney Frame and 6 weeks with Tereso Newcomer, PA  Your physician recommends that you return for lab work today

## 2012-07-14 ENCOUNTER — Telehealth: Payer: Self-pay | Admitting: Internal Medicine

## 2012-07-14 NOTE — Telephone Encounter (Signed)
New Problem: ° ° ° °Patient's son returned your call.  Please call back. °

## 2012-07-14 NOTE — Telephone Encounter (Signed)
Son advised for pt to not take Aldactone until he hears back from Dr. Johney Frame next week.

## 2012-07-17 ENCOUNTER — Telehealth: Payer: Self-pay | Admitting: Internal Medicine

## 2012-07-17 DIAGNOSIS — E875 Hyperkalemia: Secondary | ICD-10-CM

## 2012-07-17 NOTE — Telephone Encounter (Signed)
Pt advised on Friday to hold aldactone per Dr. [place] for a high k+.  Dr Johney Frame should address on Wednesday when in office.

## 2012-07-17 NOTE — Telephone Encounter (Signed)
plz son [name] [phone] returing nurse call  regarding pt lab work.

## 2012-07-18 NOTE — Telephone Encounter (Signed)
Patient coming in Thurs to repeat blood work

## 2012-07-18 NOTE — Addendum Note (Signed)
Addended by: Dennis Bast F on: 07/18/2012 05:14 PM   Modules accepted: Orders

## 2012-07-20 ENCOUNTER — Other Ambulatory Visit (INDEPENDENT_AMBULATORY_CARE_PROVIDER_SITE_OTHER): Payer: Medicare Other

## 2012-07-20 DIAGNOSIS — E875 Hyperkalemia: Secondary | ICD-10-CM

## 2012-07-20 LAB — BASIC METABOLIC PANEL
CO2: 19 mEq/L (ref 19–32)
Chloride: 107 mEq/L (ref 96–112)
Creatinine, Ser: 1.5 mg/dL — ABNORMAL HIGH (ref 0.4–1.2)
Potassium: 4.6 mEq/L (ref 3.5–5.1)

## 2012-07-23 NOTE — Progress Notes (Signed)
PCP: Carrie German, MD  [name] is a 78 y.o. female who presents today for routine cardiology followup after her recent hospitalization. She is making slow recovery and has recently been discharged from SNF.  Today, she denies symptoms of palpitations, chest pain, shortness of breath,  lower extremity edema, dizziness, presyncope, or syncope.  The patient is otherwise without complaint today.   Past Medical History  Diagnosis Date  . Arthritis   . Hypertension   . Coronary artery disease     a. Neg stress test 2008. b. ?questionable history - pt thought she had heart cath in 1970's but details were unclear.  . Stroke     a. Details unclear.  . Seizures     a. Due to hyponatremia, resolved.  Marland Kitchen History of gout   . Glaucoma   . Cataracts, bilateral   . Acute kidney injury   . CKD (chronic kidney disease), stage III   . Hyperlipidemia   . Gallstones   . Cerebrovascular disease   . Hyponatremia     a. H/o 2011 ?SIADH with seizures, encephalopathy at that time.  . Temporal arteritis   . Cardiac arrest 05/2012    x 2, thought due to renal failure and hypoK  . Dementia 06/19/2012   Past Surgical History  Procedure Date  . Eye surgery   . Abdominal hysterectomy   . Coronary angioplasty with stent placement     Current Outpatient Prescriptions  Medication Sig Dispense Refill  . brimonidine-timolol (COMBIGAN) 0.2-0.5 % ophthalmic solution Place 1 drop into both eyes every 12 (twelve) hours.      . brinzolamide (AZOPT) 1 % ophthalmic suspension Place 1 drop into both eyes daily.      Marland Kitchen escitalopram (LEXAPRO) 5 MG tablet Take 5 mg by mouth daily.      Marland Kitchen ezetimibe (ZETIA) 10 MG tablet Take 1 tablet (10 mg total) by mouth daily.  30 tablet  0  . levETIRAcetam (KEPPRA) 250 MG tablet Take 250 mg by mouth daily.      . predniSONE (DELTASONE) 10 MG tablet Take 1 tablet (10 mg total) by mouth daily.  30 tablet  0  . acetaminophen (TYLENOL 8 HOUR) 650 MG CR tablet Take 1 tablet (650  mg total) by mouth every 8 (eight) hours.      Marland Kitchen arformoterol (BROVANA) 15 MCG/2ML NEBU Take 2 mLs (15 mcg total) by nebulization 2 (two) times daily.  120 mL    . bisoprolol (ZEBETA) 5 MG tablet Take 1 tablet (5 mg total) by mouth daily.      . budesonide (PULMICORT) 0.5 MG/2ML nebulizer solution Take 2 mLs (0.5 mg total) by nebulization 2 (two) times daily.      . Calcium Citrate 250 MG TABS Take 4 tablets (1,000 mg total) by mouth daily.    0  . Cholecalciferol (VITAMIN D3) 50000 UNITS CAPS Take 50,000 Units by mouth once a week.      . insulin aspart (NOVOLOG) 100 UNIT/ML injection Inject 0-15 Units into the skin 3 (three) times daily with meals.  1 vial    . mirtazapine (REMERON) 7.5 MG tablet Take 1 tablet (7.5 mg total) by mouth at bedtime.      Marland Kitchen omeprazole (PRILOSEC) 20 MG capsule Take 2 capsules (40 mg total) by mouth daily.      . Phenol (CHLORASEPTIC MOUTH PAIN MT) Use as directed 1 spray in the mouth or throat as needed.      Marland Kitchen spironolactone (ALDACTONE)  25 MG tablet Take 1 tablet (25 mg total) by mouth daily.        Physical Exam: Filed Vitals:   07/12/12 1034  BP: 98/69  Pulse: 96  Height: 5\' 2"  (1.575 m)  Weight: 122 lb (55.339 kg)    GEN- The patient is elderly appearing, alert and oriented x 3 today.   Head- normocephalic, atraumatic Eyes-  Sclera clear, conjunctiva pink Ears- hearing intact Oropharynx- clear Lungs- Clear to ausculation bilaterally, normal work of breathing Heart- Regular rate and rhythm, no murmurs, rubs or gallops, PMI not laterally displaced GI- soft, NT, ND, + BS Extremities- no clubbing, cyanosis, or edema  ekg today reveals sinus rhythm, LVH  Assessment and Plan:

## 2012-07-23 NOTE — Assessment & Plan Note (Signed)
Much improved BMET today

## 2012-07-23 NOTE — Assessment & Plan Note (Signed)
Repeat Bmet and CBC today

## 2012-07-23 NOTE — Assessment & Plan Note (Signed)
Maintaining sinus rhythm She is presently a poor candidate for anticoagulation due to recent GI bleeding If she has significant clinical improvement, she will require anticoagulation long term

## 2012-07-23 NOTE — Assessment & Plan Note (Signed)
Due to acute medical illness Given her renal failure, advanced age, and absence of ischemic symptoms, would manage conservatively No changes today

## 2012-08-12 ENCOUNTER — Other Ambulatory Visit: Payer: Self-pay | Admitting: Family Medicine

## 2012-08-14 ENCOUNTER — Emergency Department (HOSPITAL_COMMUNITY): Payer: Medicare Other

## 2012-08-14 ENCOUNTER — Inpatient Hospital Stay (HOSPITAL_COMMUNITY)
Admission: EM | Admit: 2012-08-14 | Discharge: 2012-08-19 | DRG: 194 | Disposition: A | Discharge status: Home Health | Payer: Medicare Other | Attending: Internal Medicine | Admitting: Internal Medicine

## 2012-08-14 ENCOUNTER — Encounter (HOSPITAL_COMMUNITY): Payer: Self-pay | Admitting: Cardiology

## 2012-08-14 DIAGNOSIS — N184 Chronic kidney disease, stage 4 (severe): Secondary | ICD-10-CM | POA: Diagnosis present

## 2012-08-14 DIAGNOSIS — I252 Old myocardial infarction: Secondary | ICD-10-CM

## 2012-08-14 DIAGNOSIS — Z9861 Coronary angioplasty status: Secondary | ICD-10-CM

## 2012-08-14 DIAGNOSIS — N1330 Unspecified hydronephrosis: Secondary | ICD-10-CM

## 2012-08-14 DIAGNOSIS — I10 Essential (primary) hypertension: Secondary | ICD-10-CM

## 2012-08-14 DIAGNOSIS — Z8673 Personal history of transient ischemic attack (TIA), and cerebral infarction without residual deficits: Secondary | ICD-10-CM

## 2012-08-14 DIAGNOSIS — Z87898 Personal history of other specified conditions: Secondary | ICD-10-CM

## 2012-08-14 DIAGNOSIS — R109 Unspecified abdominal pain: Secondary | ICD-10-CM

## 2012-08-14 DIAGNOSIS — H543 Unqualified visual loss, both eyes: Secondary | ICD-10-CM

## 2012-08-14 DIAGNOSIS — N189 Chronic kidney disease, unspecified: Secondary | ICD-10-CM

## 2012-08-14 DIAGNOSIS — E785 Hyperlipidemia, unspecified: Secondary | ICD-10-CM

## 2012-08-14 DIAGNOSIS — Z8739 Personal history of other diseases of the musculoskeletal system and connective tissue: Secondary | ICD-10-CM

## 2012-08-14 DIAGNOSIS — N179 Acute kidney failure, unspecified: Secondary | ICD-10-CM

## 2012-08-14 DIAGNOSIS — K219 Gastro-esophageal reflux disease without esophagitis: Secondary | ICD-10-CM

## 2012-08-14 DIAGNOSIS — F32A Depression, unspecified: Secondary | ICD-10-CM

## 2012-08-14 DIAGNOSIS — F3289 Other specified depressive episodes: Secondary | ICD-10-CM | POA: Diagnosis present

## 2012-08-14 DIAGNOSIS — E440 Moderate protein-calorie malnutrition: Secondary | ICD-10-CM | POA: Diagnosis present

## 2012-08-14 DIAGNOSIS — M129 Arthropathy, unspecified: Secondary | ICD-10-CM | POA: Diagnosis present

## 2012-08-14 DIAGNOSIS — I214 Non-ST elevation (NSTEMI) myocardial infarction: Secondary | ICD-10-CM

## 2012-08-14 DIAGNOSIS — G40909 Epilepsy, unspecified, not intractable, without status epilepticus: Secondary | ICD-10-CM | POA: Diagnosis present

## 2012-08-14 DIAGNOSIS — I4891 Unspecified atrial fibrillation: Secondary | ICD-10-CM

## 2012-08-14 DIAGNOSIS — I824Y9 Acute embolism and thrombosis of unspecified deep veins of unspecified proximal lower extremity: Secondary | ICD-10-CM | POA: Diagnosis present

## 2012-08-14 DIAGNOSIS — J189 Pneumonia, unspecified organism: Secondary | ICD-10-CM

## 2012-08-14 DIAGNOSIS — Z87891 Personal history of nicotine dependence: Secondary | ICD-10-CM

## 2012-08-14 DIAGNOSIS — F329 Major depressive disorder, single episode, unspecified: Secondary | ICD-10-CM | POA: Diagnosis present

## 2012-08-14 DIAGNOSIS — B961 Klebsiella pneumoniae [K. pneumoniae] as the cause of diseases classified elsewhere: Secondary | ICD-10-CM | POA: Diagnosis present

## 2012-08-14 DIAGNOSIS — IMO0002 Reserved for concepts with insufficient information to code with codable children: Secondary | ICD-10-CM

## 2012-08-14 DIAGNOSIS — M316 Other giant cell arteritis: Secondary | ICD-10-CM

## 2012-08-14 DIAGNOSIS — H269 Unspecified cataract: Secondary | ICD-10-CM

## 2012-08-14 DIAGNOSIS — Z789 Other specified health status: Secondary | ICD-10-CM

## 2012-08-14 DIAGNOSIS — N390 Urinary tract infection, site not specified: Secondary | ICD-10-CM

## 2012-08-14 DIAGNOSIS — D472 Monoclonal gammopathy: Secondary | ICD-10-CM

## 2012-08-14 DIAGNOSIS — N183 Chronic kidney disease, stage 3 unspecified: Secondary | ICD-10-CM

## 2012-08-14 DIAGNOSIS — Z79899 Other long term (current) drug therapy: Secondary | ICD-10-CM

## 2012-08-14 DIAGNOSIS — Z593 Problems related to living in residential institution: Secondary | ICD-10-CM

## 2012-08-14 DIAGNOSIS — D72829 Elevated white blood cell count, unspecified: Secondary | ICD-10-CM | POA: Diagnosis present

## 2012-08-14 DIAGNOSIS — H409 Unspecified glaucoma: Secondary | ICD-10-CM

## 2012-08-14 DIAGNOSIS — E871 Hypo-osmolality and hyponatremia: Secondary | ICD-10-CM | POA: Diagnosis present

## 2012-08-14 DIAGNOSIS — I129 Hypertensive chronic kidney disease with stage 1 through stage 4 chronic kidney disease, or unspecified chronic kidney disease: Secondary | ICD-10-CM | POA: Diagnosis present

## 2012-08-14 DIAGNOSIS — F0390 Unspecified dementia without behavioral disturbance: Secondary | ICD-10-CM

## 2012-08-14 DIAGNOSIS — K5900 Constipation, unspecified: Secondary | ICD-10-CM | POA: Diagnosis present

## 2012-08-14 DIAGNOSIS — I2510 Atherosclerotic heart disease of native coronary artery without angina pectoris: Secondary | ICD-10-CM | POA: Diagnosis present

## 2012-08-14 DIAGNOSIS — K8020 Calculus of gallbladder without cholecystitis without obstruction: Secondary | ICD-10-CM

## 2012-08-14 LAB — HEPATIC FUNCTION PANEL
AST: 19 U/L (ref 0–37)
Albumin: 2.9 g/dL — ABNORMAL LOW (ref 3.5–5.2)
Total Protein: 8.2 g/dL (ref 6.0–8.3)

## 2012-08-14 LAB — URINALYSIS, ROUTINE W REFLEX MICROSCOPIC
Nitrite: NEGATIVE
Specific Gravity, Urine: 1.012 (ref 1.005–1.030)
pH: 5 (ref 5.0–8.0)

## 2012-08-14 LAB — URINE MICROSCOPIC-ADD ON

## 2012-08-14 LAB — BASIC METABOLIC PANEL
BUN: 23 mg/dL (ref 6–23)
Chloride: 96 mEq/L (ref 96–112)
GFR calc Af Amer: 35 mL/min — ABNORMAL LOW (ref >90)
Potassium: 4.6 mEq/L (ref 3.5–5.1)

## 2012-08-14 LAB — DIFFERENTIAL
Lymphocytes Relative: 32 % (ref 12–46)
Monocytes Absolute: 0.9 10*3/uL (ref 0.1–1.0)
Monocytes Relative: 8 % (ref 3–12)
Neutro Abs: 6.8 10*3/uL (ref 1.7–7.7)

## 2012-08-14 LAB — CBC
MCHC: 33.6 g/dL (ref 30.0–36.0)
RDW: 17.6 % — ABNORMAL HIGH (ref 11.5–15.5)

## 2012-08-14 MED ORDER — TIMOLOL MALEATE 0.5 % OP SOLN
1.0000 [drp] | Freq: Two times a day (BID) | OPHTHALMIC | Status: DC
  Administered 2012-08-14 – 2012-08-19 (×10): 1 [drp] via OPHTHALMIC
  Filled 2012-08-14 (×2): qty 5

## 2012-08-14 MED ORDER — PNEUMOCOCCAL VAC POLYVALENT 25 MCG/0.5ML IJ INJ
0.5000 mL | INJECTION | INTRAMUSCULAR | Status: AC
  Filled 2012-08-14: qty 0.5

## 2012-08-14 MED ORDER — BRINZOLAMIDE 1 % OP SUSP
1.0000 [drp] | Freq: Every day | OPHTHALMIC | Status: DC
  Administered 2012-08-15 – 2012-08-19 (×5): 1 [drp] via OPHTHALMIC
  Filled 2012-08-14: qty 10

## 2012-08-14 MED ORDER — SODIUM CHLORIDE 0.9 % IV BOLUS (SEPSIS)
500.0000 mL | Freq: Once | INTRAVENOUS | Status: AC
  Administered 2012-08-14: 500 mL via INTRAVENOUS

## 2012-08-14 MED ORDER — ACETAMINOPHEN 325 MG PO TABS
650.0000 mg | ORAL_TABLET | Freq: Four times a day (QID) | ORAL | Status: DC | PRN
  Administered 2012-08-15: 650 mg via ORAL
  Administered 2012-08-16: 325 mg via ORAL
  Administered 2012-08-17 – 2012-08-19 (×4): 650 mg via ORAL
  Filled 2012-08-14 (×6): qty 2

## 2012-08-14 MED ORDER — DEXTROSE 5 % IV SOLN
1.0000 g | Freq: Once | INTRAVENOUS | Status: AC
  Administered 2012-08-14: 1 g via INTRAVENOUS
  Filled 2012-08-14: qty 10

## 2012-08-14 MED ORDER — HEPARIN SODIUM (PORCINE) 5000 UNIT/ML IJ SOLN
5000.0000 [IU] | Freq: Three times a day (TID) | INTRAMUSCULAR | Status: DC
  Administered 2012-08-15: 5000 [IU] via SUBCUTANEOUS
  Filled 2012-08-14 (×4): qty 1

## 2012-08-14 MED ORDER — PANTOPRAZOLE SODIUM 40 MG PO TBEC
40.0000 mg | DELAYED_RELEASE_TABLET | Freq: Every day | ORAL | Status: DC
  Administered 2012-08-15 – 2012-08-19 (×5): 40 mg via ORAL
  Filled 2012-08-14 (×5): qty 1

## 2012-08-14 MED ORDER — BRIMONIDINE TARTRATE-TIMOLOL 0.2-0.5 % OP SOLN: 1.0000 [drp] | Freq: Two times a day (BID) | OPHTHALMIC | Status: DC

## 2012-08-14 MED ORDER — DEXTROSE 5 % IV SOLN
500.0000 mg | Freq: Once | INTRAVENOUS | Status: DC
  Filled 2012-08-14: qty 500

## 2012-08-14 MED ORDER — CEFTRIAXONE SODIUM 1 G IJ SOLR: 1.0000 g | INTRAMUSCULAR | Status: DC

## 2012-08-14 MED ORDER — SODIUM CHLORIDE 0.9 % IV SOLN: INTRAVENOUS | Status: AC

## 2012-08-14 MED ORDER — ESCITALOPRAM OXALATE 5 MG PO TABS
5.0000 mg | ORAL_TABLET | Freq: Every day | ORAL | Status: DC
  Administered 2012-08-15 – 2012-08-19 (×5): 5 mg via ORAL
  Filled 2012-08-14 (×5): qty 1

## 2012-08-14 MED ORDER — DEXTROSE 5 % IV SOLN: 500.0000 mg | INTRAVENOUS | Status: DC

## 2012-08-14 MED ORDER — BRIMONIDINE TARTRATE 0.2 % OP SOLN
1.0000 [drp] | Freq: Two times a day (BID) | OPHTHALMIC | Status: DC
  Administered 2012-08-14 – 2012-08-19 (×10): 1 [drp] via OPHTHALMIC
  Filled 2012-08-14 (×2): qty 5

## 2012-08-14 MED ORDER — LEVETIRACETAM 250 MG PO TABS
250.0000 mg | ORAL_TABLET | Freq: Two times a day (BID) | ORAL | Status: DC
  Administered 2012-08-14 – 2012-08-19 (×10): 250 mg via ORAL
  Filled 2012-08-14 (×11): qty 1

## 2012-08-14 MED ORDER — EZETIMIBE 10 MG PO TABS
10.0000 mg | ORAL_TABLET | Freq: Every day | ORAL | Status: DC
  Administered 2012-08-15 – 2012-08-19 (×5): 10 mg via ORAL
  Filled 2012-08-14 (×5): qty 1

## 2012-08-14 MED ORDER — SODIUM CHLORIDE 0.9 % IJ SOLN
3.0000 mL | Freq: Two times a day (BID) | INTRAMUSCULAR | Status: DC
  Administered 2012-08-17 – 2012-08-19 (×4): 3 mL via INTRAVENOUS

## 2012-08-14 MED ORDER — BISOPROLOL FUMARATE 5 MG PO TABS
5.0000 mg | ORAL_TABLET | Freq: Every day | ORAL | Status: DC
  Administered 2012-08-16 – 2012-08-19 (×4): 5 mg via ORAL
  Filled 2012-08-14 (×6): qty 1

## 2012-08-14 MED ORDER — SODIUM CHLORIDE 0.9 % IV SOLN
INTRAVENOUS | Status: DC
  Administered 2012-08-14: 75 mL/h via INTRAVENOUS

## 2012-08-14 MED ORDER — ACETAMINOPHEN 650 MG RE SUPP: 650.0000 mg | Freq: Four times a day (QID) | RECTAL | Status: DC | PRN

## 2012-08-14 MED ORDER — ONDANSETRON HCL 4 MG/2ML IJ SOLN: 4.0000 mg | Freq: Four times a day (QID) | INTRAMUSCULAR | Status: DC | PRN

## 2012-08-14 MED ORDER — PREDNISONE 5 MG PO TABS
5.0000 mg | ORAL_TABLET | Freq: Every day | ORAL | Status: DC
  Administered 2012-08-15 – 2012-08-19 (×5): 5 mg via ORAL
  Filled 2012-08-14 (×6): qty 1

## 2012-08-14 MED ORDER — ONDANSETRON HCL 4 MG PO TABS: 4.0000 mg | ORAL_TABLET | Freq: Four times a day (QID) | ORAL | Status: DC | PRN

## 2012-08-14 MED ORDER — DOCUSATE SODIUM 100 MG PO CAPS
100.0000 mg | ORAL_CAPSULE | Freq: Two times a day (BID) | ORAL | Status: DC
  Administered 2012-08-15 (×2): 100 mg via ORAL
  Filled 2012-08-14 (×4): qty 1

## 2012-08-14 NOTE — ED Notes (Signed)
Pt reports abd pain that radiates up into her chest. States she has been on a new type of medication for her stomach, but feel like it is not helping. Reports some nausea, vomiting x1. Denies any SOB.

## 2012-08-14 NOTE — ED Provider Notes (Addendum)
History     CSN: [id number]  Arrival date & time 08/14/12  1630   First MD Initiated Contact with Patient 08/14/12 1810      Chief Complaint  Patient presents with  . Chest Pain  . Abdominal Pain    (Consider location/radiation/quality/duration/timing/severity/associated sxs/prior treatment) Patient is a 78 y.o. female presenting with chest pain and abdominal pain. The history is provided by the patient and a caregiver.  Chest Pain The chest pain began 1 - 2 weeks ago. Chest pain occurs frequently. The chest pain is worsening. The pain is associated with coughing, breathing and eating. At its most intense, the pain is at 4/10. The pain is currently at 3/10. The severity of the pain is moderate. The quality of the pain is described as pleuritic and sharp. The pain radiates to the upper back. Primary symptoms include shortness of breath, cough, abdominal pain, nausea and vomiting. Pertinent negatives for primary symptoms include no fever, no wheezing and no palpitations. Primary symptoms comment: chills  Episode onset: over 1 week and worsening. The patient's medical history does not include CHF or COPD.  The cough began more than 1 week ago. The cough is new. The cough is non-productive.  Onset: 1-2 weeks. The abdominal pain has been gradually worsening since its onset. Pain Location: lower abd. The abdominal pain does not radiate. The abdominal pain is relieved by nothing.  The vomiting began yesterday. Vomiting occurred once. The emesis contains stomach contents.  Her past medical history is significant for CAD.    Abdominal Pain The primary symptoms of the illness include abdominal pain, shortness of breath, nausea and vomiting. The primary symptoms of the illness do not include fever. Primary symptoms comment: chills  The patient's medical history does not include CHF or COPD.    Past Medical History  Diagnosis Date  . Arthritis   . Hypertension   . Coronary artery disease    a. Neg stress test 2008. b. ?questionable history - pt thought she had heart cath in 1970's but details were unclear.  . Stroke     a. Details unclear.  . Seizures     a. Due to hyponatremia, resolved.  Marland Kitchen History of gout   . Glaucoma   . Cataracts, bilateral   . Acute kidney injury   . CKD (chronic kidney disease), stage III   . Hyperlipidemia   . Gallstones   . Cerebrovascular disease   . Hyponatremia     a. H/o 2011 ?SIADH with seizures, encephalopathy at that time.  . Temporal arteritis   . Cardiac arrest 05/2012    x 2, thought due to renal failure and hypoK  . Dementia 06/19/2012    Past Surgical History  Procedure Date  . Eye surgery   . Abdominal hysterectomy   . Coronary angioplasty with stent placement     Family History  Problem Relation Age of Onset  . Hypertension    . Diabetes type II    . Hypertension Mother   . Hypertension Father     History  Substance Use Topics  . Smoking status: Former Games developer  . Smokeless tobacco: Never Used  . Alcohol Use: No     Comment: used to drink beer heavily per old records.     OB History    Grav Para Term Preterm Abortions TAB SAB Ect Mult Living                  Review of Systems  Constitutional: Negative  for fever.  Respiratory: Positive for cough and shortness of breath. Negative for wheezing.   Cardiovascular: Positive for chest pain. Negative for palpitations.  Gastrointestinal: Positive for nausea, vomiting and abdominal pain.  All other systems reviewed and are negative.    Allergies  Lactose intolerance (gi)  Home Medications   Current Outpatient Rx  Name  Route  Sig  Dispense  Refill  . BISOPROLOL FUMARATE 5 MG PO TABS   Oral   Take 1 tablet (5 mg total) by mouth daily.         Marland Kitchen ESCITALOPRAM OXALATE 5 MG PO TABS   Oral   Take 5 mg by mouth daily.         Marland Kitchen EZETIMIBE 10 MG PO TABS   Oral   Take 1 tablet (10 mg total) by mouth daily.   30 tablet   0   . LEVETIRACETAM 250 MG PO  TABS   Oral   Take 250 mg by mouth 2 (two) times daily.          Marland Kitchen PANTOPRAZOLE SODIUM 40 MG PO TBEC   Oral   Take 40 mg by mouth daily.         Marland Kitchen PREDNISONE 10 MG PO TABS   Oral   Take 5 mg by mouth daily.         . ACETAMINOPHEN ER 650 MG PO TBCR   Oral   Take 1 tablet (650 mg total) by mouth every 8 (eight) hours.         . ARFORMOTEROL TARTRATE 15 MCG/2ML IN NEBU   Nebulization   Take 2 mLs (15 mcg total) by nebulization 2 (two) times daily.   120 mL      . BRIMONIDINE TARTRATE-TIMOLOL 0.2-0.5 % OP SOLN   Both Eyes   Place 1 drop into both eyes every 12 (twelve) hours.         Marland Kitchen BRINZOLAMIDE 1 % OP SUSP   Both Eyes   Place 1 drop into both eyes daily.         . BUDESONIDE 0.5 MG/2ML IN SUSP   Nebulization   Take 2 mLs (0.5 mg total) by nebulization 2 (two) times daily.         Marland Kitchen CALCIUM CITRATE 250 MG PO TABS   Oral   Take 4 tablets (1,000 mg total) by mouth daily.      0   . VITAMIN D3 50000 UNITS PO CAPS   Oral   Take 50,000 Units by mouth once a week.         . INSULIN ASPART 100 UNIT/ML Dane SOLN   Subcutaneous   Inject 0-15 Units into the skin 3 (three) times daily with meals.   1 vial      . MIRTAZAPINE 7.5 MG PO TABS   Oral   Take 1 tablet (7.5 mg total) by mouth at bedtime.         . OMEPRAZOLE 20 MG PO CPDR   Oral   Take 2 capsules (40 mg total) by mouth daily.         Burna Mortimer MOUTH PAIN MT   Mouth/Throat   Use as directed 1 spray in the mouth or throat as needed.         Marland Kitchen SPIRONOLACTONE 25 MG PO TABS   Oral   Take 1 tablet (25 mg total) by mouth daily.           BP 140/70  Pulse 83  Temp 98 F (36.7 C) (Oral)  Resp 16  SpO2 100%  Physical Exam  Nursing note and vitals reviewed. Constitutional: She is oriented to person, place, and time. She appears well-developed and well-nourished. No distress.  HENT:  Head: Normocephalic and atraumatic.  Mouth/Throat: Oropharynx is clear and moist.  Eyes:  Conjunctivae normal and EOM are normal. Pupils are equal, round, and reactive to light.  Neck: Normal range of motion. Neck supple.  Cardiovascular: Normal rate, regular rhythm and intact distal pulses.   No murmur heard. Pulmonary/Chest: Effort normal and breath sounds normal. No respiratory distress. She has no wheezes. She has no rales.   She exhibits tenderness.  Abdominal: Soft. Normal appearance. She exhibits no distension. There is tenderness in the suprapubic area. There is no rebound and no guarding.  Musculoskeletal: Normal range of motion. She exhibits no edema and no tenderness.  Neurological: She is alert and oriented to person, place, and time.  Skin: Skin is warm and dry. No rash noted. No erythema.  Psychiatric: She has a normal mood and affect. Her behavior is normal.    ED Course  Procedures (including critical care time)  Labs Reviewed  CBC - Abnormal; Notable for the following:    WBC 11.3 (*)     RDW 17.6 (*)     Platelets 124 (*)     All other components within normal limits  BASIC METABOLIC PANEL - Abnormal; Notable for the following:    Sodium 132 (*)     Glucose, Bld 103 (*)     Creatinine, Ser 1.57 (*)     GFR calc non Af Amer 30 (*)     GFR calc Af Amer 35 (*)     All other components within normal limits  URINALYSIS, ROUTINE W REFLEX MICROSCOPIC - Abnormal; Notable for the following:    APPearance HAZY (*)     Hgb urine dipstick TRACE (*)     Protein, ur 100 (*)     Leukocytes, UA SMALL (*)     All other components within normal limits  HEPATIC FUNCTION PANEL - Abnormal; Notable for the following:    Albumin 2.9 (*)     All other components within normal limits  URINE MICROSCOPIC-ADD ON - Abnormal; Notable for the following:    Squamous Epithelial / LPF FEW (*)     Bacteria, UA MANY (*)     All other components within normal limits  DIFFERENTIAL  POCT I-STAT TROPONIN I  URINE CULTURE   Dg Chest 2 View  08/14/2012  *RADIOLOGY REPORT*   Clinical Data: Chest pain  CHEST - 2 VIEW  Comparison: 06/07/2012  Findings: Normal heart size.  No effusion or edema peri right midlung opacity is identified measuring 1.9 cm.  New from previous exam.  Left lung is clear.  IMPRESSION:  1.  Indeterminant opacity within the right midlung.  In the acute setting this may represent a focal area of pneumonia.  Radiographic followup is recommended to ensure resolution.  If this does not resolve then CT of the chest should be considered.   Original Report Authenticated By: Signa Kell, M.D.      1. UTI (lower urinary tract infection)   2. CAP (community acquired pneumonia)       MDM   Patient with a prior history of cardiac arrest who currently lives at home with the family member. She states over the last week she's become more short of breath with coughing and abdominal pain. She states  the abdominal pain is in the lower abdomen and is worse after eating. She saw her PCP on the 16th and was started on proton ex which she states is not helping.  She has normal vital signs on exam and has lower abdominal pain without focal area of tenderness concerning for diverticulitis or appendicitis. Possible UTI she has had this in the past with similar symptoms. Also today chest x-ray with an indeterminate opacity in the right middle lung. Given her new worsening shortness of breath and back pain in the similar area with increased cough this could be a new pneumonia.   CBC, CMP, UA pending. Patient given IV bolus of saline.   8:38 PM Illicit leukocytosis with normal CMP and UA suggestive of UTI. Patient given Rocephin and azathioprine admitted for further evaluation to   Gwyneth Sprout, MD 08/14/12 2038  Gwyneth Sprout, MD 08/14/12 2138

## 2012-08-14 NOTE — H&P (Signed)
History and Physical  [name] ZOX:[id number] DOB: 1930/10/03 DOA: 08/14/2012  Referring physician: Dr. Anitra Lauth (ER) PCP: [name], MD   Chief Complaint: abdominal pain and shortness of breath  HPI:  Patient is 78 year old female with PMH most significant for blindness, HTN, CAD, Stroke, CKD stage III, hyperlipidemia and arthritis comes in today with chief complaints of abdominal pain since last 10 days. Pain started in the entire abdomen but localized to lower abdomen, medium in intensity, non radiating, no exacerbation or relieving factors, a/w difficulty urination. Patient also started having cough a/w shortness of breath about 5 days prior to admission. Patient is usually bed bound and not very functional due to her disability. Patient also endorses sweating since last 5 days. No sputum noted but patient vomited once soon after drinking prune juice. No chest pain, no headaches, no palpitations, no swelling in extremities, no orthopnea or PND.    Review of Systems:  16 point ROS was negative other then what is mentioned in HPI  Past Medical History  Diagnosis Date  . Arthritis   . Hypertension   . Coronary artery disease     a. Neg stress test 2008. b. ?questionable history - pt thought she had heart cath in 1970's but details were unclear.  . Stroke     a. Details unclear.  . Seizures     a. Due to hyponatremia, resolved.  Marland Kitchen History of gout   . Glaucoma   . Cataracts, bilateral   . Acute kidney injury   . CKD (chronic kidney disease), stage III   . Hyperlipidemia   . Gallstones   . Cerebrovascular disease   . Hyponatremia     a. H/o 2011 ?SIADH with seizures, encephalopathy at that time.  . Temporal arteritis   . Cardiac arrest 05/2012    x 2, thought due to renal failure and hypoK  . Dementia 06/19/2012    Past Surgical History  Procedure Date  . Eye surgery   . Abdominal hysterectomy   . Coronary angioplasty with stent placement     Social History:   reports that she has quit smoking. She has never used smokeless tobacco. She reports that she does not drink alcohol or use illicit drugs.  Allergies  Allergen Reactions  . Lactose Intolerance (Gi) Diarrhea and Nausea Only    Family History  Problem Relation Age of Onset  . Hypertension    . Diabetes type II    . Hypertension Mother   . Hypertension Father      Prior to Admission medications   Medication Sig Start Date End Date Taking? Authorizing Provider  acetaminophen (TYLENOL) 325 MG tablet Take 325 mg by mouth daily as needed.   Yes Historical Provider, MD  bisoprolol (ZEBETA) 5 MG tablet Take 1 tablet (5 mg total) by mouth daily. 07/07/12  Yes Brent Bulla, MD  brimonidine-timolol (COMBIGAN) 0.2-0.5 % ophthalmic solution Place 1 drop into both eyes every 12 (twelve) hours. 07/07/12  Yes Brent Bulla, MD  brinzolamide (AZOPT) 1 % ophthalmic suspension Place 1 drop into both eyes daily. 07/07/12  Yes Brent Bulla, MD  escitalopram (LEXAPRO) 5 MG tablet Take 5 mg by mouth daily.   Yes Historical Provider, MD  ezetimibe (ZETIA) 10 MG tablet Take 1 tablet (10 mg total) by mouth daily. 07/07/12  Yes Brent Bulla, MD  levETIRAcetam (KEPPRA) 250 MG tablet Take 250 mg by mouth 2 (two) times daily.  07/07/12  Yes Gasper Lloyd  Ritch, MD  pantoprazole (PROTONIX) 40 MG tablet Take 40 mg by mouth daily.   Yes Historical Provider, MD  predniSONE (DELTASONE) 10 MG tablet Take 5 mg by mouth daily.   Yes Historical Provider, MD   Physical Exam: Filed Vitals:   08/14/12 1647 08/14/12 2030  BP: 140/70 123/74  Pulse: 83 76  Temp: 98 F (36.7 C)   TempSrc: Oral   Resp: 16 15  SpO2: 100% 96%  BP 123/74  Pulse 76  Temp 98 F (36.7 C) (Oral)  Resp 15  SpO2 96%  General Appearance:    Alert, cooperative, no distress, appears stated age, lying in bed comfortably, blind in both eyes  Head:    Normocephalic, without obvious abnormality, atraumatic  Eyes:    Blind both eyes (due to cataracts)    Nose:   Nares normal, septum midline, mucosa normal, no drainage    or sinus tenderness  Throat:   Lips, mucosa, and tongue normal; teeth and gums normal  Neck:   Supple, symmetrical, trachea midline, no adenopathy;    thyroid:  no enlargement/tenderness/nodules; no carotid   bruit or JVD  Back:     Symmetric, no curvature, ROM normal, no CVA tenderness  Lungs:     Clear to auscultation bilaterally anteriorly, respirations unlabored  Chest Wall:    No tenderness or deformity   Heart:    Regular rate and rhythm, S1 and S2 normal, no murmur, rub   or gallop  Abdomen:     Soft, non-tender, bowel sounds active all four quadrants,    no masses, no organomegaly  Extremities:   Extremities normal, atraumatic, no cyanosis or edema  Pulses:   2+ and symmetric all extremities  Skin:   Skin color, texture, turgor normal, no rashes or lesions  Lymph nodes:   Cervical, supraclavicular, and axillary nodes normal  Neurologic:   CNII-XII intact, normal strength, sensation and reflexes    throughout     Wt Readings from Last 3 Encounters:  07/12/12 122 lb (55.339 kg)  06/14/12 135 lb 9.3 oz (61.5 kg)  05/26/12 136 lb 11 oz (62 kg)    Labs on Admission:  Basic Metabolic Panel:  Lab 08/14/12 4098  NA 132*  K 4.6  CL 96  CO2 22  GLUCOSE 103*  BUN 23  CREATININE 1.57*  CALCIUM 9.6  MG --  PHOS --    Liver Function Tests:  Lab 08/14/12 1744  AST 19  ALT 12  ALKPHOS 47  BILITOT 0.3  PROT 8.2  ALBUMIN 2.9*    CBC:  Lab 08/14/12 1744  WBC 11.3*  NEUTROABS 6.8  HGB 13.7  HCT 40.8  MCV 94.7  PLT 124*     Troponin (Point of Care Test)  [facility] 08/14/12 1847  TROPIPOC 0.00    BNP (last 3 results)  Basename 06/04/12 1244  PROBNP 123.7    CBG: No results found for this basename: GLUCAP:5 in the last 168 hours   Radiological Exams on Admission: Dg Chest 2 View  08/14/2012  *RADIOLOGY REPORT*  Clinical Data: Chest pain  CHEST - 2 VIEW  Comparison: 06/07/2012   Findings: Normal heart size.  No effusion or edema peri right midlung opacity is identified measuring 1.9 cm.  New from previous exam.  Left lung is clear.  IMPRESSION:  1.  Indeterminant opacity within the right midlung.  In the acute setting this may represent a focal area of pneumonia.  Radiographic followup is recommended to ensure resolution.  If this does not resolve then CT of the chest should be considered.   Original Report Authenticated By: Signa Kell, M.D.     EKG: Independently reviewed. 83bpm, normal sinus, normal axis, no ST and T wave changes seen   Principal Problem:  *Community acquired pneumonia Active Problems:  UTI (urinary tract infection)  CKD (chronic kidney disease), stage III  Hyperlipidemia  Temporal arteritis  Atrial fibrillation  Hypertension  Dementia  Depression  GERD (gastroesophageal reflux disease)   Assessment/Plan 1. Patient is being admitted for community acquired PNA  -admit to telemetry -start rocephin and azithromycin -Order blood cultures, urine legionella and strep antigens -sputum assessment -Rocephin will cover for UTI -IVF for 12 hours and then reassess -PT/OT consult -Continue home meds without changes   Code Status: Full Family Communication: Patient updated about the plan Disposition Plan/Anticipated LOS: 3-4 days and based on PT/OT evaluation  Time spent: 40 minutes  Lars Mage, MD  [facility] Team 5  If 7PM-7AM, please contact night-coverage at [email or web address], password [facility] 08/14/2012, 8:59 PM

## 2012-08-15 ENCOUNTER — Inpatient Hospital Stay (HOSPITAL_COMMUNITY): Payer: Medicare Other

## 2012-08-15 DIAGNOSIS — R109 Unspecified abdominal pain: Secondary | ICD-10-CM

## 2012-08-15 DIAGNOSIS — M79609 Pain in unspecified limb: Secondary | ICD-10-CM

## 2012-08-15 DIAGNOSIS — J189 Pneumonia, unspecified organism: Secondary | ICD-10-CM

## 2012-08-15 DIAGNOSIS — N390 Urinary tract infection, site not specified: Secondary | ICD-10-CM

## 2012-08-15 DIAGNOSIS — N189 Chronic kidney disease, unspecified: Secondary | ICD-10-CM

## 2012-08-15 DIAGNOSIS — R609 Edema, unspecified: Secondary | ICD-10-CM

## 2012-08-15 LAB — INFLUENZA PANEL BY PCR (TYPE A & B)
H1N1 flu by pcr: NOT DETECTED
Influenza A By PCR: NEGATIVE
Influenza B By PCR: NEGATIVE

## 2012-08-15 LAB — CBC
MCH: 32.2 pg (ref 26.0–34.0)
MCHC: 34.5 g/dL (ref 30.0–36.0)
Platelets: 109 10*3/uL — ABNORMAL LOW (ref 150–400)
RBC: 3.95 MIL/uL (ref 3.87–5.11)
RDW: 17.4 % — ABNORMAL HIGH (ref 11.5–15.5)

## 2012-08-15 LAB — BASIC METABOLIC PANEL
Calcium: 8.7 mg/dL (ref 8.4–10.5)
GFR calc non Af Amer: 38 mL/min — ABNORMAL LOW (ref >90)
Glucose, Bld: 117 mg/dL — ABNORMAL HIGH (ref 70–99)
Sodium: 131 mEq/L — ABNORMAL LOW (ref 135–145)

## 2012-08-15 MED ORDER — TECHNETIUM TO 99M ALBUMIN AGGREGATED
3.0000 | Freq: Once | INTRAVENOUS | Status: AC | PRN
  Administered 2012-08-15: 3 via INTRAVENOUS

## 2012-08-15 MED ORDER — VANCOMYCIN HCL IN DEXTROSE 1-5 GM/200ML-% IV SOLN
1000.0000 mg | Freq: Once | INTRAVENOUS | Status: AC
  Administered 2012-08-15: 1000 mg via INTRAVENOUS
  Filled 2012-08-15: qty 200

## 2012-08-15 MED ORDER — VANCOMYCIN HCL 500 MG IV SOLR
500.0000 mg | INTRAVENOUS | Status: DC
  Administered 2012-08-16: 500 mg via INTRAVENOUS
  Filled 2012-08-15: qty 500

## 2012-08-15 MED ORDER — TECHNETIUM TC 99M DIETHYLENETRIAME-PENTAACETIC ACID: 40.0000 | Freq: Once | INTRAVENOUS | Status: AC | PRN

## 2012-08-15 MED ORDER — HEPARIN BOLUS VIA INFUSION
3500.0000 [IU] | Freq: Once | INTRAVENOUS | Status: DC
  Filled 2012-08-15: qty 3500

## 2012-08-15 MED ORDER — IOHEXOL 300 MG/ML  SOLN: 20.0000 mL | INTRAMUSCULAR | Status: AC

## 2012-08-15 MED ORDER — PIPERACILLIN-TAZOBACTAM 3.375 G IVPB 30 MIN
3.3750 g | Freq: Three times a day (TID) | INTRAVENOUS | Status: DC
  Administered 2012-08-15 – 2012-08-17 (×8): 3.375 g via INTRAVENOUS
  Filled 2012-08-15 (×11): qty 50

## 2012-08-15 MED ORDER — HEPARIN BOLUS VIA INFUSION
3000.0000 [IU] | Freq: Once | INTRAVENOUS | Status: AC
  Administered 2012-08-15: 3000 [IU] via INTRAVENOUS
  Filled 2012-08-15: qty 3000

## 2012-08-15 MED ORDER — HEPARIN (PORCINE) IN NACL 100-0.45 UNIT/ML-% IJ SOLN
900.0000 [IU]/h | INTRAMUSCULAR | Status: DC
  Administered 2012-08-15: 900 [IU]/h via INTRAVENOUS
  Filled 2012-08-15: qty 250

## 2012-08-15 NOTE — Progress Notes (Signed)
ANTIBIOTIC CONSULT NOTE - INITIAL  Pharmacy Consult for vancomycin Indication: rule out pneumonia  Allergies  Allergen Reactions  . Lactose Intolerance (Gi) Diarrhea and Nausea Only    Patient Measurements: Height: 5\' 1"  (154.9 cm) Weight: 126 lb 3.2 oz (57.244 kg) IBW/kg (Calculated) : 47.8   Vital Signs: Temp: 99.5 F (37.5 C) (12/31 0431) Temp src: Oral (12/31 0431) BP: 130/77 mmHg (12/31 0431) Pulse Rate: 87  (12/31 0431) Intake/Output from previous day:   Intake/Output from this shift:    Labs:  [facility] 08/15/12 0555 08/14/12 1744  WBC 12.3* 11.3*  HGB 12.7 13.7  PLT 109* 124*  LABCREA -- --  CREATININE 1.31* 1.57*   Estimated Creatinine Clearance: 26.3 ml/min (by C-G formula based on Cr of 1.31). No results found for this basename: VANCOTROUGH:2,VANCOPEAK:2,VANCORANDOM:2,GENTTROUGH:2,GENTPEAK:2,GENTRANDOM:2,TOBRATROUGH:2,TOBRAPEAK:2,TOBRARND:2,AMIKACINPEAK:2,AMIKACINTROU:2,AMIKACIN:2, in the last 72 hours   Microbiology: No results found for this or any previous visit (from the past 720 hour(s)).  Medical History: Past Medical History  Diagnosis Date  . Arthritis   . Hypertension   . Coronary artery disease     a. Neg stress test 2008. b. ?questionable history - pt thought she had heart cath in 1970's but details were unclear.  . Stroke     a. Details unclear.  . Seizures     a. Due to hyponatremia, resolved.  Marland Kitchen History of gout   . Glaucoma   . Cataracts, bilateral   . Acute kidney injury   . CKD (chronic kidney disease), stage III   . Hyperlipidemia   . Gallstones   . Cerebrovascular disease   . Hyponatremia     a. H/o 2011 ?SIADH with seizures, encephalopathy at that time.  . Temporal arteritis   . Cardiac arrest 05/2012    x 2, thought due to renal failure and hypoK  . Dementia 06/19/2012    Medications:  Prescriptions prior to admission  Medication Sig Dispense Refill  . acetaminophen (TYLENOL) 325 MG tablet Take 325 mg by mouth daily  as needed.      . bisoprolol (ZEBETA) 5 MG tablet Take 1 tablet (5 mg total) by mouth daily.      . brimonidine-timolol (COMBIGAN) 0.2-0.5 % ophthalmic solution Place 1 drop into both eyes every 12 (twelve) hours.      . brinzolamide (AZOPT) 1 % ophthalmic suspension Place 1 drop into both eyes daily.      Marland Kitchen escitalopram (LEXAPRO) 5 MG tablet Take 5 mg by mouth daily.      Marland Kitchen ezetimibe (ZETIA) 10 MG tablet Take 1 tablet (10 mg total) by mouth daily.  30 tablet  0  . levETIRAcetam (KEPPRA) 250 MG tablet Take 250 mg by mouth 2 (two) times daily.       . pantoprazole (PROTONIX) 40 MG tablet Take 40 mg by mouth daily.      . predniSONE (DELTASONE) 10 MG tablet Take 5 mg by mouth daily.       Assessment: 78 yo female with CAP on azithromycin/rocephin and to change to vancomycin and zosyn. SCr= 1.31 and CrCl~ 25 ml/min.  Goal of Therapy:  Vancomycin trough level 15-20 mcg/ml  Plan:  -Vancomycin 1000mg  IV x1 followed by 500mg  IV q24hr -No zosyn changes needed -Will follow cultures, renal function and clinical progress  Harland German, Pharm D 08/15/2012 8:18 AM

## 2012-08-15 NOTE — Progress Notes (Signed)
OT Cancellation Note  Patient Details Name: [name] MRN: [id number] DOB: [birth year]   Cancelled Treatment:    Reason Eval/Treat Not Completed: Patient at procedure or test/ unavailable  [name] [phone] 08/15/2012, 3:14 PM

## 2012-08-15 NOTE — Evaluation (Signed)
Physical Therapy Evaluation Patient Details Name: [name] MRN: [id number] DOB: 07-21-1931 Today's Date: 08/15/2012 Time: 1478-2956 PT Time Calculation (min): 30 min  PT Assessment / Plan / Recommendation Clinical Impression  pt admitted with abdominal pain over past 10 days.  On admission, pt found to have CAP and bouts of loose stools.  Pt can benefit from PT to maximize function.    PT Assessment  Patient needs continued PT services    Follow Up Recommendations  Home health PT;Supervision for mobility/OOB    Does the patient have the potential to tolerate intense rehabilitation      Barriers to Discharge        Equipment Recommendations  None recommended by PT    Recommendations for Other Services     Frequency Min 3X/week    Precautions / Restrictions Precautions Precautions: Fall Restrictions Weight Bearing Restrictions: No   Pertinent Vitals/Pain      Mobility  Bed Mobility Bed Mobility: Supine to Sit;Sitting - Scoot to [place] of Bed;Sit to Supine Supine to Sit: 4: Min assist;HOB elevated Sitting - Scoot to [place] of Bed: 4: Min guard Sit to Supine: 4: Min assist;HOB flat Details for Bed Mobility Assistance: mild struggle to scoot to EOB; pt guarded likely from visual deficits Transfers Transfers: Sit to Stand;Stand to Sit Sit to Stand: 4: Min assist;With upper extremity assist;From bed Stand to Sit: 4: Min assist;With upper extremity assist;To bed Details for Transfer Assistance: v/tc's for hand placement; steadying assist Ambulation/Gait Ambulation/Gait Assistance: 4: Min assist Ambulation Distance (Feet): 3 Feet (forward and back) Assistive device: Rolling walker Ambulation/Gait Assistance Details: guarded and weak gait; stability assist and RW maneuvering assist Gait Pattern: Step-through pattern Stairs: No Wheelchair Mobility Wheelchair Mobility: No    Shoulder Instructions     Exercises     PT Diagnosis: Difficulty walking;Generalized  weakness  PT Problem List: Decreased strength;Decreased activity tolerance;Decreased mobility;Decreased balance;Decreased knowledge of use of DME PT Treatment Interventions: DME instruction;Functional mobility training;Therapeutic activities;Balance training;Patient/family education   PT Goals Acute Rehab PT Goals PT Goal Formulation: With patient Time For Goal Achievement: 08/29/12 Potential to Achieve Goals: Good Pt will go Supine/Side to Sit: with supervision PT Goal: Supine/Side to Sit - Progress: Goal set today Pt will go Sit to Stand: with supervision PT Goal: Sit to Stand - Progress: Goal set today Pt will Transfer Bed to Chair/Chair to Bed: with supervision PT Transfer Goal: Bed to Chair/Chair to Bed - Progress: Goal set today Pt will Ambulate: 16 - 50 feet;with supervision;with least restrictive assistive device PT Goal: Ambulate - Progress: Goal set today  Visit Information  Last PT Received On: 08/15/12 Assistance Needed: +1    Subjective Data  Subjective: I usually stay in my room most of the time.  I don't get up much because I hurt, but I can do for myself Patient Stated Goal: Home when I can   Prior Functioning  Home Living Lives With: Alone;Other (Comment) (technically, but son is at her home all the time) Available Help at Discharge: Family;Other (Comment);Personal care attendant (son is her  caregiver; PCA 2-3 times/week, 3-4 hrs/day) Type of Home: House Home Access: Stairs to enter [employer] of Steps: 1 Home Layout: One level Bathroom Shower/Tub: Tub/shower unit;Curtain Firefighter: Standard Home Adaptive Equipment: Grab bars in shower;Grab bars around toilet;Bedside commode/3-in-1;Shower chair with back;Walker - rolling;Straight cane Prior Function Level of Independence: Needs assistance;Independent with assistive device(s) Driving: No Communication Communication: No difficulties    Cognition  Overall Cognitive Status:  Appears within  functional limits for tasks assessed/performed Arousal/Alertness: Awake/alert Orientation Level: Appears intact for tasks assessed Behavior During Session: [facility] for tasks performed    Extremity/Trunk Assessment Right Lower Extremity Assessment RLE ROM/Strength/Tone: [facility] for tasks assessed Left Lower Extremity Assessment LLE ROM/Strength/Tone: Deficits LLE ROM/Strength/Tone Deficits: grossly 3/5, residually weak from past stroke; minimally functional for gait with RW   Balance Balance Balance Assessed: Yes Static Sitting Balance Static Sitting - Balance Support: Feet supported;No upper extremity supported;Left upper extremity supported;Right upper extremity supported Static Sitting - Level of Assistance: 5: Stand by assistance Static Sitting - Comment/# of Minutes: 4  End of Session PT - End of Session Activity Tolerance: Patient limited by fatigue Patient left: in bed;with call bell/phone within reach Nurse Communication: Mobility status  GP     Georg Ang, Eliseo Gum 08/15/2012, 1:08 PM  08/15/2012  Weldon Bing, PT [phone] (pager)

## 2012-08-15 NOTE — Care Management Note (Addendum)
    Page 1 of 2   08/18/2012     4:18:34 PM   CARE MANAGEMENT NOTE 08/18/2012  Patient:  [name], [name]   Account Number:  [id number]  Date Initiated:  08/15/2012  Documentation initiated by:  Junius Creamer  Subjective/Objective Assessment:   adm w pneumonia     Action/Plan:   lives w fam, pcp dr Fleet Contras, act w [facility]. they are aware pt in hospital   Anticipated DC Date:  08/20/2012   Anticipated DC Plan:  HOME W HOME HEALTH SERVICES      DC Planning Services  CM consult      [facility] Choice  Resumption Of Svcs/PTA Provider   Choice offered to / List presented to:          [facility] arranged  HH-1 RN      [facility] agency  [facility]   Status of service:  In process, will continue to follow Medicare Important Message given?   (If response is "NO", the following Medicare IM given date fields will be blank) Date Medicare IM given:   Date Additional Medicare IM given:    Discharge Disposition:  HOME W HOME HEALTH SERVICES  Per UR Regulation:  Reviewed for med. necessity/level of care/duration of stay  If discussed at Long Length of Stay Meetings, dates discussed:    Comments:  08/18/12 Ha Placeres,RN,BSN [phone] PTA, PT HAS FAMILY NEARBY AND FULL-TIME HH AIDE, AS SHE IS BLIND.  PLAN TO RETURN HOME WITH SERVICES AS PRIOR TO ADMISSION.  SHOULD PT NEED HOME LOVENOX, SHE DOES HAVE INSURANCE COVERAGE.  COST WILL BE 33% OF TOTAL COST OF MED. WILL FOLLOW.  12/31 1209 debbie dowell rn,bsn [phone]

## 2012-08-15 NOTE — Progress Notes (Signed)
*  PRELIMINARY RESULTS* Vascular Ultrasound Lower extremity venous duplex has been completed.  Preliminary findings: Right= DVT involving the mid femoral vein.    Left= Occlusive DVT involving the common femoral, femoral, profunda femoral, popliteal, posterior tibial, and peroneal veins. No flow is noted with Color Doppler.  Farrel Demark, RDMS, RVT 08/15/2012, 1:52 PM

## 2012-08-15 NOTE — Progress Notes (Addendum)
ANTICOAGULATION CONSULT NOTE - Initial Consult  Pharmacy Consult for heparin Indication: DVT  Allergies  Allergen Reactions  . Lactose Intolerance (Gi) Diarrhea and Nausea Only    Patient Measurements: Height: 5\' 1"  (154.9 cm) Weight: 126 lb 3.2 oz (57.244 kg) IBW/kg (Calculated) : 47.8    Vital Signs: Temp: 99.5 F (37.5 C) (12/31 0431) Temp src: Oral (12/31 0431) BP: 130/77 mmHg (12/31 0431) Pulse Rate: 87  (12/31 0431)  Labs:  Basename 08/15/12 0555 08/14/12 1744  HGB 12.7 13.7  HCT 36.8 40.8  PLT 109* 124*  APTT -- --  LABPROT -- --  INR -- --  HEPARINUNFRC -- --  CREATININE 1.31* 1.57*  CKTOTAL -- --  CKMB -- --  TROPONINI -- --    Estimated Creatinine Clearance: 26.3 ml/min (by C-G formula based on Cr of 1.31).   Medical History: Past Medical History  Diagnosis Date  . Arthritis   . Hypertension   . Coronary artery disease     a. Neg stress test 2008. b. ?questionable history - pt thought she had heart cath in 1970's but details were unclear.  . Stroke     a. Details unclear.  . Seizures     a. Due to hyponatremia, resolved.  Marland Kitchen History of gout   . Glaucoma   . Cataracts, bilateral   . Acute kidney injury   . CKD (chronic kidney disease), stage III   . Hyperlipidemia   . Gallstones   . Cerebrovascular disease   . Hyponatremia     a. H/o 2011 ?SIADH with seizures, encephalopathy at that time.  . Temporal arteritis   . Cardiac arrest 05/2012    x 2, thought due to renal failure and hypoK  . Dementia 06/19/2012    Medications:  Prescriptions prior to admission  Medication Sig Dispense Refill  . acetaminophen (TYLENOL) 325 MG tablet Take 325 mg by mouth daily as needed.      . bisoprolol (ZEBETA) 5 MG tablet Take 1 tablet (5 mg total) by mouth daily.      . brimonidine-timolol (COMBIGAN) 0.2-0.5 % ophthalmic solution Place 1 drop into both eyes every 12 (twelve) hours.      . brinzolamide (AZOPT) 1 % ophthalmic suspension Place 1 drop into  both eyes daily.      Marland Kitchen escitalopram (LEXAPRO) 5 MG tablet Take 5 mg by mouth daily.      Marland Kitchen ezetimibe (ZETIA) 10 MG tablet Take 1 tablet (10 mg total) by mouth daily.  30 tablet  0  . levETIRAcetam (KEPPRA) 250 MG tablet Take 250 mg by mouth 2 (two) times daily.       . pantoprazole (PROTONIX) 40 MG tablet Take 40 mg by mouth daily.      . predniSONE (DELTASONE) 10 MG tablet Take 5 mg by mouth daily.        Assessment: 78 yo female with bilateral DVT to start on heparin. Patient noted on sq heparin (last dose 5000 units at 6am today)  Goal of Therapy:  Heparin level 0.3-0.7 units/ml Monitor platelets by anticoagulation protocol: Yes   Plan:  -Heparin 3000 unit IV bolus followed by 900 units/hr (~ 16 units/kg/hr) -Heparin level in 8 hours and daily wth CBC daily  Harland German, Pharm D 08/15/2012 2:26 PM

## 2012-08-15 NOTE — Progress Notes (Signed)
[facility] PROGRESS NOTE  [name] JWJ:[id number] DOB: 1930-12-13 DOA: 08/14/2012 PCP: [name], MD  Assessment/Plan: Healthcare associated pneumonia -Discontinue azithromycin and ceftriaxone -Start vancomycin and Zosyn -Followup blood cultures -Pulmonary toilet -Influenza PCR -Continue IV fluids Abdominal pain -CT abdomen pelvis -Check lipase -Continue empiric antibiotics Pyuria -Followup urine culture -Continue antibiotics Bilateral lower extremity edema and pain -Venous ultrasound to rule out DVT -If positive DVT, may need to evaluate for pulmonary embolus if patient continues to have shortness of breath Atrial fibrillation -Patient remained in sinus at this time Acute on chronic renal failure/CKD stage III -Serum creatinine back to baseline with intravenous fluids History of temporal arteritis -Continue long-term prednisone History of NSTEMI       Disposition Plan:   Home when medically stable Total time spent greater than 60 minutes    Antibiotics:  Vancomycin/Zosyn-08/15/2012>>>    Procedures/Studies: Dg Chest 2 View  08/14/2012  *RADIOLOGY REPORT*  Clinical Data: Chest pain  CHEST - 2 VIEW  Comparison: 06/07/2012  Findings: Normal heart size.  No effusion or edema peri right midlung opacity is identified measuring 1.9 cm.  New from previous exam.  Left lung is clear.  IMPRESSION:  1.  Indeterminant opacity within the right midlung.  In the acute setting this may represent a focal area of pneumonia.  Radiographic followup is recommended to ensure resolution.  If this does not resolve then CT of the chest should be considered.   Original Report Authenticated By: Signa Kell, M.D.          Subjective: Patient states that she hurts all lower. Particularly, she complains of abdominal pain isolated to the lower quadrant. Denies any vomiting last night. Has some nausea. Denies diarrhea. No fevers or chills. Complains of some chest  discomfort with her shortness of breath, which she states is better than yesterday. Denies any diarrhea, hematochezia, melena.   Objective: Filed Vitals:   08/14/12 2030 08/14/12 2100 08/14/12 2235 08/15/12 0431  BP: 123/74 128/73 130/78 130/77  Pulse: 76 76 81 87  Temp:   98.7 F (37.1 C) 99.5 F (37.5 C)  TempSrc:   Oral Oral  Resp: 15 21 18 18   Height:   5\' 1"  (1.549 m)   Weight:   57.244 kg (126 lb 3.2 oz)   SpO2: 96% 95% 100% 98%   No intake or output data in the 24 hours ending 08/15/12 1005 Weight change:  Exam:   General:  Pt is alert, follows commands appropriately, not in acute distress  HEENT: No icterus, No thrush,[place]/AT  Cardiovascular: RRR, S1/S2, no rubs, no gallops  Respiratory:Diminished breath sounds at the bases, right greater than left. No wheezes or rhonchi. Good air movement.   Abdomen: Soft/+BS,non distended, no guarding; diffusely tender to isolate the left lower quadrant. No peritoneal signs. No rebound tenderness.   Extremities: 1+ edema right greater than left leg, No lymphangitis, No petechiae, No rashes, no synovitis  Data Reviewed: Basic Metabolic Panel:  Lab 08/15/12 6213 08/14/12 1744  NA 131* 132*  K 4.0 4.6  CL 98 96  CO2 17* 22  GLUCOSE 117* 103*  BUN 19 23  CREATININE 1.31* 1.57*  CALCIUM 8.7 9.6  MG -- --  PHOS -- --   Liver Function Tests:  Lab 08/14/12 1744  AST 19  ALT 12  ALKPHOS 47  BILITOT 0.3  PROT 8.2  ALBUMIN 2.9*   No results found for this basename: LIPASE:5,AMYLASE:5 in the last 168 hours No results found for this  basename: AMMONIA:5 in the last 168 hours CBC:  Lab 08/15/12 0555 08/14/12 1744  WBC 12.3* 11.3*  NEUTROABS -- 6.8  HGB 12.7 13.7  HCT 36.8 40.8  MCV 93.2 94.7  PLT 109* 124*   Cardiac Enzymes: No results found for this basename: CKTOTAL:5,CKMB:5,CKMBINDEX:5,TROPONINI:5 in the last 168 hours BNP: No components found with this basename: POCBNP:5 CBG: No results found for this basename:  GLUCAP:5 in the last 168 hours  No results found for this or any previous visit (from the past 240 hour(s)).   Scheduled Meds:   . sodium chloride   Intravenous STAT  . bisoprolol  5 mg Oral Daily  . brimonidine  1 drop Both Eyes BID  . brinzolamide  1 drop Both Eyes Daily  . docusate sodium  100 mg Oral BID  . escitalopram  5 mg Oral Daily  . ezetimibe  10 mg Oral Daily  . heparin  5,000 Units Subcutaneous Q8H  . levETIRAcetam  250 mg Oral BID  . pantoprazole  40 mg Oral Daily  . piperacillin-tazobactam  3.375 g Intravenous Q8H  . pneumococcal 23 valent vaccine  0.5 mL Intramuscular Tomorrow-1000  . predniSONE  5 mg Oral Q breakfast  . sodium chloride  3 mL Intravenous Q12H  . timolol  1 drop Both Eyes BID  . vancomycin  500 mg Intravenous Q24H  . vancomycin  1,000 mg Intravenous Once   Continuous Infusions:   . sodium chloride 75 mL/hr (08/14/12 2312)     Cadie Sorci, DO  [facility] Pager [phone]  If 7PM-7AM, please contact night-coverage [email or web address] Password [email or web address] 08/15/2012, 10:05 AM   LOS: 1 day

## 2012-08-15 NOTE — Plan of Care (Signed)
Problem: Consults Goal: Diagnosis - Venous Thromboembolism (VTE) Choose a selection Outcome: Completed/Met Date Met:  08/15/12 DVT (Deep Vein Thrombosis)

## 2012-08-16 DIAGNOSIS — I4891 Unspecified atrial fibrillation: Secondary | ICD-10-CM

## 2012-08-16 DIAGNOSIS — N189 Chronic kidney disease, unspecified: Secondary | ICD-10-CM

## 2012-08-16 DIAGNOSIS — N179 Acute kidney failure, unspecified: Secondary | ICD-10-CM

## 2012-08-16 DIAGNOSIS — J189 Pneumonia, unspecified organism: Principal | ICD-10-CM

## 2012-08-16 LAB — BASIC METABOLIC PANEL
BUN: 19 mg/dL (ref 6–23)
Chloride: 99 mEq/L (ref 96–112)
GFR calc Af Amer: 39 mL/min — ABNORMAL LOW (ref >90)
Potassium: 4.3 mEq/L (ref 3.5–5.1)
Sodium: 135 mEq/L (ref 135–145)

## 2012-08-16 LAB — URINE CULTURE

## 2012-08-16 LAB — CBC WITH DIFFERENTIAL/PLATELET
Basophils Relative: 0 % (ref 0–1)
Hemoglobin: 12.7 g/dL (ref 12.0–15.0)
Lymphs Abs: 3.2 10*3/uL (ref 0.7–4.0)
MCHC: 33.8 g/dL (ref 30.0–36.0)
Monocytes Relative: 9 % (ref 3–12)
Neutro Abs: 9.1 10*3/uL — ABNORMAL HIGH (ref 1.7–7.7)
Neutrophils Relative %: 67 % (ref 43–77)
Platelets: 117 10*3/uL — ABNORMAL LOW (ref 150–400)
RBC: 4.02 MIL/uL (ref 3.87–5.11)

## 2012-08-16 LAB — PROTIME-INR: Prothrombin Time: 14.5 seconds (ref 11.6–15.2)

## 2012-08-16 LAB — HEPARIN LEVEL (UNFRACTIONATED)
Heparin Unfractionated: >2 IU/mL — ABNORMAL HIGH (ref 0.30–0.70)
Heparin Unfractionated: 0.56 IU/mL (ref 0.30–0.70)

## 2012-08-16 MED ORDER — RIVAROXABAN 15 MG PO TABS: 15.0000 mg | ORAL_TABLET | Freq: Two times a day (BID) | ORAL | Status: DC

## 2012-08-16 MED ORDER — COUMADIN BOOK
Freq: Once | Status: AC
  Administered 2012-08-16: 18:00:00
  Filled 2012-08-16: qty 1

## 2012-08-16 MED ORDER — HEPARIN (PORCINE) IN NACL 100-0.45 UNIT/ML-% IJ SOLN
700.0000 [IU]/h | INTRAMUSCULAR | Status: DC
  Filled 2012-08-16 (×3): qty 250

## 2012-08-16 MED ORDER — WARFARIN - PHARMACIST DOSING INPATIENT
Freq: Every day | Status: DC
  Administered 2012-08-17: 18:00:00

## 2012-08-16 MED ORDER — WARFARIN VIDEO: Freq: Once | Status: DC

## 2012-08-16 MED ORDER — WARFARIN SODIUM 5 MG PO TABS
5.0000 mg | ORAL_TABLET | Freq: Once | ORAL | Status: AC
  Administered 2012-08-16: 5 mg via ORAL
  Filled 2012-08-16: qty 1

## 2012-08-16 NOTE — Progress Notes (Signed)
ANTICOAGULATION CONSULT NOTE - Initial Consult  Pharmacy Consult:  Coumadin Indication:  B/L DVTs  Allergies  Allergen Reactions  . Lactose Intolerance (Gi) Diarrhea and Nausea Only    Patient Measurements: Height: 5\' 1"  (154.9 cm) Weight: 126 lb 3.2 oz (57.244 kg) IBW/kg (Calculated) : 47.8   Vital Signs: Temp: 97.3 F (36.3 C) (01/01 1421) Temp src: Oral (01/01 1421) BP: 96/66 mmHg (01/01 1421) Pulse Rate: 96  (01/01 1421)  Labs:  Basename 08/16/12 1612 08/16/12 1037 08/16/12 0230 08/16/12 0031 08/15/12 0555 08/14/12 1744  HGB -- -- 12.7 -- 12.7 --  HCT -- -- 37.6 -- 36.8 40.8  PLT -- -- 117* -- 109* 124*  APTT -- -- -- -- -- --  LABPROT 14.5 -- -- -- -- --  INR 1.15 -- -- -- -- --  HEPARINUNFRC -- 0.56 1.52* >2.00* -- --  CREATININE -- -- 1.45* -- 1.31* 1.57*  CKTOTAL -- -- -- -- -- --  CKMB -- -- -- -- -- --  TROPONINI -- -- -- -- -- --    Estimated Creatinine Clearance: 23.7 ml/min (by C-G formula based on Cr of 1.45).      Assessment: [name] with bilateral DVTs already started on IV heparin and now to start Coumadin therapy.  Baseline INR at 1.15.  RN reported IV heparin gtt was held around 1445 due to infiltration.  Patient also bled but has since stopped.  IV replaced and heparin gtt was resumed around 1630.   Goal of Therapy:  INR 2-3 Monitor platelets by anticoagulation protocol: Yes    Plan:  - Coumadin 5mg  PO today - Daily PT / INR  - F/U 8 hr HL post heparin gtt resumed - Coumadin book / video     [name] D. Laney Potash, PharmD, BCPS Pager:  [phone] 08/16/2012, 5:29 PM

## 2012-08-16 NOTE — Progress Notes (Addendum)
[facility] PROGRESS NOTE  JADI DEYARMIN YNW:[id number] DOB: 1931-02-22 DOA: 08/14/2012 PCP: [name], MD  Brief History: 79 year old female with PMH most significant for blindness, HTN, CAD, Stroke, CKD stage III, hyperlipidemia and arthritis comes in today with chief complaints of abdominal pain since last 10 days. Pain started in the entire abdomen but localized to lower abdomen, medium in intensity, non radiating, no exacerbation or relieving factors. Patient also started having cough with shortness of breath about 5 days prior to admission. Patient is usually bed bound and not very functional due to her disability. Patient also endorses sweating since last 5 days. No sputum noted but patient vomited once soon after drinking prune juice. No chest pain, no headaches, no palpitations, no swelling in extremities, no orthopnea or PND.    Assessment/Plan: Healthcare associated pneumonia  -was in hospital <90 days ago -Discontinued azithromycin and ceftriaxone  -Started vancomycin and Zosyn 08/15/12 -D/C vancomycin today 08/16/12--blood cultures neg x 2d -Influenza PCR--neg -Continue IV fluids--hyponatremia improving--poor by mouth intake Abdominal pain  -improved--likely constipation--now having loose stools -check cdiff -CT abdomen pelvis--no acute abnormalities except for left common iliac and left common femoral vein DVT noted -Tolerating diet but eating little -Note abdominal pain has been a chronic problem even during her last admission Pyuria  -Followup urine culture--GNR--await final susceptibilities -Continue empiric Zosyn--previous UTI with pseudomonas in October -No signs of pyelonephritis on CT abdomen Bilateral lower extremity DVT  -Cannot use xarelto do to creatinine clearance<30 -Started Coumadin, pharmacy to help dose -Continue heparin bridge -V/Q scan low probability Leukocytosis -Multifactorial -Slight increase today but remains afebrile and  hemodynamically stable Atrial fibrillation  -Patient remained in sinus at this time  Acute on chronic renal failure/CKD stage III-IV  -Serum creatinine back to baseline with intravenous fluids  -Baseline creatinine 1.4-1.6  History of temporal arteritis  -Continue long-term low-dose prednisone  History of NSTEMI -continue Zebeta      Family Communication:  Spoke with son Disposition Plan:   Home when medically stable      Procedures/Studies: Ct Abdomen Pelvis Wo Contrast  08/15/2012  *RADIOLOGY REPORT*  Clinical Data: Diffuse abdominal pain for 10 days.  CT ABDOMEN AND PELVIS WITHOUT CONTRAST  Technique:  Multidetector CT imaging of the abdomen and pelvis was performed following the standard protocol without intravenous contrast.  Comparison: None.  Findings: Lung bases shows some scarring in the right middle lobe.  14 mm lesion in the anterior hepatic dome is probably a cyst. Similar 10 mm well-defined low density lesion in the central right liver is also compatible with a cyst.  Spleen has normal uninfused features.  There is a tiny hiatal hernia.  The stomach is unremarkable.  Duodenal diverticulum noted.  Pancreas and adrenal glands are normal.  Multiple calcified stones are seen in the gallbladder lumen.  The largest stone measures up to about 7 mm in diameter.  No associated biliary dilatation or pericholecystic edema.  Kidneys are unremarkable without renal stones or secondary changes.  No abdominal aortic aneurysm.  No free fluid or lymphadenopathy in the abdomen.  Imaging through the pelvis shows some edema within the retroperitoneal tissues of the left pelvic sidewall tracking along the left common iliac artery and vein. The left common iliac vein is enlarged relative to the right to in the wall is ill-defined. That it has higher attenuation than the other adjacent vascular structures.  This abnormal appearance tracks down to the left common femoral vein and visualized portion of  the  upper left superficial femoral vein. Presacral edema/inflammation is evident, presumably from the thrombus in the left pelvic veins.  The oral contrast material has migrated through to the level of the rectal vault.  There is prominent diverticular change in the sigmoid colon but no definite findings of diverticulitis.  No pelvic abscess.  Terminal ileum is normal.  The appendix is not visualized, but there is no edema or inflammation in the region of the cecum.  Uterus is surgically absent no evidence for adnexal mass.  Bone windows reveal no worrisome lytic or sclerotic osseous lesions. .  IMPRESSION: Changes in the left common and external iliac veins as well as the left common femoral vein are compatible with DVT. According EPIC, this is a known finding as a pharmacy consult note at 1446 hours today is for " 79 year old female with bilateral DVT to start on heparin".  Cholelithiasis.  Left colonic diverticulosis without diverticulitis.   Original Report Authenticated By: Kennith Center, M.D.    Dg Chest 2 View  08/14/2012  *RADIOLOGY REPORT*  Clinical Data: Chest pain  CHEST - 2 VIEW  Comparison: 06/07/2012  Findings: Normal heart size.  No effusion or edema peri right midlung opacity is identified measuring 1.9 cm.  New from previous exam.  Left lung is clear.  IMPRESSION:  1.  Indeterminant opacity within the right midlung.  In the acute setting this may represent a focal area of pneumonia.  Radiographic followup is recommended to ensure resolution.  If this does not resolve then CT of the chest should be considered.   Original Report Authenticated By: Signa Kell, M.D.    Nm Pulmonary Perf And Vent  08/15/2012  *RADIOLOGY REPORT*  Clinical data:  Acute DVT.  Chest pain, shortness of breath. Companion chest radiograph from the previous day shows an ill- defined peripheral right midlung opacity, otherwise clear.  VENTILATION PERFUSION SCINTIGRAPHY   Comparison:  06/08/2012  indings: Ventilation  imaging with 40 mCi technetium 96m DTPA inhaled shows large quantity of central deposition, poor peripheral activity.  No washout images.   Perfusion imaging with 3.40mCi Tc95m MAA IV shows improvement in the previously noted moderate subsegmental lingular, lateral basal   left lower lobe, posterior left upper lobe, and posterior right upper lobe perfusion defects. No new segmental or subsegmental perfusion defects to suggest interval PE.  IMPRESSION 1.  Low likelihood ratio for interval acute pulmonary embolism.   Original Report Authenticated By: D. Andria Rhein, MD          Subjective:  patient is feeling better today. She denies any chest pain, shortness of breath, vomiting, nausea, abdominal pain, dysuria. She is having some loose stools. No rashes.  Objective: Filed Vitals:   08/15/12 1710 08/15/12 1951 08/16/12 0422 08/16/12 1421  BP: 115/78 109/63 119/70 96/66  Pulse: 88 73 86 96  Temp: 99.1 F (37.3 C) 98.2 F (36.8 C) 99.1 F (37.3 C) 97.3 F (36.3 C)  TempSrc: Oral Oral Oral Oral  Resp: 18 18 18 16   Height:      Weight:      SpO2: 95% 95% 97% 100%    Intake/Output Summary (Last 24 hours) at 08/16/12 1512 Last data filed at 08/16/12 1200  Gross per 24 hour  Intake    360 ml  Output      0 ml  Net    360 ml   Weight change:  Exam:   General:  Pt is alert, follows commands appropriately, not in acute distress  HEENT: No  icterus, No thrush,  [place]/AT  Cardiovascular: RRR, S1/S2, no rubs, no gallops  Respiratory: bibasilar crackles. No wheezes or rhonchi. Good air movement. Left clear to auscultation  Abdomen: Soft/+BS, non tender, non distended, no guarding  Extremities: 1+ edema, No lymphangitis, No petechiae, No rashes, no synovitis  Data Reviewed: Basic Metabolic Panel:  Lab 08/16/12 1610 08/15/12 0555 08/14/12 1744  NA 135 131* 132*  K 4.3 4.0 4.6  CL 99 98 96  CO2 20 17* 22  GLUCOSE 124* 117* 103*  BUN 19 19 23   CREATININE 1.45* 1.31* 1.57*    CALCIUM 8.9 8.7 9.6  MG -- -- --  PHOS -- -- --   Liver Function Tests:  Lab 08/14/12 1744  AST 19  ALT 12  ALKPHOS 47  BILITOT 0.3  PROT 8.2  ALBUMIN 2.9*   No results found for this basename: LIPASE:5,AMYLASE:5 in the last 168 hours No results found for this basename: AMMONIA:5 in the last 168 hours CBC:  Lab 08/16/12 0230 08/15/12 0555 08/14/12 1744  WBC 13.6* 12.3* 11.3*  NEUTROABS 9.1* -- 6.8  HGB 12.7 12.7 13.7  HCT 37.6 36.8 40.8  MCV 93.5 93.2 94.7  PLT 117* 109* 124*   Cardiac Enzymes: No results found for this basename: CKTOTAL:5,CKMB:5,CKMBINDEX:5,TROPONINI:5 in the last 168 hours BNP: No components found with this basename: POCBNP:5 CBG: No results found for this basename: GLUCAP:5 in the last 168 hours  Recent Results (from the past 240 hour(s))  URINE CULTURE     Status: Normal (Preliminary result)   Collection Time   08/14/12  7:47 PM      Component Value Range Status Comment   Specimen Description URINE, CLEAN CATCH   Final    Special Requests NONE   Final    Culture  Setup Time 08/15/2012 03:43   Final    Colony Count >=100,000 COLONIES/ML   Final    Culture GRAM NEGATIVE RODS   Final    Report Status PENDING   Incomplete   CULTURE, BLOOD (ROUTINE X 2)     Status: Normal (Preliminary result)   Collection Time   08/14/12  9:45 PM      Component Value Range Status Comment   Specimen Description BLOOD ARM LEFT   Final    Special Requests BOTTLES DRAWN AEROBIC AND ANAEROBIC 10CC   Final    Culture  Setup Time 08/15/2012 05:07   Final    Culture     Final    Value:        BLOOD CULTURE RECEIVED NO GROWTH TO DATE CULTURE WILL BE HELD FOR 5 DAYS BEFORE ISSUING A FINAL NEGATIVE REPORT   Report Status PENDING   Incomplete   CULTURE, BLOOD (ROUTINE X 2)     Status: Normal (Preliminary result)   Collection Time   08/14/12  9:55 PM      Component Value Range Status Comment   Specimen Description BLOOD ARM LEFT   Final    Special Requests BOTTLES DRAWN  AEROBIC AND ANAEROBIC 10CC   Final    Culture  Setup Time 08/15/2012 05:07   Final    Culture     Final    Value:        BLOOD CULTURE RECEIVED NO GROWTH TO DATE CULTURE WILL BE HELD FOR 5 DAYS BEFORE ISSUING A FINAL NEGATIVE REPORT   Report Status PENDING   Incomplete      Scheduled Meds:   . bisoprolol  5 mg Oral Daily  .  brimonidine  1 drop Both Eyes BID  . brinzolamide  1 drop Both Eyes Daily  . docusate sodium  100 mg Oral BID  . escitalopram  5 mg Oral Daily  . ezetimibe  10 mg Oral Daily  . levETIRAcetam  250 mg Oral BID  . pantoprazole  40 mg Oral Daily  . piperacillin-tazobactam  3.375 g Intravenous Q8H  . predniSONE  5 mg Oral Q breakfast  . rivaroxaban  15 mg Oral BID  . sodium chloride  3 mL Intravenous Q12H  . timolol  1 drop Both Eyes BID   Continuous Infusions:   . sodium chloride 75 mL/hr (08/14/12 2312)  . heparin 700 Units/hr (08/16/12 0508)     Shaunita Seney, DO  [facility] Pager [phone]  If 7PM-7AM, please contact night-coverage [email or web address] Password [email or web address] 08/16/2012, 3:12 PM   LOS: 2 days

## 2012-08-16 NOTE — Progress Notes (Signed)
ANTICOAGULATION CONSULT NOTE - Follow up  Pharmacy Consult for heparin Indication: DVT  Allergies  Allergen Reactions  . Lactose Intolerance (Gi) Diarrhea and Nausea Only    Patient Measurements: Height: 5\' 1"  (154.9 cm) Weight: 126 lb 3.2 oz (57.244 kg) IBW/kg (Calculated) : 47.8    Vital Signs: Temp: 98.2 F (36.8 C) (12/31 1951) Temp src: Oral (12/31 1951) BP: 109/63 mmHg (12/31 1951) Pulse Rate: 73  (12/31 1951)  Labs:  Basename 08/16/12 0230 08/16/12 0031 08/15/12 0555 08/14/12 1744  HGB 12.7 -- 12.7 --  HCT 37.6 -- 36.8 40.8  PLT 117* -- 109* 124*  APTT -- -- -- --  LABPROT -- -- -- --  INR -- -- -- --  HEPARINUNFRC 1.52* >2.00* -- --  CREATININE -- -- 1.31* 1.57*  CKTOTAL -- -- -- --  CKMB -- -- -- --  TROPONINI -- -- -- --    Estimated Creatinine Clearance: 26.3 ml/min (by C-G formula based on Cr of 1.31).   Medical History: Past Medical History  Diagnosis Date  . Arthritis   . Hypertension   . Coronary artery disease     a. Neg stress test 2008. b. ?questionable history - pt thought she had heart cath in 1970's but details were unclear.  . Stroke     a. Details unclear.  . Seizures     a. Due to hyponatremia, resolved.  Marland Kitchen History of gout   . Glaucoma   . Cataracts, bilateral   . Acute kidney injury   . CKD (chronic kidney disease), stage III   . Hyperlipidemia   . Gallstones   . Cerebrovascular disease   . Hyponatremia     a. H/o 2011 ?SIADH with seizures, encephalopathy at that time.  . Temporal arteritis   . Cardiac arrest 05/2012    x 2, thought due to renal failure and hypoK  . Dementia 06/19/2012    Medications:  Prescriptions prior to admission  Medication Sig Dispense Refill  . acetaminophen (TYLENOL) 325 MG tablet Take 325 mg by mouth daily as needed.      . bisoprolol (ZEBETA) 5 MG tablet Take 1 tablet (5 mg total) by mouth daily.      . brimonidine-timolol (COMBIGAN) 0.2-0.5 % ophthalmic solution Place 1 drop into both eyes  every 12 (twelve) hours.      . brinzolamide (AZOPT) 1 % ophthalmic suspension Place 1 drop into both eyes daily.      Marland Kitchen escitalopram (LEXAPRO) 5 MG tablet Take 5 mg by mouth daily.      Marland Kitchen ezetimibe (ZETIA) 10 MG tablet Take 1 tablet (10 mg total) by mouth daily.  30 tablet  0  . levETIRAcetam (KEPPRA) 250 MG tablet Take 250 mg by mouth 2 (two) times daily.       . pantoprazole (PROTONIX) 40 MG tablet Take 40 mg by mouth daily.      . predniSONE (DELTASONE) 10 MG tablet Take 5 mg by mouth daily.        Assessment: Heparin level =1.52 on IV heparin 900 units/hr in this 79 yo female with bilateral DVT. Level is above desired range. (2nd  Level was drawn from arm opposite of heparin infusing.  1st level drawn reported as >2 was drawn from same arm as heparin infusing, above heparin site).  No bleeding noted per RN's report.   Goal of Therapy:  Heparin level 0.3-0.7 units/ml Monitor platelets by anticoagulation protocol: Yes   Plan:  Hold Heparin drip x 1  hour then restart at reduced rate of 700 units/hr. Heparin level 6 hours after restart of heparin.   Noah Delaine, RPh Clinical Pharmacist Pager: [phone]  08/16/2012 4:00 AM

## 2012-08-16 NOTE — Progress Notes (Signed)
ANTICOAGULATION CONSULT NOTE - Follow up  Pharmacy Consult for heparin Indication: DVT  Allergies  Allergen Reactions  . Lactose Intolerance (Gi) Diarrhea and Nausea Only    Patient Measurements: Height: 5\' 1"  (154.9 cm) Weight: 126 lb 3.2 oz (57.244 kg) IBW/kg (Calculated) : 47.8    Vital Signs: Temp: 99.1 F (37.3 C) (01/01 0422) Temp src: Oral (01/01 0422) BP: 119/70 mmHg (01/01 0422) Pulse Rate: 86  (01/01 0422)  Labs:  Basename 08/16/12 1037 08/16/12 0230 08/16/12 0031 08/15/12 0555 08/14/12 1744  HGB -- 12.7 -- 12.7 --  HCT -- 37.6 -- 36.8 40.8  PLT -- 117* -- 109* 124*  APTT -- -- -- -- --  LABPROT -- -- -- -- --  INR -- -- -- -- --  HEPARINUNFRC 0.56 1.52* >2.00* -- --  CREATININE -- 1.45* -- 1.31* 1.57*  CKTOTAL -- -- -- -- --  CKMB -- -- -- -- --  TROPONINI -- -- -- -- --    Estimated Creatinine Clearance: 23.7 ml/min (by C-G formula based on Cr of 1.45).   Medical History: Past Medical History  Diagnosis Date  . Arthritis   . Hypertension   . Coronary artery disease     a. Neg stress test 2008. b. ?questionable history - pt thought she had heart cath in 1970's but details were unclear.  . Stroke     a. Details unclear.  . Seizures     a. Due to hyponatremia, resolved.  Marland Kitchen History of gout   . Glaucoma   . Cataracts, bilateral   . Acute kidney injury   . CKD (chronic kidney disease), stage III   . Hyperlipidemia   . Gallstones   . Cerebrovascular disease   . Hyponatremia     a. H/o 2011 ?SIADH with seizures, encephalopathy at that time.  . Temporal arteritis   . Cardiac arrest 05/2012    x 2, thought due to renal failure and hypoK  . Dementia 06/19/2012    Medications:  Prescriptions prior to admission  Medication Sig Dispense Refill  . acetaminophen (TYLENOL) 325 MG tablet Take 325 mg by mouth daily as needed.      . bisoprolol (ZEBETA) 5 MG tablet Take 1 tablet (5 mg total) by mouth daily.      . brimonidine-timolol (COMBIGAN) 0.2-0.5  % ophthalmic solution Place 1 drop into both eyes every 12 (twelve) hours.      . brinzolamide (AZOPT) 1 % ophthalmic suspension Place 1 drop into both eyes daily.      Marland Kitchen escitalopram (LEXAPRO) 5 MG tablet Take 5 mg by mouth daily.      Marland Kitchen ezetimibe (ZETIA) 10 MG tablet Take 1 tablet (10 mg total) by mouth daily.  30 tablet  0  . levETIRAcetam (KEPPRA) 250 MG tablet Take 250 mg by mouth 2 (two) times daily.       . pantoprazole (PROTONIX) 40 MG tablet Take 40 mg by mouth daily.      . predniSONE (DELTASONE) 10 MG tablet Take 5 mg by mouth daily.        Assessment: Heparin  in this 79 yo female with bilateral DVT.  HL today is therapeutic.  No bleeding noted.  Goal of Therapy:  Heparin level 0.3-0.7 units/ml Monitor platelets by anticoagulation protocol: Yes   Plan:  Continue heparin at 700 units/hr. Heparin level in 8 hours to confirm.   Talbert Cage, PharmD Clinical Pharmacist Pager: [phone]  08/16/2012 1:28 PM

## 2012-08-17 DIAGNOSIS — R109 Unspecified abdominal pain: Secondary | ICD-10-CM

## 2012-08-17 LAB — BASIC METABOLIC PANEL
BUN: 18 mg/dL (ref 6–23)
Creatinine, Ser: 1.58 mg/dL — ABNORMAL HIGH (ref 0.50–1.10)
GFR calc Af Amer: 35 mL/min — ABNORMAL LOW (ref >90)
GFR calc non Af Amer: 30 mL/min — ABNORMAL LOW (ref >90)
Potassium: 3.7 mEq/L (ref 3.5–5.1)

## 2012-08-17 LAB — CBC
HCT: 32.3 % — ABNORMAL LOW (ref 36.0–46.0)
MCHC: 33.4 g/dL (ref 30.0–36.0)
Platelets: 138 10*3/uL — ABNORMAL LOW (ref 150–400)
RDW: 17.4 % — ABNORMAL HIGH (ref 11.5–15.5)

## 2012-08-17 LAB — PROTIME-INR: Prothrombin Time: 16.2 seconds — ABNORMAL HIGH (ref 11.6–15.2)

## 2012-08-17 LAB — HEPARIN LEVEL (UNFRACTIONATED)
Heparin Unfractionated: 0.54 IU/mL (ref 0.30–0.70)
Heparin Unfractionated: 0.54 IU/mL (ref 0.30–0.70)

## 2012-08-17 MED ORDER — WARFARIN SODIUM 4 MG PO TABS
4.0000 mg | ORAL_TABLET | Freq: Once | ORAL | Status: AC
  Administered 2012-08-17: 4 mg via ORAL
  Filled 2012-08-17: qty 1

## 2012-08-17 MED ORDER — LEVOFLOXACIN 500 MG PO TABS
500.0000 mg | ORAL_TABLET | ORAL | Status: AC
  Administered 2012-08-17 – 2012-08-19 (×2): 500 mg via ORAL
  Filled 2012-08-17 (×2): qty 1

## 2012-08-17 MED ORDER — ENOXAPARIN SODIUM 60 MG/0.6ML ~~LOC~~ SOLN
60.0000 mg | SUBCUTANEOUS | Status: DC
  Administered 2012-08-17 – 2012-08-19 (×3): 60 mg via SUBCUTANEOUS
  Filled 2012-08-17 (×3): qty 0.6

## 2012-08-17 MED ORDER — POLYETHYLENE GLYCOL 3350 17 G PO PACK
17.0000 g | PACK | Freq: Every day | ORAL | Status: DC | PRN
  Filled 2012-08-17: qty 1

## 2012-08-17 NOTE — Progress Notes (Signed)
[facility] PROGRESS NOTE  [name] ZOX:[id number] DOB: 09-20-1930 DOA: 08/14/2012 PCP: [name], MD  Brief History: 79 year old female with PMH most significant for blindness, HTN, CAD, Stroke, CKD stage III, hyperlipidemia and arthritis comes in today with chief complaints of abdominal pain since last 10 days. Pain started in the entire abdomen but localized to lower abdomen, medium in intensity, non radiating, no exacerbation or relieving factors. Patient also started having cough with shortness of breath about 5 days prior to admission. Patient is usually bed bound and not very functional due to her disability. Patient also endorses sweating since last 5 days. No sputum noted but patient vomited once soon after drinking prune juice. No chest pain, no headaches, no palpitations, no swelling in extremities, no orthopnea or PND.    Assessment/Plan: Healthcare associated pneumonia  -Was in hospital <90 days ago -Discontinued azithromycin and ceftriaxone. Transition vancomycin and Zosyn to levofloxacin today for 5 more days (total of 8 day course of antibiotics). -Influenza PCR--neg.  Urinary tract infection due to Klebsiella pneumonia/Pyuria  -Urine culture growing Klebsiella pneumonia sensitive to levofloxacin (for 5 more days).  Antibiotics changed as indicated above.  Discontinue Zosyn. -No signs of pyelonephritis on CT abdomen.  Hyponatremia Resolved with IV fluids.  Abdominal pain  -Improved--likely constipation--now having loose stools, likely due to overflow diarrhea from constipation.  Suspect cdiff PCR, will be of low yield.  If she has any worsening diarrhea check C. difficile PCR. -CT abdomen pelvis--no acute abnormalities except for left common iliac and left common femoral vein DVT noted -Note abdominal pain has been a chronic problem even during her last admission.  Malnutrition -Tolerating diet, still has poor by mouth intake.  Will request dietary  consultation.  Bilateral lower extremity DVT  -Cannot use xarelto do to creatinine clearance<30 -Continue Coumadin dosing per pharmacy. -Transitioned to Lovenox. -V/Q scan low probability.  Leukocytosis -Multifactorial -Slight increase today but remains afebrile and hemodynamically stable  Atrial fibrillation  -Patient remained in sinus at this time   Acute on chronic renal failure/CKD stage III-IV  -Serum creatinine back to baseline with intravenous fluids, saline lock IV fluids.  -Baseline creatinine 1.4-1.6   History of temporal arteritis  -Continue long-term low-dose prednisone   History of NSTEMI -Continue Zebeta  Generalized weakness Physical therapy recommending home health physical therapy.  Family Communication:  Spoke with son at bedside. Disposition Plan:  Consider discharge in the next one to 2 days.  Antibiotics: Ceftriaxone 08/14/2012 Vancomycin 08/14/2012 >> 08/15/2012 Zosyn 08/15/2012 >> 08/17/2012 Levofloxacin 08/17/2012 >>    Procedures/Studies: Ct Abdomen Pelvis Wo Contrast  08/15/2012  *RADIOLOGY REPORT*  Clinical Data: Diffuse abdominal pain for 10 days.  CT ABDOMEN AND PELVIS WITHOUT CONTRAST  Technique:  Multidetector CT imaging of the abdomen and pelvis was performed following the standard protocol without intravenous contrast.  Comparison: None.  Findings: Lung bases shows some scarring in the right middle lobe.  14 mm lesion in the anterior hepatic dome is probably a cyst. Similar 10 mm well-defined low density lesion in the central right liver is also compatible with a cyst.  Spleen has normal uninfused features.  There is a tiny hiatal hernia.  The stomach is unremarkable.  Duodenal diverticulum noted.  Pancreas and adrenal glands are normal.  Multiple calcified stones are seen in the gallbladder lumen.  The largest stone measures up to about 7 mm in diameter.  No associated biliary dilatation or pericholecystic edema.  Kidneys are unremarkable  without renal stones  or secondary changes.  No abdominal aortic aneurysm.  No free fluid or lymphadenopathy in the abdomen.  Imaging through the pelvis shows some edema within the retroperitoneal tissues of the left pelvic sidewall tracking along the left common iliac artery and vein. The left common iliac vein is enlarged relative to the right to in the wall is ill-defined. That it has higher attenuation than the other adjacent vascular structures.  This abnormal appearance tracks down to the left common femoral vein and visualized portion of the upper left superficial femoral vein. Presacral edema/inflammation is evident, presumably from the thrombus in the left pelvic veins.  The oral contrast material has migrated through to the level of the rectal vault.  There is prominent diverticular change in the sigmoid colon but no definite findings of diverticulitis.  No pelvic abscess.  Terminal ileum is normal.  The appendix is not visualized, but there is no edema or inflammation in the region of the cecum.  Uterus is surgically absent no evidence for adnexal mass.  Bone windows reveal no worrisome lytic or sclerotic osseous lesions. .  IMPRESSION: Changes in the left common and external iliac veins as well as the left common femoral vein are compatible with DVT. According EPIC, this is a known finding as a pharmacy consult note at 1446 hours today is for " 79 year old female with bilateral DVT to start on heparin".  Cholelithiasis.  Left colonic diverticulosis without diverticulitis.   Original Report Authenticated By: Kennith Center, M.D.    Dg Chest 2 View  08/14/2012  *RADIOLOGY REPORT*  Clinical Data: Chest pain  CHEST - 2 VIEW  Comparison: 06/07/2012  Findings: Normal heart size.  No effusion or edema peri right midlung opacity is identified measuring 1.9 cm.  New from previous exam.  Left lung is clear.  IMPRESSION:  1.  Indeterminant opacity within the right midlung.  In the acute setting this may represent  a focal area of pneumonia.  Radiographic followup is recommended to ensure resolution.  If this does not resolve then CT of the chest should be considered.   Original Report Authenticated By: Signa Kell, M.D.    Nm Pulmonary Perf And Vent  08/15/2012  *RADIOLOGY REPORT*  Clinical data:  Acute DVT.  Chest pain, shortness of breath. Companion chest radiograph from the previous day shows an ill- defined peripheral right midlung opacity, otherwise clear.  VENTILATION PERFUSION SCINTIGRAPHY   Comparison:  06/08/2012  indings: Ventilation imaging with 40 mCi technetium 39m DTPA inhaled shows large quantity of central deposition, poor peripheral activity.  No washout images.   Perfusion imaging with 3.32mCi Tc50m MAA IV shows improvement in the previously noted moderate subsegmental lingular, lateral basal   left lower lobe, posterior left upper lobe, and posterior right upper lobe perfusion defects. No new segmental or subsegmental perfusion defects to suggest interval PE.  IMPRESSION 1.  Low likelihood ratio for interval acute pulmonary embolism.   Original Report Authenticated By: D. Andria Rhein, MD     Subjective: Still feeling weak but improved from admission.  Still having some loose stools at times.  Objective: Filed Vitals:   08/16/12 0422 08/16/12 1421 08/16/12 2007 08/17/12 0451  BP: 119/70 96/66 132/79 130/84  Pulse: 86 96 79 83  Temp: 99.1 F (37.3 C) 97.3 F (36.3 C) 98.8 F (37.1 C) 99.3 F (37.4 C)  TempSrc: Oral Oral Oral Oral  Resp: 18 16 18 16   Height:      Weight:      SpO2: 97%  100% 92% 96%    Intake/Output Summary (Last 24 hours) at 08/17/12 1245 Last data filed at 08/17/12 0800  Gross per 24 hour  Intake     75 ml  Output    301 ml  Net   -226 ml   Weight change:  Exam:   General:  Pt is alert, follows commands appropriately, not in acute distress  HEENT: No icterus, No thrush,  [place]/AT  Cardiovascular: RRR, S1/S2, no rubs, no gallops  Respiratory: bibasilar  crackles. No wheezes or rhonchi. Good air movement. Left clear to auscultation  Abdomen: Soft/+BS, non tender, non distended, no guarding  Extremities: 1+ edema, No lymphangitis, No petechiae, No rashes, no synovitis  Data Reviewed: Basic Metabolic Panel:  Lab 08/17/12 1610 08/16/12 0230 08/15/12 0555 08/14/12 1744  NA 137 135 131* 132*  K 3.7 4.3 4.0 4.6  CL 105 99 98 96  CO2 18* 20 17* 22  GLUCOSE 98 124* 117* 103*  BUN 18 19 19 23   CREATININE 1.58* 1.45* 1.31* 1.57*  CALCIUM 8.7 8.9 8.7 9.6  MG -- -- -- --  PHOS -- -- -- --   Liver Function Tests:  Lab 08/14/12 1744  AST 19  ALT 12  ALKPHOS 47  BILITOT 0.3  PROT 8.2  ALBUMIN 2.9*   No results found for this basename: LIPASE:5,AMYLASE:5 in the last 168 hours No results found for this basename: AMMONIA:5 in the last 168 hours CBC:  Lab 08/17/12 0558 08/16/12 0230 08/15/12 0555 08/14/12 1744  WBC 10.4 13.6* 12.3* 11.3*  NEUTROABS -- 9.1* -- 6.8  HGB 10.8* 12.7 12.7 13.7  HCT 32.3* 37.6 36.8 40.8  MCV 94.4 93.5 93.2 94.7  PLT 138* 117* 109* 124*   Cardiac Enzymes: No results found for this basename: CKTOTAL:5,CKMB:5,CKMBINDEX:5,TROPONINI:5 in the last 168 hours BNP: No components found with this basename: POCBNP:5 CBG: No results found for this basename: GLUCAP:5 in the last 168 hours  Recent Results (from the past 240 hour(s))  URINE CULTURE     Status: Normal   Collection Time   08/14/12  7:47 PM      Component Value Range Status Comment   Specimen Description URINE, CLEAN CATCH   Final    Special Requests NONE   Final    Culture  Setup Time 08/15/2012 03:43   Final    Colony Count >=100,000 COLONIES/ML   Final    Culture KLEBSIELLA PNEUMONIAE   Final    Report Status 08/16/2012 FINAL   Final    Organism ID, Bacteria KLEBSIELLA PNEUMONIAE   Final   CULTURE, BLOOD (ROUTINE X 2)     Status: Normal (Preliminary result)   Collection Time   08/14/12  9:45 PM      Component Value Range Status Comment    Specimen Description BLOOD ARM LEFT   Final    Special Requests BOTTLES DRAWN AEROBIC AND ANAEROBIC 10CC   Final    Culture  Setup Time 08/15/2012 05:07   Final    Culture     Final    Value:        BLOOD CULTURE RECEIVED NO GROWTH TO DATE CULTURE WILL BE HELD FOR 5 DAYS BEFORE ISSUING A FINAL NEGATIVE REPORT   Report Status PENDING   Incomplete   CULTURE, BLOOD (ROUTINE X 2)     Status: Normal (Preliminary result)   Collection Time   08/14/12  9:55 PM      Component Value Range Status Comment   Specimen Description BLOOD  ARM LEFT   Final    Special Requests BOTTLES DRAWN AEROBIC AND ANAEROBIC 10CC   Final    Culture  Setup Time 08/15/2012 05:07   Final    Culture     Final    Value:        BLOOD CULTURE RECEIVED NO GROWTH TO DATE CULTURE WILL BE HELD FOR 5 DAYS BEFORE ISSUING A FINAL NEGATIVE REPORT   Report Status PENDING   Incomplete      Scheduled Meds:    . bisoprolol  5 mg Oral Daily  . brimonidine  1 drop Both Eyes BID  . brinzolamide  1 drop Both Eyes Daily  . enoxaparin (LOVENOX) injection  60 mg Subcutaneous Q24H  . escitalopram  5 mg Oral Daily  . ezetimibe  10 mg Oral Daily  . levETIRAcetam  250 mg Oral BID  . pantoprazole  40 mg Oral Daily  . piperacillin-tazobactam  3.375 g Intravenous Q8H  . predniSONE  5 mg Oral Q breakfast  . sodium chloride  3 mL Intravenous Q12H  . timolol  1 drop Both Eyes BID  . warfarin  4 mg Oral ONCE-1800  . warfarin   Does not apply Once  . Warfarin - Pharmacist Dosing Inpatient   Does not apply q1800   Continuous Infusions:    . sodium chloride 75 mL/hr at 08/16/12 1616     Edon Hoadley A, MD  [facility] Pager [phone]  If 7PM-7AM, please contact night-coverage [email or web address] Password [email or web address] 08/17/2012, 12:45 PM   LOS: 3 days

## 2012-08-17 NOTE — Progress Notes (Signed)
ANTIBIOTIC CONSULT NOTE - INITIAL  Pharmacy Consult for levaquin Indication: pneumonia  Allergies  Allergen Reactions  . Lactose Intolerance (Gi) Diarrhea and Nausea Only    Patient Measurements: Height: 5\' 1"  (154.9 cm) Weight: 126 lb 3.2 oz (57.244 kg) IBW/kg (Calculated) : 47.8   Vital Signs: Temp: 97.1 F (36.2 C) (01/02 1445) Temp src: Oral (01/02 1445) BP: 119/68 mmHg (01/02 1445) Pulse Rate: 73  (01/02 1445) Intake/Output from previous day: 01/01 0701 - 01/02 0700 In: 360 [P.O.:360] Out: -  Intake/Output from this shift: Total I/O In: 195 [P.O.:195] Out: 301 [Urine:300; Stool:1]  Labs:  [facility] 08/17/12 0558 08/16/12 0230 08/15/12 0555  WBC 10.4 13.6* 12.3*  HGB 10.8* 12.7 12.7  PLT 138* 117* 109*  LABCREA -- -- --  CREATININE 1.58* 1.45* 1.31*   Estimated Creatinine Clearance: 21.8 ml/min (by C-G formula based on Cr of 1.58). No results found for this basename: VANCOTROUGH:2,VANCOPEAK:2,VANCORANDOM:2,GENTTROUGH:2,GENTPEAK:2,GENTRANDOM:2,TOBRATROUGH:2,TOBRAPEAK:2,TOBRARND:2,AMIKACINPEAK:2,AMIKACINTROU:2,AMIKACIN:2, in the last 72 hours   Microbiology: Recent Results (from the past 720 hour(s))  URINE CULTURE     Status: Normal   Collection Time   08/14/12  7:47 PM      Component Value Range Status Comment   Specimen Description URINE, CLEAN CATCH   Final    Special Requests NONE   Final    Culture  Setup Time 08/15/2012 03:43   Final    Colony Count >=100,000 COLONIES/ML   Final    Culture KLEBSIELLA PNEUMONIAE   Final    Report Status 08/16/2012 FINAL   Final    Organism ID, Bacteria KLEBSIELLA PNEUMONIAE   Final   CULTURE, BLOOD (ROUTINE X 2)     Status: Normal (Preliminary result)   Collection Time   08/14/12  9:45 PM      Component Value Range Status Comment   Specimen Description BLOOD ARM LEFT   Final    Special Requests BOTTLES DRAWN AEROBIC AND ANAEROBIC 10CC   Final    Culture  Setup Time 08/15/2012 05:07   Final    Culture     Final    Value:        BLOOD CULTURE RECEIVED NO GROWTH TO DATE CULTURE WILL BE HELD FOR 5 DAYS BEFORE ISSUING A FINAL NEGATIVE REPORT   Report Status PENDING   Incomplete   CULTURE, BLOOD (ROUTINE X 2)     Status: Normal (Preliminary result)   Collection Time   08/14/12  9:55 PM      Component Value Range Status Comment   Specimen Description BLOOD ARM LEFT   Final    Special Requests BOTTLES DRAWN AEROBIC AND ANAEROBIC 10CC   Final    Culture  Setup Time 08/15/2012 05:07   Final    Culture     Final    Value:        BLOOD CULTURE RECEIVED NO GROWTH TO DATE CULTURE WILL BE HELD FOR 5 DAYS BEFORE ISSUING A FINAL NEGATIVE REPORT   Report Status PENDING   Incomplete     Medical History: Past Medical History  Diagnosis Date  . Arthritis   . Hypertension   . Coronary artery disease     a. Neg stress test 2008. b. ?questionable history - pt thought she had heart cath in 1970's but details were unclear.  . Stroke     a. Details unclear.  . Seizures     a. Due to hyponatremia, resolved.  Marland Kitchen History of gout   . Glaucoma   . Cataracts, bilateral   .  Acute kidney injury   . CKD (chronic kidney disease), stage III   . Hyperlipidemia   . Gallstones   . Cerebrovascular disease   . Hyponatremia     a. H/o 2011 ?SIADH with seizures, encephalopathy at that time.  . Temporal arteritis   . Cardiac arrest 05/2012    x 2, thought due to renal failure and hypoK  . Dementia 06/19/2012    Medications:  Scheduled:    . bisoprolol  5 mg Oral Daily  . brimonidine  1 drop Both Eyes BID  . brinzolamide  1 drop Both Eyes Daily  . [COMPLETED] coumadin book   Does not apply Once  . enoxaparin (LOVENOX) injection  60 mg Subcutaneous Q24H  . escitalopram  5 mg Oral Daily  . ezetimibe  10 mg Oral Daily  . levETIRAcetam  250 mg Oral BID  . pantoprazole  40 mg Oral Daily  . predniSONE  5 mg Oral Q breakfast  . sodium chloride  3 mL Intravenous Q12H  . timolol  1 drop Both Eyes BID  . warfarin  4 mg Oral  ONCE-1800  . [COMPLETED] warfarin  5 mg Oral ONCE-1800  . warfarin   Does not apply Once  . Warfarin - Pharmacist Dosing Inpatient   Does not apply q1800  . [DISCONTINUED] piperacillin-tazobactam  3.375 g Intravenous Q8H   Assessment: 79 yo who was admitted for HCAP. Has been on vanc/zosyn empirically. WBC has normalized. Now changing to levaquin for 5 days to complete course. Her renal function is trending up.  Plan:  Levaquin 500mg  PO q48 Will not anticipate any further dosing changes Rx will sign off  Ulyses Southward [place] 08/17/2012,4:47 PM

## 2012-08-17 NOTE — Progress Notes (Signed)
Physical Therapy Treatment Patient Details Name: [name] MRN: [id number] DOB: [birth year] Today's Date: 08/17/2012 Time: 1140-1200 PT Time Calculation (min): 20 min  PT Assessment / Plan / Recommendation Comments on Treatment Session  Pt s/p PNA and bil DVT's with decr mobility secondary to decr endurance and weakness secondary to bedrest.  Pt needs continued PT to assist her in returning to baseline status.  Has assist at home.  Agree with HHPT f/u.      Follow Up Recommendations  Home health PT;Supervision for mobility/OOB                 Equipment Recommendations  None recommended by PT        Frequency Min 3X/week   Plan Discharge plan remains appropriate;Frequency remains appropriate    Precautions / Restrictions Precautions Precautions: Fall Restrictions Weight Bearing Restrictions: No   Pertinent Vitals/Pain VSS, some bil foot pain    Mobility  Bed Mobility Bed Mobility: Supine to Sit;Sitting - Scoot to Edge of Bed;Sit to Supine (Simultaneous filing. User may not have seen previous data.) Rolling Left: 4: Min guard Left Sidelying to Sit: 4: Min assist;HOB elevated Supine to Sit: 4: Min assist;HOB elevated (Simultaneous filing. User may not have seen previous data.) Sitting - Scoot to Edge of Bed: 4: Min guard (Simultaneous filing. User may not have seen previous data.) Sit to Supine: 4: Min assist;HOB flat (Simultaneous filing. User may not have seen previous data.) Details for Bed Mobility Assistance: mild struggle to scoot to EOB; pt guarded likely from visual deficits (Simultaneous filing. User may not have seen previous data.) Transfers Transfers: Sit to Stand;Stand to Sit Sit to Stand: 4: Min assist;With upper extremity assist;From bed (Simultaneous filing. User may not have seen previous data.) Stand to Sit: 4: Min assist;With upper extremity assist;To bed (Simultaneous filing. User may not have seen previous data.) Details for Transfer Assistance:  v/tc's for hand placement; steadying assist (Simultaneous filing. User may not have seen previous data.) Ambulation/Gait Ambulation/Gait Assistance: 4: Min assist Ambulation Distance (Feet): 20 Feet Assistive device: Rolling walker Ambulation/Gait Assistance Details: needed cues to maneuver RW as well as cues to stay close to RW.  Forward flexed posture throughout without much effort to correct the posture even though cued constantly.   Gait Pattern: Step-to pattern;Decreased stride length;Shuffle;Trunk flexed;Wide base of support Gait velocity: decreased Stairs: No Wheelchair Mobility Wheelchair Mobility: No    PT Goals Acute Rehab PT Goals PT Goal: Supine/Side to Sit - Progress: Progressing toward goal PT Goal: Sit to Stand - Progress: Progressing toward goal PT Transfer Goal: Bed to Chair/Chair to Bed - Progress: Progressing toward goal PT Goal: Ambulate - Progress: Progressing toward goal  Visit Information  Last PT Received On: 08/17/12 Assistance Needed: +1    Subjective Data  Subjective: "I will try to walk."   Cognition  Overall Cognitive Status: Appears within functional limits for tasks assessed/performed Arousal/Alertness: Awake/alert Orientation Level: Appears intact for tasks assessed Behavior During Session: [facility] for tasks performed    Balance  Balance Balance Assessed: Yes Static Sitting Balance Static Sitting - Balance Support: No upper extremity supported;Feet supported Static Sitting - Level of Assistance: 5: Stand by assistance Static Sitting - Comment/# of Minutes: 2  End of Session PT - End of Session Equipment Utilized During Treatment: Gait belt Activity Tolerance: Patient tolerated treatment well Patient left: in chair;with call bell/phone within reach;with family/visitor present Nurse Communication: Mobility status       INGOLD,Beth Spackman 08/17/2012, 12:25 PM  Kourtnie Sachs  [place],PT Acute Rehabilitation [phone] (pager)

## 2012-08-17 NOTE — Progress Notes (Addendum)
ANTICOAGULATION CONSULT NOTE - Follow Up Consult  Pharmacy Consult for Heparin Indication: Bilateral DVTs  Allergies  Allergen Reactions  . Lactose Intolerance (Gi) Diarrhea and Nausea Only    Patient Measurements: Height: 5\' 1"  (154.9 cm) Weight: 126 lb 3.2 oz (57.244 kg) IBW/kg (Calculated) : 47.8  Heparin Dosing Weight: 57.24 kg  Vital Signs: Temp: 99.3 F (37.4 C) (01/02 0451) Temp src: Oral (01/02 0451) BP: 130/84 mmHg (01/02 0451) Pulse Rate: 83  (01/02 0451)  Labs:  Basename 08/17/12 1000 08/17/12 0558 08/17/12 0030 08/16/12 1612 08/16/12 1037 08/16/12 0230 08/15/12 0555  HGB -- 10.8* -- -- -- 12.7 --  HCT -- 32.3* -- -- -- 37.6 36.8  PLT -- 138* -- -- -- 117* 109*  APTT -- -- -- -- -- -- --  LABPROT -- 16.2* -- 14.5 -- -- --  INR -- 1.33 -- 1.15 -- -- --  HEPARINUNFRC 0.54 -- 0.54 -- 0.56 -- --  CREATININE -- 1.58* -- -- -- 1.45* 1.31*  CKTOTAL -- -- -- -- -- -- --  CKMB -- -- -- -- -- -- --  TROPONINI -- -- -- -- -- -- --    Estimated Creatinine Clearance: 21.8 ml/min (by C-G formula based on Cr of 1.58).   Medications:  Infusions:     . sodium chloride 75 mL/hr at 08/16/12 1616  . heparin 700 Units/hr (08/16/12 1616)    Assessment: 79 yo female with HCAP  Anticoagulation: B DVT (bed-bound). VQ unlikely PE. Heparin level 0.54 in goal. INR 1.33 (up from 1.15 baseline). Hgb down to 10.8.  Infectious Disease: HCAP/UTI Tmax 99.3. WBC 10.4 down. Abx narrowed to Zosyn (started 12/31) 12/31 BC: pending 12/31 UC>> Klebsiella Pneumoniae Sens to Zosyn  Cardiovascular: HTN, HLD, CAD. 130/84, HR 83 on bisoprolol, Zetia Endocrinology: Prednisone for temproal arteritis Gastrointestinal / Nutrition: po PPI Neurology: CVA, seizure d/o, dementia on Lexapro, Keppra Nephrology: CKD SrCr 1.58 (up a little) Pulmonary: RA Hematology / Oncology: Arthritis, temporal arteritis on prednisone, blindness PTA Medication Issues: none Best Practices: Heparin, ppi  Goal  of Therapy:  Heparin level 0.3-0.7 units/ml INR 2-3 Monitor platelets by anticoagulation protocol: Yes   Plan:  Continue IV heparin at 700 units/hr Coumadin 4mg  po x 1 tonight  Adden: 1145 Dr. Betti Cruz would like to transition pt to Lovenox for discharge preparation. Convert to 60mg  once daily (for CrCl<30).   Quirino Kakos S. Merilynn Finland, PharmD, [facility] Clinical Staff Pharmacist Pager [phone]  08/17/2012 11:02 AM

## 2012-08-17 NOTE — Progress Notes (Signed)
ANTICOAGULATION CONSULT NOTE - Follow Up Consult  Pharmacy Consult for Heparin Indication: Bilateral DVTs  Allergies  Allergen Reactions  . Lactose Intolerance (Gi) Diarrhea and Nausea Only    Patient Measurements: Height: 5\' 1"  (154.9 cm) Weight: 126 lb 3.2 oz (57.244 kg) IBW/kg (Calculated) : 47.8  Heparin Dosing Weight: 57.24 kg  Vital Signs: Temp: 98.8 F (37.1 C) (01/01 2007) Temp src: Oral (01/01 2007) BP: 132/79 mmHg (01/01 2007) Pulse Rate: 79  (01/01 2007)  Labs:  Basename 08/17/12 0030 08/16/12 1612 08/16/12 1037 08/16/12 0230 08/15/12 0555 08/14/12 1744  HGB -- -- -- 12.7 12.7 --  HCT -- -- -- 37.6 36.8 40.8  PLT -- -- -- 117* 109* 124*  APTT -- -- -- -- -- --  LABPROT -- 14.5 -- -- -- --  INR -- 1.15 -- -- -- --  HEPARINUNFRC 0.54 -- 0.56 1.52* -- --  CREATININE -- -- -- 1.45* 1.31* 1.57*  CKTOTAL -- -- -- -- -- --  CKMB -- -- -- -- -- --  TROPONINI -- -- -- -- -- --    Estimated Creatinine Clearance: 23.7 ml/min (by C-G formula based on Cr of 1.45).   Medications:  Infusions:    . sodium chloride 75 mL/hr at 08/16/12 1616  . heparin 700 Units/hr (08/16/12 1616)    Assessment: 79 y.o. F on heparin for bilateral DVTs with a therapeutic heparin level this morning after resuming the drip yesterday evening. (HL 0.54, goal of 0.3-0.7). No s/sx of bleeding noted, and no problems with the IV site per nurse report.  Goal of Therapy:  Heparin level 0.3-0.7 units/ml Monitor platelets by anticoagulation protocol: Yes   Plan:  1. Continue heparin at 700 units/hr (7 ml/hr) 2. Will continue to monitor for any signs/symptoms of bleeding and will follow up with heparin level in 8 hours to confirm therapeutic  Georgina Pillion, PharmD, BCPS Clinical Pharmacist Pager: [phone] 08/17/2012 4:05 AM

## 2012-08-17 NOTE — Evaluation (Signed)
Occupational Therapy Evaluation Patient Details Name: [name] MRN: [id number] DOB: [birth year] Today's Date: 08/17/2012 Time: 5784-6962 OT Time Calculation (min): 18 min  OT Assessment / Plan / Recommendation Clinical Impression  This 79 y.o. female with h/o blindness admitted with abdominal pain.  pt. found to have bil. DVTs.  Son was primary caregiver and will continue at discharge.  She presents to OT with the below listed deficits and will benefit from continued OT to maximize safety and independence with BADLs to allow her to return home at supervision to min A level.    OT Assessment  Patient needs continued OT Services    Follow Up Recommendations  Supervision/Assistance - 24 hour;No OT follow up    Barriers to Discharge None    Equipment Recommendations  None recommended by OT    Recommendations for Other Services    Frequency  Min 2X/week    Precautions / Restrictions Precautions Precautions: Fall Restrictions Weight Bearing Restrictions: No       ADL  Eating/Feeding: Minimal assistance Where Assessed - Eating/Feeding: Chair Grooming: Wash/dry hands;Wash/dry face;Teeth care;Minimal assistance Where Assessed - Grooming: Supported sitting Upper Body Bathing: Minimal assistance Where Assessed - Upper Body Bathing: Supported sitting Lower Body Bathing: Moderate assistance Where Assessed - Lower Body Bathing: Supported sit to stand Upper Body Dressing: Moderate assistance Where Assessed - Upper Body Dressing: Unsupported sitting Lower Body Dressing: Moderate assistance Where Assessed - Lower Body Dressing: Supported sit to Pharmacist, hospital: Minimal Dentist Method: Sit to Barista: Comfort height toilet Toileting - Clothing Manipulation and Hygiene: Moderate assistance Where Assessed - Toileting Clothing Manipulation and Hygiene: Standing Transfers/Ambulation Related to ADLs: Pt ambulates short distances (~74ft)  with min A.  Requires directional cues due to sight impairment ADL Comments: Pt fatigues quickly, and sight impairment impacts her ability to perform BADLs due to unfamiliar environment    OT Diagnosis: Generalized weakness;Blindness and low vision  OT Problem List: Decreased strength;Decreased activity tolerance;Impaired balance (sitting and/or standing);Impaired vision/perception;Pain OT Treatment Interventions: Self-care/ADL training;Therapeutic activities;Patient/family education;Balance training   OT Goals Acute Rehab OT Goals OT Goal Formulation: With patient Time For Goal Achievement: 08/31/12 Potential to Achieve Goals: Good ADL Goals Pt Will Perform Grooming: with supervision;Standing at sink ADL Goal: Grooming - Progress: Goal set today Pt Will Perform Lower Body Bathing: with supervision;Sit to stand from chair;Sit to stand from bed ADL Goal: Lower Body Bathing - Progress: Goal set today Pt Will Perform Lower Body Dressing: with supervision;Sit to stand from chair;Sit to stand from bed ADL Goal: Lower Body Dressing - Progress: Goal set today Pt Will Transfer to Toilet: with supervision;Ambulation;3-in-1;Stand pivot transfer ADL Goal: Toilet Transfer - Progress: Goal set today Pt Will Perform Toileting - Clothing Manipulation: with supervision;Standing ADL Goal: Toileting - Clothing Manipulation - Progress: Goal set today  Visit Information  Last OT Received On: 08/17/12 Assistance Needed: +1 PT/OT Co-Evaluation/Treatment: Yes    Subjective Data  Subjective: "Oh, I can't do much.  My legs don't let me do much.  They just hurt" Patient Stated Goal: To go home   Prior Functioning     Home Living Lives With: Alone;Other (Comment) (Son provides 24 hour care) Available Help at Discharge: Family;Personal care attendant (son is caregiver; PCA 2-3 times/wk 2-4 hrs/day) Type of Home: House Home Access: Stairs to enter [employer] of Steps: 1 Home Layout: One  level Bathroom Shower/Tub: Tub/shower unit;Curtain Firefighter: Standard Home Adaptive Equipment: Grab bars in shower;Grab bars around toilet;Bedside  commode/3-in-1;Shower chair with back;Walker - rolling;Straight cane Additional Comments: Pt reports she sponge bathes Prior Function Level of Independence: Needs assistance;Independent with assistive device(s) Able to Take Stairs?: No Driving: No Comments: Pt reports variable level of assist PTA. Pt also reports she use BSC predominantly  Communication Communication: No difficulties Dominant Hand: Right         Vision/Perception     Cognition  Overall Cognitive Status: Appears within functional limits for tasks assessed/performed Arousal/Alertness: Awake/alert Orientation Level: Appears intact for tasks assessed Behavior During Session: [facility] for tasks performed    Extremity/Trunk Assessment Right Upper Extremity Assessment RUE ROM/Strength/Tone: Within functional levels RUE Coordination: WFL - gross/fine motor Left Upper Extremity Assessment LUE ROM/Strength/Tone: Within functional levels LUE Coordination: WFL - gross/fine motor Trunk Assessment Trunk Assessment: Normal     Mobility Bed Mobility Bed Mobility: Supine to Sit;Sitting - Scoot to Edge of Bed;Sit to Supine (Simultaneous filing. User may not have seen previous data.) Rolling Left: 4: Min guard Left Sidelying to Sit: 4: Min assist;HOB elevated Supine to Sit: 4: Min assist;HOB elevated (Simultaneous filing. User may not have seen previous data.) Sitting - Scoot to Edge of Bed: 4: Min guard (Simultaneous filing. User may not have seen previous data.) Sit to Supine: 4: Min assist;HOB flat (Simultaneous filing. User may not have seen previous data.) Details for Bed Mobility Assistance: mild struggle to scoot to EOB; pt guarded likely from visual deficits (Simultaneous filing. User may not have seen previous data.) Transfers Transfers: Sit to Stand;Stand to Sit Sit  to Stand: 4: Min assist;With upper extremity assist;From bed (Simultaneous filing. User may not have seen previous data.) Stand to Sit: 4: Min assist;With upper extremity assist;To bed (Simultaneous filing. User may not have seen previous data.) Details for Transfer Assistance: v/tc's for hand placement; steadying assist (Simultaneous filing. User may not have seen previous data.)     Shoulder Instructions     Exercise     Balance Balance Balance Assessed: Yes Static Sitting Balance Static Sitting - Balance Support: No upper extremity supported;Feet supported Static Sitting - Level of Assistance: 5: Stand by assistance Static Sitting - Comment/# of Minutes: 2   End of Session OT - End of Session Activity Tolerance: Patient limited by fatigue Patient left: in chair;with call bell/phone within reach;with family/visitor present Nurse Communication: Mobility status  GO     Homar Weinkauf M 08/17/2012, 12:31 PM

## 2012-08-18 DIAGNOSIS — H409 Unspecified glaucoma: Secondary | ICD-10-CM

## 2012-08-18 DIAGNOSIS — F329 Major depressive disorder, single episode, unspecified: Secondary | ICD-10-CM

## 2012-08-18 DIAGNOSIS — F0390 Unspecified dementia without behavioral disturbance: Secondary | ICD-10-CM

## 2012-08-18 DIAGNOSIS — K219 Gastro-esophageal reflux disease without esophagitis: Secondary | ICD-10-CM

## 2012-08-18 LAB — CBC
HCT: 34.1 % — ABNORMAL LOW (ref 36.0–46.0)
MCHC: 33.7 g/dL (ref 30.0–36.0)
MCV: 94.2 fL (ref 78.0–100.0)
Platelets: 184 10*3/uL (ref 150–400)
RDW: 17.1 % — ABNORMAL HIGH (ref 11.5–15.5)

## 2012-08-18 LAB — BASIC METABOLIC PANEL
BUN: 16 mg/dL (ref 6–23)
Creatinine, Ser: 1.55 mg/dL — ABNORMAL HIGH (ref 0.50–1.10)
GFR calc Af Amer: 36 mL/min — ABNORMAL LOW (ref >90)
GFR calc non Af Amer: 31 mL/min — ABNORMAL LOW (ref >90)

## 2012-08-18 MED ORDER — ENSURE COMPLETE PO LIQD
237.0000 mL | Freq: Two times a day (BID) | ORAL | Status: DC
  Administered 2012-08-18 – 2012-08-19 (×3): 237 mL via ORAL

## 2012-08-18 MED ORDER — WARFARIN 0.5 MG HALF TABLET
0.5000 mg | ORAL_TABLET | Freq: Once | ORAL | Status: AC
  Administered 2012-08-18: 0.5 mg via ORAL
  Filled 2012-08-18: qty 1

## 2012-08-18 NOTE — Progress Notes (Signed)
INITIAL NUTRITION ASSESSMENT  DOCUMENTATION CODES Per approved criteria  -Non-severe (moderate) malnutrition in the context of chronic illness   INTERVENTION:  Ensure Complete supplement 2 times daily between meals (350 kcals, 13 gm protein per 8 fl oz bottle) RD to follow for nutrition care plan  NUTRITION DIAGNOSIS: Inadequate oral intake related to poor appetite, acute illness as evidenced by PO intake 10-25%  Goal: Oral intake with meals & supplements to meet >/= 90% of estimated nutrition needs  Monitor:  PO & supplemental intake, weight, labs, I/O's  Reason for Assessment: Consult  79 y.o. female  Admitting Dx: Community acquired pneumonia  ASSESSMENT: Patient admitted with chief complaints of abdominal pain; also started having cough a/w shortness of breath ---> HCAP; patient states her appetite "isn't that great;" per weight records, patient has lost 22 lbs since 01/2012 (15%).  Per family, patient was only consuming 2 meals per day PTA; PO intake at 10-25% per flowsheet records; observed Ensure Complete supplement on patient's tray table; is drinking ---> will order in Order Management.  Patient meets criteria for non-severe (moderate) malnutrition in the context of chronic illness given < 75% intake of estimated energy requirement for > 1 month and 15% weight loss in > 6 months.  Height: Ht Readings from Last 1 Encounters:  08/14/12 5\' 1"  (1.549 m)    Weight: Wt Readings from Last 1 Encounters:  08/14/12 126 lb 3.2 oz (57.244 kg)    Ideal Body Weight: 47.7 kg  % Ideal Body Weight: 119%  Wt Readings from Last 10 Encounters:  08/14/12 126 lb 3.2 oz (57.244 kg)  07/12/12 122 lb (55.339 kg)  06/14/12 135 lb 9.3 oz (61.5 kg)  05/26/12 136 lb 11 oz (62 kg)  05/20/12 131 lb (59.421 kg)  02/11/12 143 lb 8 oz (65.091 kg)  01/18/12 148 lb (67.132 kg)    Usual Body Weight: 67.2 kg  % Usual Body Weight: 85%  BMI:  Body mass index is 23.85  kg/(m^2).  Estimated Nutritional Needs: Kcal: 1400-1600 Protein: 65-75 gm Fluid: > 1.5 L  Skin: Intact  Diet Order: Carb Control  EDUCATION NEEDS: -No education needs identified at this time   Intake/Output Summary (Last 24 hours) at 08/18/12 1407 Last data filed at 08/18/12 0436  Gross per 24 hour  Intake      0 ml  Output    302 ml  Net   -302 ml    Last BM: 1/2  Labs:   Lab 08/18/12 0550 08/17/12 0558 08/16/12 0230  NA 136 137 135  K 4.1 3.7 4.3  CL 103 105 99  CO2 19 18* 20  BUN 16 18 19   CREATININE 1.55* 1.58* 1.45*  CALCIUM 9.6 8.7 8.9  MG -- -- --  PHOS -- -- --  GLUCOSE 91 98 124*     Scheduled Meds:   . bisoprolol  5 mg Oral Daily  . brimonidine  1 drop Both Eyes BID  . brinzolamide  1 drop Both Eyes Daily  . enoxaparin (LOVENOX) injection  60 mg Subcutaneous Q24H  . escitalopram  5 mg Oral Daily  . ezetimibe  10 mg Oral Daily  . levETIRAcetam  250 mg Oral BID  . levofloxacin  500 mg Oral Q48H  . pantoprazole  40 mg Oral Daily  . predniSONE  5 mg Oral Q breakfast  . sodium chloride  3 mL Intravenous Q12H  . timolol  1 drop Both Eyes BID  . warfarin  0.5 mg Oral ONCE-1800  .  warfarin   Does not apply Once  . Warfarin - Pharmacist Dosing Inpatient   Does not apply q1800    Continuous Infusions:   Past Medical History  Diagnosis Date  . Arthritis   . Hypertension   . Coronary artery disease     a. Neg stress test 2008. b. ?questionable history - pt thought she had heart cath in 1970's but details were unclear.  . Stroke     a. Details unclear.  . Seizures     a. Due to hyponatremia, resolved.  Marland Kitchen History of gout   . Glaucoma   . Cataracts, bilateral   . Acute kidney injury   . CKD (chronic kidney disease), stage III   . Hyperlipidemia   . Gallstones   . Cerebrovascular disease   . Hyponatremia     a. H/o 2011 ?SIADH with seizures, encephalopathy at that time.  . Temporal arteritis   . Cardiac arrest 05/2012    x 2, thought due  to renal failure and hypoK  . Dementia 06/19/2012    Past Surgical History  Procedure Date  . Eye surgery   . Abdominal hysterectomy   . Coronary angioplasty with stent placement     Kirkland Hun, RD, LDN Pager #: [phone] After-Hours Pager #: [phone]

## 2012-08-18 NOTE — Progress Notes (Signed)
ANTICOAGULATION CONSULT NOTE - Follow Up Consult  Pharmacy Consult for Lovenox/Coumadin Indication: Bilateral DVTs  Allergies  Allergen Reactions  . Lactose Intolerance (Gi) Diarrhea and Nausea Only    Patient Measurements: Height: 5\' 1"  (154.9 cm) Weight: 126 lb 3.2 oz (57.244 kg) IBW/kg (Calculated) : 47.8  Heparin Dosing Weight: 57.24 kg  Vital Signs: Temp: 98.7 F (37.1 C) (01/03 0426) Temp src: Oral (01/03 0426) BP: 140/81 mmHg (01/03 0426) Pulse Rate: 81  (01/03 0426)  Labs:  Basename 08/18/12 0550 08/17/12 1000 08/17/12 0558 08/17/12 0030 08/16/12 1612 08/16/12 1037 08/16/12 0230  HGB 11.5* -- 10.8* -- -- -- --  HCT 34.1* -- 32.3* -- -- -- 37.6  PLT 184 -- 138* -- -- -- 117*  APTT -- -- -- -- -- -- --  LABPROT 20.8* -- 16.2* -- 14.5 -- --  INR 1.87* -- 1.33 -- 1.15 -- --  HEPARINUNFRC -- 0.54 -- 0.54 -- 0.56 --  CREATININE 1.55* -- 1.58* -- -- -- 1.45*  CKTOTAL -- -- -- -- -- -- --  CKMB -- -- -- -- -- -- --  TROPONINI -- -- -- -- -- -- --    Estimated Creatinine Clearance: 22.2 ml/min (by C-G formula based on Cr of 1.55).   Medications:  Infusions:     . [DISCONTINUED] sodium chloride Stopped (08/17/12 1729)  . [DISCONTINUED] heparin Stopped (08/17/12 1305)    Assessment: 79 yo female with HCAP  Anticoagulation: B DVT (bed-bound). VQ unlikely PE. INR up to 1.87 (up from 1.15 baseline). Hgb 11.5. Platelets up to 184.  Infectious Disease: HCAP/UTI Tmax 99.1. WBC 10.4 down. Abx narrowed to Levaquin (started 1/2) 12/31 BC: pending 12/31 UC>> Klebsiella Pneumoniae Sens to Zosyn  Cardiovascular: HTN, HLD, CAD. 140/81, HR 81 on bisoprolol, Zetia Endocrinology: Prednisone for temproal arteritis Gastrointestinal / Nutrition: po PPI. Having some diarrhea (was constipated) Neurology: CVA, seizure d/o, dementia on Lexapro, Keppra Nephrology: CKD SrCr 1.55 Pulmonary: RA Hematology / Oncology: Arthritis, temporal arteritis on prednisone, blindness PTA  Medication Issues: none Best Practices: Heparin, ppi  Goal of Therapy:  LMWH level 0.6-1.2 (4hrs post-dose) INR 2-3 Monitor platelets by anticoagulation protocol: Yes   Plan:  Lovenox 60mg  once daily (for CrCl<30). Coumadin 0.5mg  po x 1 tonight.    Chesni Vos S. Merilynn Finland, PharmD, [facility] Clinical Staff Pharmacist Pager [phone]  08/18/2012 10:47 AM

## 2012-08-18 NOTE — Progress Notes (Signed)
Patient ID: [name]  female  JYN:[id number]    DOB: 04/10/1931    DOA: 08/14/2012  PCP: [name], MD  Assessment/Plan: Principal Problem:  Healthcare associated pneumonia  - Vanc and zosyn Dc'ed,  On levofloxacin today for 4 more days (total of 8 day course of antibiotics).  - Influenza PCR--neg.   Active problems Urinary tract infection due to Klebsiella pneumonia/Pyuria  -Urine culture growing Klebsiella pneumonia sensitive to levofloxacin (for 4 more days).  -No signs of pyelonephritis on CT abdomen.   Hyponatremia  Resolved with IV fluids.   Abdominal pain: Improved, chronic problem -CT abdomen pelvis--no acute abnormalities except for left common iliac and left common femoral vein DVT noted   Malnutrition  -Tolerating diet, still has poor by mouth intake.   Bilateral lower extremity DVT  -V/Q scan low probability.  - on lovenox and coumadin, INR 1.87 today  Atrial fibrillation  -Patient remained in sinus at this time, on coumadin   Acute on chronic renal failure/CKD stage III-IV  -Serum creatinine back to baseline with intravenous fluids, saline lock IV fluids.  -Baseline creatinine 1.4-1.6   History of temporal arteritis  -Continue long-term low-dose prednisone   History of NSTEMI  -Continue Zebeta, zetia   Generalized weakness  Physical therapy recommending home health physical therapy.   DVT Prophylaxis: on lovenox and coumadin  Code Status: Full  Disposition: hopefully tomorrow    Subjective: Feels weak, poor appetite, but otherwise, no CP, SOB, vomiting    Objective: Weight change:   Intake/Output Summary (Last 24 hours) at 08/18/12 0933 Last data filed at 08/18/12 0436  Gross per 24 hour  Intake    120 ml  Output    302 ml  Net   -182 ml   Blood pressure 140/81, pulse 81, temperature 98.7 F (37.1 C), temperature source Oral, resp. rate 18, height 5\' 1"  (1.549 m), weight 57.244 kg (126 lb 3.2 oz), SpO2 98.00%.  Physical  Exam: General: Alert and awake, oriented x3, not in any acute distress. HEENT: anicteric sclera, pupils reactive to light and accommodation, EOMI CVS: S1-S2 clear, no murmur rubs or gallops Chest: decrease BS at bases Abdomen: soft nontender, nondistended, normal bowel sounds, no organomegaly Extremities: no cyanosis, clubbing.trace edema noted bilaterally Neuro: Cranial nerves II-XII intact, no focal neurological deficits  Lab Results: Basic Metabolic Panel:  Lab 08/18/12 8657 08/17/12 0558  NA 136 137  K 4.1 3.7  CL 103 105  CO2 19 18*  GLUCOSE 91 98  BUN 16 18  CREATININE 1.55* 1.58*  CALCIUM 9.6 8.7  MG -- --  PHOS -- --   Liver Function Tests:  Lab 08/14/12 1744  AST 19  ALT 12  ALKPHOS 47  BILITOT 0.3  PROT 8.2  ALBUMIN 2.9*   No results found for this basename: LIPASE:2,AMYLASE:2 in the last 168 hours No results found for this basename: AMMONIA:2 in the last 168 hours CBC:  Lab 08/18/12 0550 08/17/12 0558 08/16/12 0230  WBC 10.4 10.4 --  NEUTROABS -- -- 9.1*  HGB 11.5* 10.8* --  HCT 34.1* 32.3* --  MCV 94.2 94.4 --  PLT 184 138* --   Cardiac Enzymes: No results found for this basename: CKTOTAL:3,CKMB:3,CKMBINDEX:3,TROPONINI:3 in the last 168 hours BNP: No components found with this basename: POCBNP:2 CBG: No results found for this basename: GLUCAP:5 in the last 168 hours   Micro Results: Recent Results (from the past 240 hour(s))  URINE CULTURE     Status: Normal   Collection  Time   08/14/12  7:47 PM      Component Value Range Status Comment   Specimen Description URINE, CLEAN CATCH   Final    Special Requests NONE   Final    Culture  Setup Time 08/15/2012 03:43   Final    Colony Count >=100,000 COLONIES/ML   Final    Culture KLEBSIELLA PNEUMONIAE   Final    Report Status 08/16/2012 FINAL   Final    Organism ID, Bacteria KLEBSIELLA PNEUMONIAE   Final   CULTURE, BLOOD (ROUTINE X 2)     Status: Normal (Preliminary result)   Collection Time    08/14/12  9:45 PM      Component Value Range Status Comment   Specimen Description BLOOD ARM LEFT   Final    Special Requests BOTTLES DRAWN AEROBIC AND ANAEROBIC 10CC   Final    Culture  Setup Time 08/15/2012 05:07   Final    Culture     Final    Value:        BLOOD CULTURE RECEIVED NO GROWTH TO DATE CULTURE WILL BE HELD FOR 5 DAYS BEFORE ISSUING A FINAL NEGATIVE REPORT   Report Status PENDING   Incomplete   CULTURE, BLOOD (ROUTINE X 2)     Status: Normal (Preliminary result)   Collection Time   08/14/12  9:55 PM      Component Value Range Status Comment   Specimen Description BLOOD ARM LEFT   Final    Special Requests BOTTLES DRAWN AEROBIC AND ANAEROBIC 10CC   Final    Culture  Setup Time 08/15/2012 05:07   Final    Culture     Final    Value:        BLOOD CULTURE RECEIVED NO GROWTH TO DATE CULTURE WILL BE HELD FOR 5 DAYS BEFORE ISSUING A FINAL NEGATIVE REPORT   Report Status PENDING   Incomplete     Studies/Results: Ct Abdomen Pelvis Wo Contrast  08/15/2012  *RADIOLOGY REPORT*  Clinical Data: Diffuse abdominal pain for 10 days.  CT ABDOMEN AND PELVIS WITHOUT CONTRAST  Technique:  Multidetector CT imaging of the abdomen and pelvis was performed following the standard protocol without intravenous contrast.  Comparison: None.  Findings: Lung bases shows some scarring in the right middle lobe.  14 mm lesion in the anterior hepatic dome is probably a cyst. Similar 10 mm well-defined low density lesion in the central right liver is also compatible with a cyst.  Spleen has normal uninfused features.  There is a tiny hiatal hernia.  The stomach is unremarkable.  Duodenal diverticulum noted.  Pancreas and adrenal glands are normal.  Multiple calcified stones are seen in the gallbladder lumen.  The largest stone measures up to about 7 mm in diameter.  No associated biliary dilatation or pericholecystic edema.  Kidneys are unremarkable without renal stones or secondary changes.  No abdominal aortic  aneurysm.  No free fluid or lymphadenopathy in the abdomen.  Imaging through the pelvis shows some edema within the retroperitoneal tissues of the left pelvic sidewall tracking along the left common iliac artery and vein. The left common iliac vein is enlarged relative to the right to in the wall is ill-defined. That it has higher attenuation than the other adjacent vascular structures.  This abnormal appearance tracks down to the left common femoral vein and visualized portion of the upper left superficial femoral vein. Presacral edema/inflammation is evident, presumably from the thrombus in the left pelvic veins.  The oral  contrast material has migrated through to the level of the rectal vault.  There is prominent diverticular change in the sigmoid colon but no definite findings of diverticulitis.  No pelvic abscess.  Terminal ileum is normal.  The appendix is not visualized, but there is no edema or inflammation in the region of the cecum.  Uterus is surgically absent no evidence for adnexal mass.  Bone windows reveal no worrisome lytic or sclerotic osseous lesions. .  IMPRESSION: Changes in the left common and external iliac veins as well as the left common femoral vein are compatible with DVT. According EPIC, this is a known finding as a pharmacy consult note at 1446 hours today is for " 79 year old female with bilateral DVT to start on heparin".  Cholelithiasis.  Left colonic diverticulosis without diverticulitis.   Original Report Authenticated By: Kennith Center, M.D.    Dg Chest 2 View  08/14/2012  *RADIOLOGY REPORT*  Clinical Data: Chest pain  CHEST - 2 VIEW  Comparison: 06/07/2012  Findings: Normal heart size.  No effusion or edema peri right midlung opacity is identified measuring 1.9 cm.  New from previous exam.  Left lung is clear.  IMPRESSION:  1.  Indeterminant opacity within the right midlung.  In the acute setting this may represent a focal area of pneumonia.  Radiographic followup is recommended  to ensure resolution.  If this does not resolve then CT of the chest should be considered.   Original Report Authenticated By: Signa Kell, M.D.    Nm Pulmonary Perf And Vent  08/15/2012  *RADIOLOGY REPORT*  Clinical data:  Acute DVT.  Chest pain, shortness of breath. Companion chest radiograph from the previous day shows an ill- defined peripheral right midlung opacity, otherwise clear.  VENTILATION PERFUSION SCINTIGRAPHY   Comparison:  06/08/2012  indings: Ventilation imaging with 40 mCi technetium 61m DTPA inhaled shows large quantity of central deposition, poor peripheral activity.  No washout images.   Perfusion imaging with 3.18mCi Tc84m MAA IV shows improvement in the previously noted moderate subsegmental lingular, lateral basal   left lower lobe, posterior left upper lobe, and posterior right upper lobe perfusion defects. No new segmental or subsegmental perfusion defects to suggest interval PE.  IMPRESSION 1.  Low likelihood ratio for interval acute pulmonary embolism.   Original Report Authenticated By: D. Andria Rhein, MD     Medications: Scheduled Meds:   . bisoprolol  5 mg Oral Daily  . brimonidine  1 drop Both Eyes BID  . brinzolamide  1 drop Both Eyes Daily  . enoxaparin (LOVENOX) injection  60 mg Subcutaneous Q24H  . escitalopram  5 mg Oral Daily  . ezetimibe  10 mg Oral Daily  . levETIRAcetam  250 mg Oral BID  . levofloxacin  500 mg Oral Q48H  . pantoprazole  40 mg Oral Daily  . predniSONE  5 mg Oral Q breakfast  . sodium chloride  3 mL Intravenous Q12H  . timolol  1 drop Both Eyes BID  . warfarin   Does not apply Once  . Warfarin - Pharmacist Dosing Inpatient   Does not apply q1800      LOS: 4 days   Carnella Fryman M.D. [facility] 08/18/2012, 9:33 AM Pager: [phone]  If 7PM-7AM, please contact night-coverage [email or web address] Password [email or web address]

## 2012-08-19 MED ORDER — LEVOFLOXACIN 500 MG PO TABS: 500.0000 mg | ORAL_TABLET | ORAL | Status: DC

## 2012-08-19 MED ORDER — ENOXAPARIN SODIUM 60 MG/0.6ML ~~LOC~~ SOLN: 60.0000 mg | Freq: Every day | SUBCUTANEOUS | Status: DC

## 2012-08-19 MED ORDER — WARFARIN SODIUM 2 MG PO TABS
2.0000 mg | ORAL_TABLET | Freq: Every day | ORAL | Status: DC
  Administered 2012-08-19: 2 mg via ORAL
  Filled 2012-08-19: qty 1

## 2012-08-19 MED ORDER — ENSURE COMPLETE PO LIQD: 237.0000 mL | Freq: Two times a day (BID) | ORAL | Status: AC

## 2012-08-19 MED ORDER — ESCITALOPRAM OXALATE 5 MG PO TABS: 5.0000 mg | ORAL_TABLET | Freq: Every day | ORAL | Status: AC

## 2012-08-19 MED ORDER — WARFARIN SODIUM 2 MG PO TABS: 2.0000 mg | ORAL_TABLET | Freq: Every day | ORAL | Status: DC

## 2012-08-19 NOTE — Progress Notes (Signed)
NCM faxed orders for [facility] to [facility] for possible d/c today with HH. Spoke to pt and states she lives with her son, [name]. Explained Unit RN, Marcelino Duster will speak to son when arrives to see if any additional NCM needs identified. Pt states she does not need any DME at this time. Isidoro Donning RN CCM Case Mgmt phone [phone]

## 2012-08-19 NOTE — Progress Notes (Signed)
ANTICOAGULATION CONSULT NOTE - Follow Up Consult  Pharmacy Consult for Lovenox/Coumadin Indication: Bilateral DVTs  Allergies  Allergen Reactions  . Lactose Intolerance (Gi) Diarrhea and Nausea Only    Patient Measurements: Height: 5\' 1"  (154.9 cm) Weight: 126 lb 3.2 oz (57.244 kg) IBW/kg (Calculated) : 47.8  Heparin Dosing Weight: 57.24 kg  Vital Signs: Temp: 100.4 F (38 C) (01/04 0555) Temp src: Oral (01/04 0555) BP: 131/84 mmHg (01/04 0555) Pulse Rate: 80  (01/04 0555)  Labs:  Basename 08/19/12 0600 08/18/12 0550 08/17/12 1000 08/17/12 0558 08/17/12 0030  HGB -- 11.5* -- 10.8* --  HCT -- 34.1* -- 32.3* --  PLT -- 184 -- 138* --  APTT -- -- -- -- --  LABPROT 21.7* 20.8* -- 16.2* --  INR 1.98* 1.87* -- 1.33 --  HEPARINUNFRC -- -- 0.54 -- 0.54  CREATININE -- 1.55* -- 1.58* --  CKTOTAL -- -- -- -- --  CKMB -- -- -- -- --  TROPONINI -- -- -- -- --    Estimated Creatinine Clearance: 22.2 ml/min (by C-G formula based on Cr of 1.55).     Assessment:  79 yo F with B DVTs.   Anticoagulation: B DVT (bed-bound). VQ unlikely PE. INR up to 1.98  (up from 1.15 baseline).  Yesterday . Hgb 11.5. Platelets up to 184. Day # 4/5 mininum overlap for VTE treatment. No bleeding reported.  Levaquin started which may increase INR.  Goal of Therapy:  LMWH level 0.6-1.2 (4hrs post-dose) INR 2-3 Monitor platelets by anticoagulation protocol: Yes   Plan:  Lovenox 60mg  once daily  X 2 more doses or until INR > 2. Coumadin 2 mg po daily. INR to be checked Monday 1/6 with Dr. Ramond Craver. Herby Abraham, Pharm.D. [phone] 08/19/2012 1:01 PM

## 2012-08-19 NOTE — Discharge Summary (Signed)
Physician Discharge Summary  Patient ID: [name] MRN: [id number] DOB/AGE: [birth year] 77 y.o.  Admit date: 08/14/2012 Discharge date: 08/19/2012  Primary Care Physician:  [name], MD  Discharge Diagnoses:    . CKD (chronic kidney disease), stage III . Hypertension . Hyperlipidemia . Atrial fibrillation . Dementia . GERD (gastroesophageal reflux disease) . Depression . Temporal arteritis   Urinary tract infection due to Klebsiella pneumonia/Pyuria    Healthcare associated pneumonia  Bilateral lower extremity DVT Moderate malnutrition Hyponatremia  Consults: none  Discharge Medications:   Medication List     As of 08/19/2012  9:46 AM    TAKE these medications         acetaminophen 325 MG tablet   Commonly known as: TYLENOL   Take 325 mg by mouth daily as needed.      bisoprolol 5 MG tablet   Commonly known as: ZEBETA   Take 1 tablet (5 mg total) by mouth daily.      brimonidine-timolol 0.2-0.5 % ophthalmic solution   Commonly known as: COMBIGAN   Place 1 drop into both eyes every 12 (twelve) hours.      brinzolamide 1 % ophthalmic suspension   Commonly known as: AZOPT   Place 1 drop into both eyes daily.      enoxaparin 60 MG/0.6ML injection   Commonly known as: LOVENOX   Inject 0.6 mLs (60 mg total) into the skin daily.      escitalopram 5 MG tablet   Commonly known as: LEXAPRO   Take 1 tablet (5 mg total) by mouth daily.      ezetimibe 10 MG tablet   Commonly known as: ZETIA   Take 1 tablet (10 mg total) by mouth daily.      feeding supplement Liqd   Take 237 mLs by mouth 2 (two) times daily between meals. Also available OTC      levETIRAcetam 250 MG tablet   Commonly known as: KEPPRA   Take 250 mg by mouth 2 (two) times daily.      levofloxacin 500 MG tablet   Commonly known as: LEVAQUIN   Take 1 tablet (500 mg total) by mouth every other day. x2 doses      pantoprazole 40 MG tablet   Commonly known as: PROTONIX   Take 40 mg by  mouth daily.      predniSONE 10 MG tablet   Commonly known as: DELTASONE   Take 5 mg by mouth daily.      warfarin 2 MG tablet   Commonly known as: COUMADIN   Take 1 tablet (2 mg total) by mouth daily. Adjust dose according to PT/INR         Brief H and P: For complete details please refer to admission H and P, but in brief79 year old female with PMH most significant for blindness, HTN, CAD, Stroke, CKD stage III, hyperlipidemia and arthritis comes in today with chief complaints of abdominal pain since last 10 days. Pain started in the entire abdomen but localized to lower abdomen, medium in intensity, non radiating, no exacerbation or relieving factors. Patient also started having cough with shortness of breath about 5 days prior to admission. Patient is usually bed bound and not very functional due to her disability. Patient also endorses sweating since last 5 days. No sputum noted but patient vomited once soon after drinking prune juice. No chest pain, no headaches, no palpitations, no swelling in extremities, no orthopnea or PND.   Hospital Course:  Healthcare associated pneumonia: Patient was admitted with right midlung pneumonia She was initially placed on IV Rocephin and Zithromax, subsequently changed to Vanc and Zosyn.   Influenza PCR was negative. Blood cultures remained negative. Patient needs 2 more doses of oral levaquin  (antibiotic started on 08/14/2012)  Urinary tract infection due to Klebsiella pneumonia/Pyuria  -Urine culture growing Klebsiella pneumonia sensitive to levofloxacin, No signs of pyelonephritis on CT abdomen.   Hyponatremia  Resolved with IV fluids.   Abdominal pain: Improved, chronic problem  -CT abdomen pelvis--no acute abnormalities except for left common iliac and left common femoral vein DVT noted   Moderate Malnutrition  -Tolerating diet, continue Ensure  Bilateral lower extremity DVT; V/Q scan low probability. Patient was placed on  subcutaneous Lovenox and Coumadin, INR is 1.98 at the time of discharge. As patient wants to be discharged home today, gave prescriptions for 2 more days of lovenox and coumadin. Patient's son reported Lovenox injections, she will follow up with primary care physician on Monday for PT/INR check   Atrial fibrillation  -Patient remained in sinus at this time, on coumadin   Acute on chronic renal failure/CKD stage III-IV: Serum creatinine back to baseline with intravenous fluids, saline lock IV fluids. Baseline creatinine 1.4-1.6   History of temporal arteritis  -Continue long-term low-dose prednisone  History of NSTEMI  -Continue Zebeta, zetia  Generalized weakness  Physical therapy recommending home health physical therapy.      Day of Discharge BP 131/84  Pulse 80  Temp 100.4 F (38 C) (Oral)  Resp 18  Ht 5\' 1"  (1.549 m)  Wt 57.244 kg (126 lb 3.2 oz)  BMI 23.85 kg/m2  SpO2 97%  Physical Exam: General: Alert and awake oriented x3 not in any acute distress. HEENT: anicteric sclera, pupils reactive to light and accommodation CVS: S1-S2 clear no murmur rubs or gallops Chest: clear to auscultation bilaterally, no wheezing rales or rhonchi Abdomen: soft nontender, nondistended, normal bowel sounds, no organomegaly Extremities: no cyanosis, clubbing or edema noted bilaterally    The results of significant diagnostics from this hospitalization (including imaging, microbiology, ancillary and laboratory) are listed below for reference.    LAB RESULTS: Basic Metabolic Panel:  Lab 08/18/12 1610 08/17/12 0558  NA 136 137  K 4.1 3.7  CL 103 105  CO2 19 18*  GLUCOSE 91 98  BUN 16 18  CREATININE 1.55* 1.58*  CALCIUM 9.6 8.7  MG -- --  PHOS -- --   Liver Function Tests:  Lab 08/14/12 1744  AST 19  ALT 12  ALKPHOS 47  BILITOT 0.3  PROT 8.2  ALBUMIN 2.9*   CBC:  Lab 08/18/12 0550 08/17/12 0558 08/16/12 0230  WBC 10.4 10.4 --  NEUTROABS -- -- 9.1*  HGB 11.5* 10.8* --   HCT 34.1* 32.3* --  MCV 94.2 -- --  PLT 184 138* --   Significant Diagnostic Studies:  Nm Pulmonary Perf And Vent  08/15/2012  *RADIOLOGY REPORT*  Clinical data:  Acute DVT.  Chest pain, shortness of breath. Companion chest radiograph from the previous day shows an ill- defined peripheral right midlung opacity, otherwise clear.  VENTILATION PERFUSION SCINTIGRAPHY   Comparison:  06/08/2012  indings: Ventilation imaging with 40 mCi technetium 25m DTPA inhaled shows large quantity of central deposition, poor peripheral activity.  No washout images.   Perfusion imaging with 3.71mCi Tc56m MAA IV shows improvement in the previously noted moderate subsegmental lingular, lateral basal   left lower lobe, posterior left upper lobe, and posterior  right upper lobe perfusion defects. No new segmental or subsegmental perfusion defects to suggest interval PE.  IMPRESSION 1.  Low likelihood ratio for interval acute pulmonary embolism.   Original Report Authenticated By: D. Andria Rhein, MD      Disposition and Follow-up:     Discharge Orders    Future Appointments: Provider: Department: Dept Phone: Center:   08/23/2012 2:00 PM Beatrice Lecher, PA [facility] Main Office [place]) [phone] [id number]     Future Orders Please Complete By Expires   Diet - low sodium heart healthy      Increase activity slowly      Discharge instructions      Comments:   Please follow-up with your PCP, Dr Concepcion Elk on Monday for PT/INR. Continue Lovenox injection tomorrow. Follow your primary doctor's instructions for further dosing with labs on Monday, 08/22/11       DISPOSITION: home  DIET: heart healthy diet ACTIVITY:as tolerated   DISCHARGE FOLLOW-UP Follow-up Information    Follow up with Fleet Contras A, MD. Schedule an appointment as soon as possible for a visit on 08/21/2012. (for PT/INR check and follow-up)    Contact information:   [address] [place] [address] [phone]           Time spent on Discharge: 35 mins  Signed:   Italia Wolfert M.D. [facility] 08/19/2012, 9:46 AM Pager: [phone]

## 2012-08-19 NOTE — Progress Notes (Signed)
Pt discharged per MD order and protocol. Pt's son stated they have all the equipment needed at home already. [facility] notified of pt's discharge per order. Lovenox, Levaquin, and Coumadin given per MD order. Lovenox injection teaching reviewed with pt's son and return demonstration completed. Discharge instructions reviewed and all questions answered. Pt aware of all follow up appointments.

## 2012-08-20 ENCOUNTER — Emergency Department (HOSPITAL_COMMUNITY)
Admission: EM | Admit: 2012-08-20 | Discharge: 2012-08-20 | Disposition: A | Discharge status: Home Health | Payer: Medicare Other | Attending: Emergency Medicine | Admitting: Emergency Medicine

## 2012-08-20 ENCOUNTER — Encounter (HOSPITAL_COMMUNITY): Payer: Self-pay | Admitting: Adult Health

## 2012-08-20 DIAGNOSIS — Z8739 Personal history of other diseases of the musculoskeletal system and connective tissue: Secondary | ICD-10-CM | POA: Insufficient documentation

## 2012-08-20 DIAGNOSIS — Z8679 Personal history of other diseases of the circulatory system: Secondary | ICD-10-CM | POA: Insufficient documentation

## 2012-08-20 DIAGNOSIS — N183 Chronic kidney disease, stage 3 unspecified: Secondary | ICD-10-CM | POA: Insufficient documentation

## 2012-08-20 DIAGNOSIS — R5381 Other malaise: Secondary | ICD-10-CM | POA: Insufficient documentation

## 2012-08-20 DIAGNOSIS — R531 Weakness: Secondary | ICD-10-CM

## 2012-08-20 DIAGNOSIS — Z9861 Coronary angioplasty status: Secondary | ICD-10-CM | POA: Insufficient documentation

## 2012-08-20 DIAGNOSIS — Z8719 Personal history of other diseases of the digestive system: Secondary | ICD-10-CM | POA: Insufficient documentation

## 2012-08-20 DIAGNOSIS — Z862 Personal history of diseases of the blood and blood-forming organs and certain disorders involving the immune mechanism: Secondary | ICD-10-CM | POA: Insufficient documentation

## 2012-08-20 DIAGNOSIS — Z87891 Personal history of nicotine dependence: Secondary | ICD-10-CM | POA: Insufficient documentation

## 2012-08-20 DIAGNOSIS — Z8674 Personal history of sudden cardiac arrest: Secondary | ICD-10-CM | POA: Insufficient documentation

## 2012-08-20 DIAGNOSIS — E785 Hyperlipidemia, unspecified: Secondary | ICD-10-CM | POA: Insufficient documentation

## 2012-08-20 DIAGNOSIS — Z8639 Personal history of other endocrine, nutritional and metabolic disease: Secondary | ICD-10-CM | POA: Insufficient documentation

## 2012-08-20 DIAGNOSIS — R109 Unspecified abdominal pain: Secondary | ICD-10-CM | POA: Insufficient documentation

## 2012-08-20 DIAGNOSIS — R110 Nausea: Secondary | ICD-10-CM | POA: Insufficient documentation

## 2012-08-20 DIAGNOSIS — Z8669 Personal history of other diseases of the nervous system and sense organs: Secondary | ICD-10-CM | POA: Insufficient documentation

## 2012-08-20 DIAGNOSIS — I2510 Atherosclerotic heart disease of native coronary artery without angina pectoris: Secondary | ICD-10-CM | POA: Insufficient documentation

## 2012-08-20 DIAGNOSIS — Z79899 Other long term (current) drug therapy: Secondary | ICD-10-CM | POA: Insufficient documentation

## 2012-08-20 DIAGNOSIS — F0390 Unspecified dementia without behavioral disturbance: Secondary | ICD-10-CM | POA: Insufficient documentation

## 2012-08-20 DIAGNOSIS — Z90710 Acquired absence of both cervix and uterus: Secondary | ICD-10-CM | POA: Insufficient documentation

## 2012-08-20 DIAGNOSIS — I129 Hypertensive chronic kidney disease with stage 1 through stage 4 chronic kidney disease, or unspecified chronic kidney disease: Secondary | ICD-10-CM | POA: Insufficient documentation

## 2012-08-20 DIAGNOSIS — K921 Melena: Secondary | ICD-10-CM | POA: Insufficient documentation

## 2012-08-20 DIAGNOSIS — R195 Other fecal abnormalities: Secondary | ICD-10-CM

## 2012-08-20 DIAGNOSIS — G40909 Epilepsy, unspecified, not intractable, without status epilepticus: Secondary | ICD-10-CM | POA: Insufficient documentation

## 2012-08-20 DIAGNOSIS — Z7901 Long term (current) use of anticoagulants: Secondary | ICD-10-CM | POA: Insufficient documentation

## 2012-08-20 DIAGNOSIS — Z87448 Personal history of other diseases of urinary system: Secondary | ICD-10-CM | POA: Insufficient documentation

## 2012-08-20 DIAGNOSIS — R197 Diarrhea, unspecified: Secondary | ICD-10-CM | POA: Insufficient documentation

## 2012-08-20 DIAGNOSIS — Z8673 Personal history of transient ischemic attack (TIA), and cerebral infarction without residual deficits: Secondary | ICD-10-CM | POA: Insufficient documentation

## 2012-08-20 LAB — CBC
MCH: 31.7 pg (ref 26.0–34.0)
MCHC: 33.8 g/dL (ref 30.0–36.0)
MCV: 93.9 fL (ref 78.0–100.0)
Platelets: 276 10*3/uL (ref 150–400)
RDW: 17.1 % — ABNORMAL HIGH (ref 11.5–15.5)

## 2012-08-20 LAB — TYPE AND SCREEN: Antibody Screen: NEGATIVE

## 2012-08-20 LAB — COMPREHENSIVE METABOLIC PANEL
ALT: 11 U/L (ref 0–35)
AST: 17 U/L (ref 0–37)
Albumin: 2.1 g/dL — ABNORMAL LOW (ref 3.5–5.2)
Calcium: 9.4 mg/dL (ref 8.4–10.5)
Creatinine, Ser: 1.33 mg/dL — ABNORMAL HIGH (ref 0.50–1.10)
GFR calc non Af Amer: 37 mL/min — ABNORMAL LOW (ref >90)
Sodium: 133 mEq/L — ABNORMAL LOW (ref 135–145)
Total Protein: 7.7 g/dL (ref 6.0–8.3)

## 2012-08-20 LAB — PROTIME-INR: Prothrombin Time: 24.2 seconds — ABNORMAL HIGH (ref 11.6–15.2)

## 2012-08-20 LAB — OCCULT BLOOD, POC DEVICE: Fecal Occult Bld: NEGATIVE

## 2012-08-20 NOTE — ED Provider Notes (Signed)
I saw and evaluated the patient, reviewed the resident's note and I agree with the findings and plan. Agree with EKG interpretation if present.   Pt with recent admission for PNA, UTI also found to have DVT and started on coumadin. Reports reddish stool yesterday and today, no black tarry stools. Has been on Lovenox bridge and coumadin PO. Hgb normal here and heme negative stool.   Charles B. Bernette Mayers, MD 08/20/12 Rickey Primus

## 2012-08-20 NOTE — ED Provider Notes (Signed)
History     CSN: [id number]  Arrival date & time 08/20/12  1554   First MD Initiated Contact with Patient 08/20/12 1614      Chief Complaint  Patient presents with  . Melena    (Consider location/radiation/quality/duration/timing/severity/associated sxs/prior treatment) Patient is a 79 y.o. female presenting with general illness. The history is provided by the patient. No language interpreter was used.  Illness  The current episode started yesterday. The problem occurs frequently. The problem has been unchanged. The problem is moderate. Nothing relieves the symptoms. Nothing aggravates the symptoms. Associated symptoms include abdominal pain and nausea. Pertinent negatives include no fever, no constipation, no diarrhea, no vomiting, no congestion, no headaches, no sore throat, no cough and no rash.    Past Medical History  Diagnosis Date  . Arthritis   . Hypertension   . Coronary artery disease     a. Neg stress test 2008. b. ?questionable history - pt thought she had heart cath in 1970's but details were unclear.  . Stroke     a. Details unclear.  . Seizures     a. Due to hyponatremia, resolved.  Marland Kitchen History of gout   . Glaucoma   . Cataracts, bilateral   . Acute kidney injury   . CKD (chronic kidney disease), stage III   . Hyperlipidemia   . Gallstones   . Cerebrovascular disease   . Hyponatremia     a. H/o 2011 ?SIADH with seizures, encephalopathy at that time.  . Temporal arteritis   . Cardiac arrest 05/2012    x 2, thought due to renal failure and hypoK  . Dementia 06/19/2012    Past Surgical History  Procedure Date  . Eye surgery   . Abdominal hysterectomy   . Coronary angioplasty with stent placement     Family History  Problem Relation Age of Onset  . Hypertension    . Diabetes type II    . Hypertension Mother   . Hypertension Father     History  Substance Use Topics  . Smoking status: Former Games developer  . Smokeless tobacco: Never Used  . Alcohol Use:  No     Comment: used to drink beer heavily per old records.     OB History    Grav Para Term Preterm Abortions TAB SAB Ect Mult Living                  Review of Systems  Constitutional: Negative for fever and chills.  HENT: Negative for congestion and sore throat.   Respiratory: Negative for cough and shortness of breath.   Cardiovascular: Negative for chest pain and leg swelling.  Gastrointestinal: Positive for nausea, abdominal pain and blood in stool. Negative for vomiting, diarrhea and constipation.  Genitourinary: Negative for dysuria and frequency.  Skin: Negative for color change and rash.  Neurological: Positive for weakness. Negative for dizziness and headaches.  Psychiatric/Behavioral: Negative for confusion and agitation.  All other systems reviewed and are negative.    Allergies  Lactose intolerance (gi)  Home Medications   Current Outpatient Rx  Name  Route  Sig  Dispense  Refill  . ACETAMINOPHEN 325 MG PO TABS   Oral   Take 325 mg by mouth daily as needed. For pain         . BISOPROLOL FUMARATE 5 MG PO TABS   Oral   Take 1 tablet (5 mg total) by mouth daily.         Marland Kitchen BRIMONIDINE TARTRATE-TIMOLOL  0.2-0.5 % OP SOLN   Both Eyes   Place 1 drop into both eyes every 12 (twelve) hours.         Marland Kitchen BRINZOLAMIDE 1 % OP SUSP   Both Eyes   Place 1 drop into both eyes daily.         Marland Kitchen ENOXAPARIN SODIUM 60 MG/0.6ML [place] SOLN   Subcutaneous   Inject 0.6 mLs (60 mg total) into the skin daily.   2 Syringe   0   . ESCITALOPRAM OXALATE 5 MG PO TABS   Oral   Take 1 tablet (5 mg total) by mouth daily.   30 tablet   0   . EZETIMIBE 10 MG PO TABS   Oral   Take 1 tablet (10 mg total) by mouth daily.   30 tablet   0   . ENSURE COMPLETE PO LIQD   Oral   Take 237 mLs by mouth 2 (two) times daily between meals. Also available OTC   30 Bottle   3   . LEVETIRACETAM 250 MG PO TABS   Oral   Take 250 mg by mouth 2 (two) times daily.          Marland Kitchen  LEVOFLOXACIN 500 MG PO TABS   Oral   Take 1 tablet (500 mg total) by mouth every other day. x2 doses   2 tablet   0   . PANTOPRAZOLE SODIUM 40 MG PO TBEC   Oral   Take 40 mg by mouth daily.         Marland Kitchen PREDNISONE 10 MG PO TABS   Oral   Take 5 mg by mouth daily.         . WARFARIN SODIUM 2 MG PO TABS   Oral   Take 1 tablet (2 mg total) by mouth daily. Adjust dose according to PT/INR   15 tablet   0     BP 124/87  Pulse 74  Temp 97.5 F (36.4 C) (Oral)  Resp 16  SpO2 97%  Physical Exam  Vitals reviewed. Constitutional: She is oriented to person, place, and time. She appears well-developed and well-nourished. No distress.  HENT:  Head: Normocephalic and atraumatic.  Eyes: EOM are normal. Pupils are equal, round, and reactive to light.  Neck: Normal range of motion. Neck supple.  Cardiovascular: Normal rate and regular rhythm.   Pulmonary/Chest: Effort normal. No respiratory distress.  Abdominal: Soft. She exhibits no distension. There is no tenderness. There is no rigidity, no rebound, no guarding, no tenderness at McBurney's point and negative Murphy's sign.  Genitourinary: Rectum normal. Guaiac negative stool.  Musculoskeletal: Normal range of motion. She exhibits no edema.  Neurological: She is alert and oriented to person, place, and time.  Skin: Skin is warm and dry.  Psychiatric: She has a normal mood and affect. Her behavior is normal.    ED Course  Procedures (including critical care time)   Labs Reviewed  PROTIME-INR  CBC  COMPREHENSIVE METABOLIC PANEL  TYPE AND SCREEN   No results found.  Results for orders placed during the hospital encounter of 08/20/12  PROTIME-INR      Component Value Range   Prothrombin Time 24.2 (*) 11.6 - 15.2 seconds   INR 2.29 (*) 0.00 - 1.49  CBC      Component Value Range   WBC 11.9 (*) 4.0 - 10.5 K/uL   RBC 3.78 (*) 3.87 - 5.11 MIL/uL   Hemoglobin 12.0  12.0 - 15.0 g/dL   HCT  35.5 (*) 36.0 - 46.0 %   MCV 93.9   78.0 - 100.0 fL   MCH 31.7  26.0 - 34.0 pg   MCHC 33.8  30.0 - 36.0 g/dL   RDW 16.1 (*) 09.6 - 04.5 %   Platelets 276  150 - 400 K/uL  COMPREHENSIVE METABOLIC PANEL      Component Value Range   Sodium 133 (*) 135 - 145 mEq/L   Potassium 4.0  3.5 - 5.1 mEq/L   Chloride 98  96 - 112 mEq/L   CO2 17 (*) 19 - 32 mEq/L   Glucose, Bld 128 (*) 70 - 99 mg/dL   BUN 15  6 - 23 mg/dL   Creatinine, Ser 4.09 (*) 0.50 - 1.10 mg/dL   Calcium 9.4  8.4 - 81.1 mg/dL   Total Protein 7.7  6.0 - 8.3 g/dL   Albumin 2.1 (*) 3.5 - 5.2 g/dL   AST 17  0 - 37 U/L   ALT 11  0 - 35 U/L   Alkaline Phosphatase 51  39 - 117 U/L   Total Bilirubin 0.2 (*) 0.3 - 1.2 mg/dL   GFR calc non Af Amer 37 (*) >90 mL/min   GFR calc Af Amer 43 (*) >90 mL/min  TYPE AND SCREEN      Component Value Range   ABO/RH(D) A NEG     Antibody Screen NEG     Sample Expiration 08/23/2012    OCCULT BLOOD, POC DEVICE      Component Value Range   Fecal Occult Bld NEGATIVE  NEGATIVE    No diagnosis found.    MDM  Pt w/ recent dx of BLE DVT started on coumadin and lovenox. Developed melenotic stool last pm. Multiple episodes of loose, dark and orange stool. No hematochezia. Mild crampy abd pain yesterday. No abd pain now. No emesis. States she feels generally weak.  Exam: afebrile, normotensive, GCS 15, no resp distress or hypoxia. HR 74. abd soft and benign - nttp, no rebound/guarding or clinical peritonitis. Pale mucus membranes. Rectal exam w/ orange stool - no gross blood.   Plan: will check CBC, coags, cmp, type and screen and hemoccult stool. Pt is currently stable, no indication for NGT or IVF at this time.   Course: reassessed, vitals stable, hemoccult neg, hgb 12, INR 2.29, labs otherwise unremarkable. At this time does not appear to be acute GI bleed. Instructed to d/c lovenox as she is now therapeutic on coumadin. Recommend follow up w/ pcp in 1-2 days. Stable for d/c home. Given return precautions.   1. Generalized  weakness   2. Loose stools    New Prescriptions   No medications on file   [name], MD [address] [place] [address] [phone]  On 08/21/2012          Audelia Hives, MD 08/21/12 [phone]

## 2012-08-20 NOTE — ED Notes (Addendum)
Pt was D/C from hospital yesterday after 2-3 day stay for "something with my heart, stomach and blood clots" Began having bloody, dark stools yesterday. Reports 3 bloody stools today, and 2 yesterday. LAst bloody stool one hour ago.   Son is caretaker. She was placed on Lovenox at home, pt also takes Coumadin. Has not had shot today.  Denies abdominal pain, denies pain. Brisk cap refill, pale mucous membranes.

## 2012-08-20 NOTE — ED Notes (Signed)
Pt discharged to home with family. NAD.  

## 2012-08-21 LAB — CULTURE, BLOOD (ROUTINE X 2): Culture: NO GROWTH

## 2012-08-23 ENCOUNTER — Ambulatory Visit (INDEPENDENT_AMBULATORY_CARE_PROVIDER_SITE_OTHER): Payer: Medicare Other | Admitting: Physician Assistant

## 2012-08-23 ENCOUNTER — Encounter: Payer: Self-pay | Admitting: Physician Assistant

## 2012-08-23 ENCOUNTER — Telehealth: Payer: Self-pay | Admitting: *Deleted

## 2012-08-23 VITALS — BP 130/85 | HR 80 | Ht 60.0 in | Wt 126.0 lb

## 2012-08-23 DIAGNOSIS — N189 Chronic kidney disease, unspecified: Secondary | ICD-10-CM

## 2012-08-23 DIAGNOSIS — R197 Diarrhea, unspecified: Secondary | ICD-10-CM

## 2012-08-23 DIAGNOSIS — I4891 Unspecified atrial fibrillation: Secondary | ICD-10-CM

## 2012-08-23 DIAGNOSIS — I2510 Atherosclerotic heart disease of native coronary artery without angina pectoris: Secondary | ICD-10-CM

## 2012-08-23 DIAGNOSIS — R0602 Shortness of breath: Secondary | ICD-10-CM

## 2012-08-23 DIAGNOSIS — I82409 Acute embolism and thrombosis of unspecified deep veins of unspecified lower extremity: Secondary | ICD-10-CM

## 2012-08-23 LAB — BRAIN NATRIURETIC PEPTIDE: Pro B Natriuretic peptide (BNP): 77 pg/mL (ref 0.0–100.0)

## 2012-08-23 NOTE — Telephone Encounter (Signed)
both pt and husband notified about lab results w/verbal understanding

## 2012-08-23 NOTE — Telephone Encounter (Signed)
Message copied by Tarri Fuller on Wed Aug 23, 2012  5:59 PM ------      Message from: [place], [place] T      Created: Wed Aug 23, 2012  5:49 PM       BNP normal      Continue with current treatment plan.      Tereso Newcomer, PA-C  5:49 PM 08/23/2012

## 2012-08-23 NOTE — Progress Notes (Signed)
[address] [place], [place]  [address] Phone: [phone], Fax:  [phone]  Date:  08/23/2012   Name:  [name]   DOB:  January 14, 1931   MRN:  [id number]  PCP:  [name], MD  Primary Cardiologist:  Dr. Hillis Range  Primary Electrophysiologist:  Dr. Hillis Range    History of Present Illness: [name] is a 79 y.o. female who returns for f/u.  She has a hx of HTN, HL, temporal arteritis on chronic prednisone, cerebrovascular disease. She was initially evaluated by Dr. Johney Frame in 10/13 during an admission for PEA arrest (ultimately thought to be due to renal failure and metabolic derangement) requiring multiple resuscitations complicated by GI bleeding, renal insufficiency and possibly ischemic bowel.   Echo 10/13: EF 60-65%, grade 1 diastolic dysfunction, dilated RV (RV function appears depressed), mild to moderate TR, PASP 35.    There was some concern for PE. Venous Dopplers were negative. VQ scan was intermediate probability. She was felt to be low risk for life threatening embolism by CCM and she was not anticoagulated. She apparently also had some episodes of atrial fibrillation. However, she was not felt to be a candidate for anticoagulation given her GI bleeding. She was seen by palliative care and made a DO NOT RESUSCITATE. Of note, she did have positive enzymes for a non-STEMI. Myoview was inconclusive. Medical therapy was recommended. Patient seen by gastroenterology. Colonoscopy was deferred by the patient.  Patient was followed up in the office by Dr. Johney Frame 07/23/12. He continued to feel that she was a poor candidate for anticoagulation therapy with recent GI bleeding. Conservative management for possible underlying CAD was also recommended. Aldactone was held due to hyperkalemia.  She was asked to followup with me today.   Since seeing Dr. Johney Frame she was admitted 12/3-1/4. She presented with abdominal pain. She also noted cough and  shortness of breath. She was treated for pneumonia. Blood cultures were negative. She did have Klebsiella in her urine and was treated with levofloxacin. Abdominal CT was negative except for left common iliac and left common femoral DVT. This was confirmed by U/S.  VQ scan was low probability and she was placed on Coumadin.  Coumadin managed by PCP.  Went back to ED with ? Blood in stools 1/5.  ED notes reviewed and she was heme neg with stable Hgb.   She is living at home with her son now. She denies chest pain. She notes class III dyspnea. She sleeps on 2 pillows. She does this for comfort and not orthopnea. She denies any significant symptoms consistent with PND. She does have some lower extremity edema. She attributes this to her DVT. She denies syncope. Her biggest complaint is that of weakness and continued diarrhea.  Labs (1/14):  K 4, creatinine 1.33, ALT 11, Hgb 12  Wt Readings from Last 3 Encounters:  08/23/12 126 lb (57.153 kg)  08/14/12 126 lb 3.2 oz (57.244 kg)  07/12/12 122 lb (55.339 kg)     Past Medical History  Diagnosis Date  . Arthritis   . Hypertension   . Coronary artery disease     a. Neg stress test 2008. b. ?questionable history - pt thought she had heart cath in 1970's but details were unclear.  . Stroke     a. Details unclear.  . Seizures     a. Due to hyponatremia, resolved.  Marland Kitchen History of gout   . Glaucoma   . Cataracts, bilateral   .  Acute kidney injury   . CKD (chronic kidney disease), stage III   . Hyperlipidemia   . Gallstones   . Cerebrovascular disease   . Hyponatremia     a. H/o 2011 ?SIADH with seizures, encephalopathy at that time.  . Temporal arteritis   . Cardiac arrest 05/2012    a.  x 2, thought due to renal failure and hypoK;  b.  Echo 10/13: EF 60-65%, grade 1 diastolic dysfunction, dilated RV (RV function appears depressed), mild to moderate TR, PASP 35;   c.  Myoview performed but inconclusive  . Dementia 06/19/2012    Current  Outpatient Prescriptions  Medication Sig Dispense Refill  . acetaminophen (TYLENOL) 325 MG tablet Take 325 mg by mouth daily as needed. For pain      . bisoprolol (ZEBETA) 5 MG tablet Take 1 tablet (5 mg total) by mouth daily.      . brimonidine-timolol (COMBIGAN) 0.2-0.5 % ophthalmic solution Place 1 drop into both eyes every 12 (twelve) hours.      . brinzolamide (AZOPT) 1 % ophthalmic suspension Place 1 drop into both eyes daily.      Marland Kitchen escitalopram (LEXAPRO) 5 MG tablet Take 1 tablet (5 mg total) by mouth daily.  30 tablet  0  . ezetimibe (ZETIA) 10 MG tablet Take 1 tablet (10 mg total) by mouth daily.  30 tablet  0  . feeding supplement (ENSURE COMPLETE) LIQD Take 237 mLs by mouth 2 (two) times daily between meals. Also available OTC  30 Bottle  3  . pantoprazole (PROTONIX) 40 MG tablet Take 40 mg by mouth daily.      . predniSONE (DELTASONE) 10 MG tablet Take 5 mg by mouth daily.      Marland Kitchen warfarin (COUMADIN) 2 MG tablet Take 1 tablet (2 mg total) by mouth daily. Adjust dose according to PT/INR  15 tablet  0  . levETIRAcetam (KEPPRA) 250 MG tablet Take 250 mg by mouth 2 (two) times daily.         Allergies: Allergies  Allergen Reactions  . Lactose Intolerance (Gi) Diarrhea and Nausea Only    Social History:  The patient  reports that she has quit smoking. She has never used smokeless tobacco. She reports that she does not drink alcohol or use illicit drugs.   ROS:  Please see the history of present illness.     All other systems reviewed and negative.   PHYSICAL EXAM: VS:  BP 130/85  Pulse 80  Ht 5' (1.524 m)  Wt 126 lb (57.153 kg)  BMI 24.61 kg/m2 Well nourished, well developed, in no acute distress HEENT: normal Neck: no JVD Cardiac:  normal S1, S2; RRR; no murmur Lungs:  clear to auscultation bilaterally, no wheezing, rhonchi or rales Abd: soft, nontender, no hepatomegaly Ext: no edema Skin: warm and dry Neuro:  CNs 2-12 intact, no focal abnormalities noted  EKG:  NSR,  HR 80, normal axis, nonspecific ST-T wave changes     ASSESSMENT AND PLAN:  1. Coronary Artery Disease:  Presumed. Patient had non-STEMI in the setting of PEA arrest. Question if this was demand related versus true underlying coronary artery disease.  Myoview was inconclusive. She's had normal LV function documented. She denies any symptoms consistent with angina. Conservative therapy has been recommended. She is not on aspirin as she is on Coumadin. Cholesterol is managed by her PCP with Zetia. She remains on bisoprolol.  2. Atrial Fibrillation:   Documented in the past during her hospitalization  for PEA arrest. She was previously thought to be a poor candidate for anticoagulation. However, now she is on anticoagulation therapy due to DVT. She remains in sinus rhythm.  3. DVT:   Patient's Coumadin is managed by her primary care physician.  4. Diarrhea:   This may be antibiotic associated. She sees her primary care physician back early next week. I have recommended that she obtain probiotics OTC for now. She will followup next week with her PCP for her diarrhea.  5. Hypertension:  Controlled.  6. Chronic Kidney Disease:  Creatinine stable by recent measurement.  7. Dyspnea:  I suspect that this is related to deconditioning more than anything else. I will obtain a BNP today. If this is significantly elevated, we will place her on low-dose diuretics.  8. Disposition:  Followup with me in 2 months.  Signed, Tereso Newcomer, PA-C  2:22 PM 08/23/2012

## 2012-08-23 NOTE — Patient Instructions (Addendum)
LAB TODAY; BNP  TAKE AN OTC PROBIOTIC DAILY AND FOLLOW UP WITH YOUR PRIMARY CARE PHYSICIAN FOR THE DIARRHEA  YOU HAVE A FOLLOW UP WITH SCOTT WEAVER, [facility] ON 10/23/12 @ 2 PM

## 2012-08-31 ENCOUNTER — Other Ambulatory Visit: Payer: Self-pay | Admitting: Family Medicine

## 2012-09-01 ENCOUNTER — Other Ambulatory Visit: Payer: Self-pay | Admitting: Family Medicine

## 2012-09-21 ENCOUNTER — Encounter: Payer: Self-pay | Admitting: Internal Medicine

## 2012-09-26 ENCOUNTER — Telehealth: Payer: Self-pay | Admitting: Internal Medicine

## 2012-09-26 ENCOUNTER — Ambulatory Visit: Payer: Medicare Other | Admitting: Internal Medicine

## 2012-09-30 ENCOUNTER — Other Ambulatory Visit: Payer: Self-pay

## 2012-10-02 ENCOUNTER — Emergency Department (HOSPITAL_COMMUNITY)
Admission: EM | Admit: 2012-10-02 | Discharge: 2012-10-02 | Disposition: A | Discharge status: Home / Self Care | Payer: Medicare Other | Attending: Emergency Medicine | Admitting: Emergency Medicine

## 2012-10-02 ENCOUNTER — Encounter (HOSPITAL_COMMUNITY): Payer: Self-pay | Admitting: *Deleted

## 2012-10-02 ENCOUNTER — Telehealth: Payer: Self-pay | Admitting: Gastroenterology

## 2012-10-02 DIAGNOSIS — N183 Chronic kidney disease, stage 3 unspecified: Secondary | ICD-10-CM | POA: Insufficient documentation

## 2012-10-02 DIAGNOSIS — Z8673 Personal history of transient ischemic attack (TIA), and cerebral infarction without residual deficits: Secondary | ICD-10-CM | POA: Insufficient documentation

## 2012-10-02 DIAGNOSIS — Z8669 Personal history of other diseases of the nervous system and sense organs: Secondary | ICD-10-CM | POA: Insufficient documentation

## 2012-10-02 DIAGNOSIS — Z862 Personal history of diseases of the blood and blood-forming organs and certain disorders involving the immune mechanism: Secondary | ICD-10-CM | POA: Insufficient documentation

## 2012-10-02 DIAGNOSIS — Z8679 Personal history of other diseases of the circulatory system: Secondary | ICD-10-CM | POA: Insufficient documentation

## 2012-10-02 DIAGNOSIS — Z8639 Personal history of other endocrine, nutritional and metabolic disease: Secondary | ICD-10-CM | POA: Insufficient documentation

## 2012-10-02 DIAGNOSIS — Z8674 Personal history of sudden cardiac arrest: Secondary | ICD-10-CM | POA: Insufficient documentation

## 2012-10-02 DIAGNOSIS — Z86718 Personal history of other venous thrombosis and embolism: Secondary | ICD-10-CM | POA: Insufficient documentation

## 2012-10-02 DIAGNOSIS — Z7901 Long term (current) use of anticoagulants: Secondary | ICD-10-CM | POA: Insufficient documentation

## 2012-10-02 DIAGNOSIS — F068 Other specified mental disorders due to known physiological condition: Secondary | ICD-10-CM | POA: Insufficient documentation

## 2012-10-02 DIAGNOSIS — Z79899 Other long term (current) drug therapy: Secondary | ICD-10-CM | POA: Insufficient documentation

## 2012-10-02 DIAGNOSIS — Z8739 Personal history of other diseases of the musculoskeletal system and connective tissue: Secondary | ICD-10-CM | POA: Insufficient documentation

## 2012-10-02 DIAGNOSIS — I129 Hypertensive chronic kidney disease with stage 1 through stage 4 chronic kidney disease, or unspecified chronic kidney disease: Secondary | ICD-10-CM | POA: Insufficient documentation

## 2012-10-02 DIAGNOSIS — R197 Diarrhea, unspecified: Secondary | ICD-10-CM | POA: Insufficient documentation

## 2012-10-02 DIAGNOSIS — E785 Hyperlipidemia, unspecified: Secondary | ICD-10-CM | POA: Insufficient documentation

## 2012-10-02 DIAGNOSIS — Z87828 Personal history of other (healed) physical injury and trauma: Secondary | ICD-10-CM | POA: Insufficient documentation

## 2012-10-02 DIAGNOSIS — I2510 Atherosclerotic heart disease of native coronary artery without angina pectoris: Secondary | ICD-10-CM | POA: Insufficient documentation

## 2012-10-02 HISTORY — DX: Acute embolism and thrombosis of unspecified deep veins of unspecified lower extremity: I82.409

## 2012-10-02 LAB — CBC WITH DIFFERENTIAL/PLATELET
Basophils Absolute: 0 10*3/uL (ref 0.0–0.1)
Eosinophils Relative: 0 % (ref 0–5)
Lymphocytes Relative: 34 % (ref 12–46)
MCV: 96.5 fL (ref 78.0–100.0)
Neutrophils Relative %: 61 % (ref 43–77)
Platelets: 186 10*3/uL (ref 150–400)
RDW: 15.8 % — ABNORMAL HIGH (ref 11.5–15.5)
WBC: 7.5 10*3/uL (ref 4.0–10.5)

## 2012-10-02 LAB — COMPREHENSIVE METABOLIC PANEL
ALT: 18 U/L (ref 0–35)
AST: 19 U/L (ref 0–37)
CO2: 17 mEq/L — ABNORMAL LOW (ref 19–32)
Calcium: 9.2 mg/dL (ref 8.4–10.5)
GFR calc non Af Amer: 35 mL/min — ABNORMAL LOW (ref >90)
Potassium: 4.2 mEq/L (ref 3.5–5.1)
Sodium: 135 mEq/L (ref 135–145)
Total Protein: 7.6 g/dL (ref 6.0–8.3)

## 2012-10-02 MED ORDER — METRONIDAZOLE 500 MG PO TABS: 500.0000 mg | ORAL_TABLET | Freq: Two times a day (BID) | ORAL | Status: DC

## 2012-10-02 NOTE — ED Notes (Signed)
Pt c/o diarrhea (intermittant - every other day), usually occuring immediately after she eats.  Denies any pain.  Pt complains also of weight loss in the last month.
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
## 2012-10-02 NOTE — ED Provider Notes (Signed)
History    CSN: [id number] Arrival date & time 10/02/12  1535 First MD Initiated Contact with Patient 10/02/12 1730     CC: Diarrhea   HPI Pt has been having diarrhea since the end of December.  Every times she eats she will have to go to the bathroom and have a loose stool.  Pt has an appointment with the GI doctor on feb 27th but the family was concerned about how long this has been ongoing for.  At least three times daily with loose stools.  No vomiting or abdominal pain.  THe stools are loose without blood or mucus.  She does have cramping when she has the loose stools and she will feel weak. Pt was seen in the ED previously.  She has not been able to see anyone since that time.  No recent abx.  No foreign travel.   Past Medical History  Diagnosis Date  . Arthritis   . Hypertension   . History of gout   . Glaucoma   . Cataracts, bilateral   . Hyperlipidemia   . Gallstones   . Cerebrovascular disease   . Hyponatremia     a. H/o 2011 ?SIADH with seizures, encephalopathy at that time.  . Temporal arteritis   . Cardiac arrest 05/2012    a.  x 2, thought due to renal failure and hypoK;  b.  Echo 10/13: EF 60-65%, grade 1 diastolic dysfunction, dilated RV (RV function appears depressed), mild to moderate TR, PASP 35;   c.  Myoview performed but inconclusive  . Dementia 06/19/2012  . Coronary artery disease     a. Neg stress test 2008. b. ?questionable history - pt thought she had heart cath in 1970's but details were unclear.  . Acute kidney injury   . CKD (chronic kidney disease), stage III   . Seizures     a. Due to hyponatremia, resolved.  . Stroke     a. Details unclear.  Marland Kitchen DVT (deep venous thrombosis)     Past Surgical History  Procedure Laterality Date  . Eye surgery    . Abdominal hysterectomy    . Coronary angioplasty with stent placement      Family History  Problem Relation Age of Onset  . Hypertension    . Diabetes type II    . Hypertension Mother   .  Hypertension Father     History  Substance Use Topics  . Smoking status: Never Smoker   . Smokeless tobacco: Never Used  . Alcohol Use: No     Comment: used to drink beer heavily per old records.     OB History   Grav Para Term Preterm Abortions TAB SAB Ect Mult Living                  Review of Systems  All other systems reviewed and are negative.    Allergies  Lactose intolerance (gi)  Home Medications   Current Outpatient Rx  Name  Route  Sig  Dispense  Refill  . acetaminophen (TYLENOL) 325 MG tablet   Oral   Take 325 mg by mouth daily as needed. For pain         . bisoprolol (ZEBETA) 5 MG tablet   Oral   Take 1 tablet (5 mg total) by mouth daily.         . brinzolamide (AZOPT) 1 % ophthalmic suspension   Both Eyes   Place 1 drop into both eyes daily.         Marland Kitchen  escitalopram (LEXAPRO) 5 MG tablet   Oral   Take 1 tablet (5 mg total) by mouth daily.   30 tablet   0   . ezetimibe (ZETIA) 10 MG tablet   Oral   Take 1 tablet (10 mg total) by mouth daily.   30 tablet   0   . feeding supplement (ENSURE COMPLETE) LIQD   Oral   Take 237 mLs by mouth 2 (two) times daily between meals. Also available OTC   30 Bottle   3   . levETIRAcetam (KEPPRA) 250 MG tablet   Oral   Take 250 mg by mouth 2 (two) times daily.          . pantoprazole (PROTONIX) 40 MG tablet   Oral   Take 40 mg by mouth daily.         . predniSONE (DELTASONE) 10 MG tablet   Oral   Take 5 mg by mouth daily.         Marland Kitchen warfarin (COUMADIN) 2 MG tablet   Oral   Take 1 tablet (2 mg total) by mouth daily. Adjust dose according to PT/INR   15 tablet   0     BP 136/88  Pulse 62  Temp(Src) 97.7 F (36.5 C) (Oral)  Resp 16  SpO2 97%  Physical Exam  Nursing note and vitals reviewed. Constitutional: She appears well-developed and well-nourished. No distress.  HENT:  Head: Normocephalic and atraumatic.  Right Ear: External ear normal.  Left Ear: External ear normal.   Eyes: Conjunctivae are normal. Right eye exhibits no discharge. Left eye exhibits no discharge. No scleral icterus.  Neck: Neck supple. No tracheal deviation present.  Cardiovascular: Normal rate, regular rhythm and intact distal pulses.   Pulmonary/Chest: Effort normal and breath sounds normal. No stridor. No respiratory distress. She has no wheezes. She has no rales.  Abdominal: Soft. Bowel sounds are normal. She exhibits no distension. There is no tenderness. There is no rebound and no guarding.  Musculoskeletal: She exhibits no edema and no tenderness.  Neurological: She is alert. She has normal strength. No sensory deficit. Cranial nerve deficit:  no gross defecits noted. She exhibits normal muscle tone. She displays no seizure activity. Coordination normal.  Skin: Skin is warm and dry. No rash noted.  Psychiatric: She has a normal mood and affect.    ED Course  Procedures (including critical care time)  Labs Reviewed  CBC WITH DIFFERENTIAL - Abnormal; Notable for the following:    RDW 15.8 (*)    All other components within normal limits  COMPREHENSIVE METABOLIC PANEL - Abnormal; Notable for the following:    CO2 17 (*)    Glucose, Bld 139 (*)    Creatinine, Ser 1.39 (*)    Albumin 3.0 (*)    Alkaline Phosphatase 33 (*)    Total Bilirubin 0.2 (*)    GFR calc non Af Amer 35 (*)    GFR calc Af Amer 41 (*)    All other components within normal limits  LIPASE, BLOOD  URINALYSIS, ROUTINE W REFLEX MICROSCOPIC   No results found.   1. Diarrhea       MDM  I reviewed the patient's old records. She actually was admitted to the hospital for pneumonia and urinary tract infection back in December. She did take antibiotics at that time her diarrhea started sometime after that. I discussed the case with Dr. Christella Hartigan from our gastroenterology. I will start the patient on empiric Flagyl. She will followup  in the lumbar office and they will try to arrange for sooner followup than her  February 27 appointment.        Celene Kras, MD 10/02/12 [phone]

## 2012-10-03 NOTE — Telephone Encounter (Signed)
Pt has been rescheduled to see Dr Rhea Belton Friday 10/06/12 pt is aware

## 2012-10-03 NOTE — Telephone Encounter (Signed)
Left message on machine to call back  

## 2012-10-06 ENCOUNTER — Ambulatory Visit (INDEPENDENT_AMBULATORY_CARE_PROVIDER_SITE_OTHER): Payer: Medicare Other | Admitting: Internal Medicine

## 2012-10-06 ENCOUNTER — Encounter: Payer: Self-pay | Admitting: Internal Medicine

## 2012-10-06 VITALS — BP 108/70 | HR 68 | Ht 61.0 in | Wt 125.0 lb

## 2012-10-06 DIAGNOSIS — R197 Diarrhea, unspecified: Secondary | ICD-10-CM

## 2012-10-06 MED ORDER — DIPHENOXYLATE-ATROPINE 2.5-0.025 MG PO TABS: 1.0000 | ORAL_TABLET | Freq: Four times a day (QID) | ORAL | Status: DC | PRN

## 2012-10-06 NOTE — Progress Notes (Signed)
Patient ID: [name], female   DOB: [birth year], 79 y.o.   MRN: [id number]  SUBJECTIVE: HPI [name] is a 79 yo female with complicated past medical history including but not limited to hospitalization due to cardiac/PEA arrest in October 2013, CAD, CAD, DVT on warfarin, temporal arteritis, hypertension, glaucoma, history of gallstones who is seen in consultation at the request of Dr. Concepcion Elk for evaluation of diarrhea.  The patient is accompanied today by her son. Her diarrhea started in late December 2013. It is not occurring on a daily basis, but occurs on average every other day. When it does occur she has 2-4 liquid and urgent stools. On these days it seems to be worsened by eating, though she does report occasional nocturnal symptoms. She has not had any blood in her stool or melena. On her non-diarrhea days, she reports small stools which look like "pebbles". She does not have associated abdominal pain. Occasionally she has nausea but no vomiting. Her appetite was previously poor with weight loss, though she reports of late her appetite has improved and she is now being twice daily. Her weight has been stable of late and in fact she has been able to gain a few pounds. She was seen in the emergency department in evaluation for her diarrhea on 10/02/2012, and empirically started on metronidazole.  She is continuing on metronidazole for the past 2-3 days and she also started a probiotic. She has never had a colonoscopy. There is no family history of colon cancer to her knowledge.  Also of note she was hospitalized in late December with a diagnosis of pneumonia, UTI, hyponatremia  Review of Systems  As per history of present illness, otherwise negative   Past Medical History  Diagnosis Date  . Arthritis   . Hypertension   . History of gout   . Glaucoma   . Cataracts, bilateral   . Hyperlipidemia   . Gallstones   . Cerebrovascular disease   . Hyponatremia     a. H/o 2011 ?SIADH with  seizures, encephalopathy at that time.  . Temporal arteritis   . Cardiac arrest 05/2012    a.  x 2, thought due to renal failure and hypoK;  b.  Echo 10/13: EF 60-65%, grade 1 diastolic dysfunction, dilated RV (RV function appears depressed), mild to moderate TR, PASP 35;   c.  Myoview performed but inconclusive  . Dementia 06/19/2012  . Coronary artery disease     a. Neg stress test 2008. b. ?questionable history - pt thought she had heart cath in 1970's but details were unclear.  . Acute kidney injury   . CKD (chronic kidney disease), stage III   . Seizures     a. Due to hyponatremia, resolved.  . Stroke     a. Details unclear.  Marland Kitchen DVT (deep venous thrombosis)     Current Outpatient Prescriptions  Medication Sig Dispense Refill  . acetaminophen (TYLENOL) 325 MG tablet Take 325 mg by mouth daily as needed. For pain      . bisoprolol (ZEBETA) 5 MG tablet Take 1 tablet (5 mg total) by mouth daily.      . brimonidine (ALPHAGAN) 0.2 % ophthalmic solution Place 1 drop into both eyes 2 (two) times daily.      . cyproheptadine (PERIACTIN) 4 MG tablet Take 4 mg by mouth 2 (two) times daily.      Marland Kitchen escitalopram (LEXAPRO) 5 MG tablet Take 1 tablet (5 mg total) by mouth daily.  30  tablet  0  . ezetimibe (ZETIA) 10 MG tablet Take 1 tablet (10 mg total) by mouth daily.  30 tablet  0  . feeding supplement (ENSURE COMPLETE) LIQD Take 237 mLs by mouth 2 (two) times daily between meals. Also available OTC  30 Bottle  3  . levETIRAcetam (KEPPRA) 250 MG tablet Take 250 mg by mouth 2 (two) times daily.       . metroNIDAZOLE (FLAGYL) 500 MG tablet Take 1 tablet (500 mg total) by mouth 2 (two) times daily.  20 tablet  0  . pantoprazole (PROTONIX) 40 MG tablet Take 40 mg by mouth daily.      . predniSONE (DELTASONE) 10 MG tablet Take 5 mg by mouth daily.      . Probiotic Product (PROBIOTIC DAILY PO) Take 1 tablet by mouth daily. Women's probiotic & digestive aid from [facility]      . timolol (BETIMOL) 0.5 %  ophthalmic solution Place 1 drop into both eyes 2 (two) times daily.      Marland Kitchen warfarin (COUMADIN) 2 MG tablet Take 1 tablet (2 mg total) by mouth daily. Adjust dose according to PT/INR  15 tablet  0   No current facility-administered medications for this visit.    Allergies  Allergen Reactions  . Lactose Intolerance (Gi) Diarrhea and Nausea Only    Family History  Problem Relation Age of Onset  . Hypertension    . Diabetes type II    . Hypertension Mother   . Hypertension Father     History  Substance Use Topics  . Smoking status: Never Smoker   . Smokeless tobacco: Never Used  . Alcohol Use: No     Comment: used to drink beer heavily per old records.     OBJECTIVE: BP 108/70  Pulse 68  Ht 5\' 1"  (1.549 m)  Wt 125 lb (56.7 kg)  BMI 23.63 kg/m2 Constitutional: Chronically ill-appearing female in no acute distress HEENT: Normocephalic and atraumatic. Conjunctivae are normal. No scleral icterus. Neck: Neck supple. Trachea midline. Cardiovascular: Normal rate, regular rhythm and intact distal pulses.  Pulmonary/chest: Effort normal and breath sounds normal. No wheezing, rales or rhonchi. Abdominal: Soft, nontender, nondistended. Bowel sounds active throughout. There are no masses palpable. Well-healed lower abdominal scar Extremities: no clubbing, cyanosis, or edema Neurological: Alert and oriented to person place and time. Skin: Skin is warm and dry. No rashes noted. Psychiatric: Normal mood and affect. Behavior is normal.  Labs and Imaging -- CBC    Component Value Date/Time   WBC 7.5 10/02/2012 1610   WBC 10.3 06/26/2012 0400   RBC 3.95 10/02/2012 1610   HGB 12.8 10/02/2012 1610   HCT 38.1 10/02/2012 1610   PLT 186 10/02/2012 1610   MCV 96.5 10/02/2012 1610   MCH 32.4 10/02/2012 1610   MCHC 33.6 10/02/2012 1610   RDW 15.8* 10/02/2012 1610   LYMPHSABS 2.5 10/02/2012 1610   MONOABS 0.3 10/02/2012 1610   EOSABS 0.0 10/02/2012 1610   BASOSABS 0.0 10/02/2012 1610    CMP      Component Value Date/Time   NA 135 10/02/2012 1610   NA 135* 06/26/2012 0400   K 4.2 10/02/2012 1610   CL 103 10/02/2012 1610   CO2 17* 10/02/2012 1610   GLUCOSE 139* 10/02/2012 1610   BUN 20 10/02/2012 1610   BUN 55* 06/26/2012 0400   CREATININE 1.39* 10/02/2012 1610   CREATININE 1.5* 06/26/2012 0400   CALCIUM 9.2 10/02/2012 1610   PROT 7.6 10/02/2012 1610  ALBUMIN 3.0* 10/02/2012 1610   AST 19 10/02/2012 1610   ALT 18 10/02/2012 1610   ALKPHOS 33* 10/02/2012 1610   BILITOT 0.2* 10/02/2012 1610   GFRNONAA 35* 10/02/2012 1610   GFRAA 41* 10/02/2012 1610   Lipase     Component Value Date/Time   LIPASE 43 10/02/2012 1610    FOBT 08/20/13 -- NEG  CT ABDOMEN AND PELVIS WITHOUT CONTRAST -- 08/15/12   Technique:  Multidetector CT imaging of the abdomen and pelvis was performed following the standard protocol without intravenous contrast.   Comparison: None.   Findings: Lung bases shows some scarring in the right middle lobe.   14 mm lesion in the anterior hepatic dome is probably a cyst. Similar 10 mm well-defined low density lesion in the central right liver is also compatible with a cyst.  Spleen has normal uninfused features.  There is a tiny hiatal hernia.  The stomach is unremarkable.  Duodenal diverticulum noted.  Pancreas and adrenal glands are normal.  Multiple calcified stones are seen in the gallbladder lumen.  The largest stone measures up to about 7 mm in diameter.  No associated biliary dilatation or pericholecystic edema.  Kidneys are unremarkable without renal stones or secondary changes.   No abdominal aortic aneurysm.  No free fluid or lymphadenopathy in the abdomen.   Imaging through the pelvis shows some edema within the retroperitoneal tissues of the left pelvic sidewall tracking along the left common iliac artery and vein. The left common iliac vein is enlarged relative to the right to in the wall is ill-defined. That it has higher attenuation than the other  adjacent vascular structures.  This abnormal appearance tracks down to the left common femoral vein and visualized portion of the upper left superficial femoral vein. Presacral edema/inflammation is evident, presumably from the thrombus in the left pelvic veins.   The oral contrast material has migrated through to the level of the rectal vault.  There is prominent diverticular change in the sigmoid colon but no definite findings of diverticulitis.  No pelvic abscess.  Terminal ileum is normal.   The appendix is not visualized, but there is no edema or inflammation in the region of the cecum.   Uterus is surgically absent no evidence for adnexal mass.   Bone windows reveal no worrisome lytic or sclerotic osseous lesions. .   IMPRESSION: Changes in the left common and external iliac veins as well as the left common femoral vein are compatible with DVT. According EPIC, this is a known finding as a pharmacy consult note at 1446 hours today is for " 79 year old female with bilateral DVT to start on heparin".   Cholelithiasis.   Left colonic diverticulosis without diverticulitis.     Original Report Authenticated By: Kennith Center, M.D.    ASSESSMENT AND PLAN:  79 yo female with complicated past medical history including but not limited to hospitalization due to cardiac/PEA arrest in October 2013, CAD, CAD, DVT on warfarin, temporal arteritis, hypertension, glaucoma, history of gallstones who is seen in consultation at the request of Dr. Concepcion Elk for evaluation of diarrhea.   1.  Diarrhea -- she was empirically started on Flagyl and probiotic, and I have recommended that she continue and complete a course of Flagyl as previously prescribed. I also have recommended she continue with daily probiotics. For completeness a lower stool studies to include C. difficile, culture, ova and parasite, fecal leukocytes.  Reassuringly she had a CT scan of the abdomen and pelvis in late December,  which  was unremarkable from a GI perspective other than left colonic diverticulosis, and her known DVT. Incidentally she did have gallstones without cholecystitis.  Also fecal occult blood testing performed in January was negative, and very recently her hemoglobin and white blood cell count were normal.  If her stool studies are indeed negative, I will give her a trial of Lomotil to be used as needed and as directed to control her loose stools. Her diarrhea is not a daily occurrence, and therefore she will only use the Lomotil on days when she has trouble with loose stools/diarrhea. We discussed flexible sigmoidoscopy versus colonoscopy, but for now we will have her continue and complete empiric antibiotics and give her a trial of an antidiarrheal. We have discussed her chronic comorbidities and how this may make colonoscopy higher risk for her, but if her diarrhea remains refractory, we can reconsider. I will see her again in 4 weeks for followup

## 2012-10-06 NOTE — Patient Instructions (Addendum)
Your physician has requested that you go to the basement for the following lab work before leaving today: Stool studies  Continue taking a probiotic daily.  Finish the Flagyl you received in the ER  You have a follow up appointment with Dr. Rhea Belton on 11/07/2012 @ 2:30pm

## 2012-10-11 ENCOUNTER — Other Ambulatory Visit: Payer: Self-pay | Admitting: Internal Medicine

## 2012-10-11 ENCOUNTER — Other Ambulatory Visit: Payer: Medicare Other

## 2012-10-12 ENCOUNTER — Ambulatory Visit: Payer: Medicare Other | Admitting: Internal Medicine

## 2012-10-12 LAB — OVA AND PARASITE SCREEN

## 2012-10-15 LAB — STOOL CULTURE

## 2012-10-23 ENCOUNTER — Ambulatory Visit: Payer: Medicare Other | Admitting: Physician Assistant

## 2012-11-01 ENCOUNTER — Encounter: Payer: Self-pay | Admitting: Internal Medicine

## 2012-11-04 ENCOUNTER — Emergency Department (HOSPITAL_COMMUNITY)
Admission: EM | Admit: 2012-11-04 | Discharge: 2012-11-04 | Disposition: A | Discharge status: Home / Self Care | Payer: Medicare Other | Attending: Emergency Medicine | Admitting: Emergency Medicine

## 2012-11-04 ENCOUNTER — Encounter (HOSPITAL_COMMUNITY): Payer: Self-pay

## 2012-11-04 DIAGNOSIS — F0390 Unspecified dementia without behavioral disturbance: Secondary | ICD-10-CM | POA: Insufficient documentation

## 2012-11-04 DIAGNOSIS — I129 Hypertensive chronic kidney disease with stage 1 through stage 4 chronic kidney disease, or unspecified chronic kidney disease: Secondary | ICD-10-CM | POA: Insufficient documentation

## 2012-11-04 DIAGNOSIS — N183 Chronic kidney disease, stage 3 unspecified: Secondary | ICD-10-CM | POA: Insufficient documentation

## 2012-11-04 DIAGNOSIS — Z8674 Personal history of sudden cardiac arrest: Secondary | ICD-10-CM | POA: Insufficient documentation

## 2012-11-04 DIAGNOSIS — G40909 Epilepsy, unspecified, not intractable, without status epilepticus: Secondary | ICD-10-CM | POA: Insufficient documentation

## 2012-11-04 DIAGNOSIS — R52 Pain, unspecified: Secondary | ICD-10-CM | POA: Insufficient documentation

## 2012-11-04 DIAGNOSIS — R5381 Other malaise: Secondary | ICD-10-CM | POA: Insufficient documentation

## 2012-11-04 DIAGNOSIS — Z8673 Personal history of transient ischemic attack (TIA), and cerebral infarction without residual deficits: Secondary | ICD-10-CM | POA: Insufficient documentation

## 2012-11-04 DIAGNOSIS — Z79899 Other long term (current) drug therapy: Secondary | ICD-10-CM | POA: Insufficient documentation

## 2012-11-04 DIAGNOSIS — Z8679 Personal history of other diseases of the circulatory system: Secondary | ICD-10-CM | POA: Insufficient documentation

## 2012-11-04 DIAGNOSIS — M25569 Pain in unspecified knee: Secondary | ICD-10-CM | POA: Insufficient documentation

## 2012-11-04 DIAGNOSIS — M129 Arthropathy, unspecified: Secondary | ICD-10-CM | POA: Insufficient documentation

## 2012-11-04 DIAGNOSIS — I2510 Atherosclerotic heart disease of native coronary artery without angina pectoris: Secondary | ICD-10-CM | POA: Insufficient documentation

## 2012-11-04 DIAGNOSIS — Z9861 Coronary angioplasty status: Secondary | ICD-10-CM | POA: Insufficient documentation

## 2012-11-04 DIAGNOSIS — R5383 Other fatigue: Secondary | ICD-10-CM | POA: Insufficient documentation

## 2012-11-04 DIAGNOSIS — R42 Dizziness and giddiness: Secondary | ICD-10-CM | POA: Insufficient documentation

## 2012-11-04 DIAGNOSIS — Z8669 Personal history of other diseases of the nervous system and sense organs: Secondary | ICD-10-CM | POA: Insufficient documentation

## 2012-11-04 DIAGNOSIS — Z7901 Long term (current) use of anticoagulants: Secondary | ICD-10-CM | POA: Insufficient documentation

## 2012-11-04 DIAGNOSIS — M25561 Pain in right knee: Secondary | ICD-10-CM

## 2012-11-04 DIAGNOSIS — Z862 Personal history of diseases of the blood and blood-forming organs and certain disorders involving the immune mechanism: Secondary | ICD-10-CM | POA: Insufficient documentation

## 2012-11-04 DIAGNOSIS — Z86718 Personal history of other venous thrombosis and embolism: Secondary | ICD-10-CM | POA: Insufficient documentation

## 2012-11-04 DIAGNOSIS — Z8639 Personal history of other endocrine, nutritional and metabolic disease: Secondary | ICD-10-CM | POA: Insufficient documentation

## 2012-11-04 DIAGNOSIS — Z8719 Personal history of other diseases of the digestive system: Secondary | ICD-10-CM | POA: Insufficient documentation

## 2012-11-04 DIAGNOSIS — Z9889 Other specified postprocedural states: Secondary | ICD-10-CM | POA: Insufficient documentation

## 2012-11-04 LAB — CBC WITH DIFFERENTIAL/PLATELET
Lymphocytes Relative: 49 % — ABNORMAL HIGH (ref 12–46)
Lymphs Abs: 4.1 10*3/uL — ABNORMAL HIGH (ref 0.7–4.0)
Neutrophils Relative %: 42 % — ABNORMAL LOW (ref 43–77)
Platelets: 161 10*3/uL (ref 150–400)
RBC: 4.48 MIL/uL (ref 3.87–5.11)
WBC: 8.4 10*3/uL (ref 4.0–10.5)

## 2012-11-04 LAB — PROTIME-INR
INR: 1.85 — ABNORMAL HIGH (ref 0.00–1.49)
Prothrombin Time: 20.7 seconds — ABNORMAL HIGH (ref 11.6–15.2)

## 2012-11-04 LAB — URINALYSIS, ROUTINE W REFLEX MICROSCOPIC
Bilirubin Urine: NEGATIVE
Glucose, UA: NEGATIVE mg/dL
Ketones, ur: NEGATIVE mg/dL
Leukocytes, UA: NEGATIVE
pH: 5 (ref 5.0–8.0)

## 2012-11-04 LAB — URINE MICROSCOPIC-ADD ON

## 2012-11-04 NOTE — ED Provider Notes (Signed)
I saw and evaluated the patient, reviewed the resident's note and I agree with the findings and plan.  Knee pain, likely effusion.  Pt on coumadin for DVT.  sub therapeutic INR, but can be managed as outpt.  No CP, SOB.  Pt's general complaint of feeling weak si without focal deficits, no CP, SOB, sweats to suggest cardiac.  Pt's main issue was knee pain which can be treated conservatively and by sports medicine to consider arthrocentesis as outpt if desired.  No suspicion clinically for septic arthritis, able to flex without sig pain on exam with both knees.    Gavin Pound. Reichen Hutzler, MD 11/04/12 1610

## 2012-11-04 NOTE — ED Notes (Signed)
Patient discharged with instructions using the teach back method. Verbalizes an understanding.

## 2012-11-04 NOTE — ED Provider Notes (Signed)
History     CSN: [id number]  Arrival date & time 11/04/12  1445   First MD Initiated Contact with Patient 11/04/12 1513      Chief Complaint  Patient presents with  . Leg Swelling    (Consider location/radiation/quality/duration/timing/severity/associated sxs/prior treatment) Patient is a 79 y.o. female presenting with knee pain.  Knee Pain Location:  Knee Time since incident:  1 day Injury: no   Knee location:  R knee and L knee (R>L) Pain details:    Quality:  Aching and dull   Radiates to:  Does not radiate   Severity:  Moderate   Onset quality:  Gradual   Timing:  Constant   Progression:  Worsening Chronicity:  Recurrent Relieved by:  Acetaminophen Associated symptoms: swelling   Associated symptoms: no decreased ROM, no fever, no muscle weakness, no numbness and no tingling     Past Medical History  Diagnosis Date  . Arthritis   . Hypertension   . History of gout   . Glaucoma   . Cataracts, bilateral   . Hyperlipidemia   . Gallstones   . Cerebrovascular disease   . Hyponatremia     a. H/o 2011 ?SIADH with seizures, encephalopathy at that time.  . Temporal arteritis   . Cardiac arrest 05/2012    a.  x 2, thought due to renal failure and hypoK;  b.  Echo 10/13: EF 60-65%, grade 1 diastolic dysfunction, dilated RV (RV function appears depressed), mild to moderate TR, PASP 35;   c.  Myoview performed but inconclusive  . Dementia 06/19/2012  . Coronary artery disease     a. Neg stress test 2008. b. ?questionable history - pt thought she had heart cath in 1970's but details were unclear.  . Acute kidney injury   . CKD (chronic kidney disease), stage III   . Seizures     a. Due to hyponatremia, resolved.  . Stroke     a. Details unclear.  Marland Kitchen DVT (deep venous thrombosis)     Past Surgical History  Procedure Laterality Date  . Eye surgery    . Abdominal hysterectomy    . Coronary angioplasty with stent placement      Family History  Problem Relation Age  of Onset  . Hypertension    . Diabetes type II    . Hypertension Mother   . Hypertension Father     History  Substance Use Topics  . Smoking status: Never Smoker   . Smokeless tobacco: Never Used  . Alcohol Use: No     Comment: used to drink beer heavily per old records.     OB History   Grav Para Term Preterm Abortions TAB SAB Ect Mult Living                  Review of Systems  Constitutional: Negative for fever.  HENT: Negative for congestion.   Respiratory: Negative for cough and shortness of breath.   Cardiovascular: Negative for chest pain.  Gastrointestinal: Negative for nausea, vomiting, abdominal pain and diarrhea.  Genitourinary: Negative for difficulty urinating.  Neurological: Positive for dizziness and weakness (generalize).  All other systems reviewed and are negative.    Allergies  Lactose intolerance (gi)  Home Medications   Current Outpatient Rx  Name  Route  Sig  Dispense  Refill  . acetaminophen (TYLENOL) 325 MG tablet   Oral   Take 325 mg by mouth daily as needed for pain. For pain         .  bisoprolol (ZEBETA) 5 MG tablet   Oral   Take 1 tablet (5 mg total) by mouth daily.         . brimonidine (ALPHAGAN) 0.2 % ophthalmic solution   Both Eyes   Place 1 drop into both eyes 2 (two) times daily.         . cyproheptadine (PERIACTIN) 4 MG tablet   Oral   Take 4 mg by mouth 2 (two) times daily.         . diphenoxylate-atropine (LOMOTIL) 2.5-0.025 MG per tablet   Oral   Take 1 tablet by mouth 4 (four) times daily as needed for diarrhea or loose stools.   30 tablet   0   . escitalopram (LEXAPRO) 5 MG tablet   Oral   Take 1 tablet (5 mg total) by mouth daily.   30 tablet   0   . ezetimibe (ZETIA) 10 MG tablet   Oral   Take 1 tablet (10 mg total) by mouth daily.   30 tablet   0   . levETIRAcetam (KEPPRA) 250 MG tablet   Oral   Take 250 mg by mouth 2 (two) times daily.          . pantoprazole (PROTONIX) 40 MG tablet    Oral   Take 40 mg by mouth daily.         . Probiotic Product (PROBIOTIC DAILY PO)   Oral   Take 1 tablet by mouth daily. Women's probiotic & digestive aid from [facility]         . timolol (BETIMOL) 0.5 % ophthalmic solution   Both Eyes   Place 1 drop into both eyes 2 (two) times daily.         Marland Kitchen warfarin (COUMADIN) 2 MG tablet   Oral   Take 1 tablet (2 mg total) by mouth daily. Adjust dose according to PT/INR   15 tablet   0   . feeding supplement (ENSURE COMPLETE) LIQD   Oral   Take 237 mLs by mouth 2 (two) times daily between meals. Also available OTC   30 Bottle   3     There were no vitals taken for this visit.  Physical Exam  Nursing note and vitals reviewed. Constitutional: She is oriented to person, place, and time. She appears well-developed and well-nourished. No distress.  HENT:  Head: Normocephalic and atraumatic.  Mouth/Throat: Oropharynx is clear and moist.  Eyes: Conjunctivae are normal. No scleral icterus.  Right pupil nonreactive, left pupil dilated with minimal reactivity  Neck: Neck supple.  Cardiovascular: Normal rate, regular rhythm, normal heart sounds and intact distal pulses.   No murmur heard. Pulmonary/Chest: Effort normal and breath sounds normal. No stridor. No respiratory distress. She has no rales.  Abdominal: Soft. Bowel sounds are normal. She exhibits no distension. There is no tenderness.  Musculoskeletal: Normal range of motion. She exhibits no edema and no tenderness.       Right knee: She exhibits effusion. She exhibits normal range of motion, no deformity and no erythema. Tenderness found.       Left knee: She exhibits normal range of motion, no swelling, no effusion, no deformity and no erythema. Tenderness found.  Neurological: She is alert and oriented to person, place, and time.  Skin: Skin is warm and dry. No rash noted.  Psychiatric: She has a normal mood and affect. Her behavior is normal.    ED Course  Procedures (including  critical care time)  Labs Reviewed  CBC WITH DIFFERENTIAL - Abnormal; Notable for the following:    Neutrophils Relative 42 (*)    Lymphocytes Relative 49 (*)    Lymphs Abs 4.1 (*)    All other components within normal limits  PROTIME-INR - Abnormal; Notable for the following:    Prothrombin Time 20.7 (*)    INR 1.85 (*)    All other components within normal limits  URINALYSIS, ROUTINE W REFLEX MICROSCOPIC - Abnormal; Notable for the following:    Protein, ur 100 (*)    All other components within normal limits  SEDIMENTATION RATE - Abnormal; Notable for the following:    Sed Rate 75 (*)    All other components within normal limits  URINE MICROSCOPIC-ADD ON - Abnormal; Notable for the following:    Squamous Epithelial / LPF FEW (*)    All other components within normal limits  URINE CULTURE  C-REACTIVE PROTEIN   No results found.   1. Knee pain, acute, right       MDM   3:31 PM 79 yo female with hx of arthritis presenting with right knee effusion and bilateral knee pain since yesterday.  On coumadin for DVT.  Clinical picture today not c/w DVT.  Right knee effusion, but not hot, not red, no decreased ROM.  Do not suspect septic joint.  Likely arthritic effusion.  Since on coumadin, will check level, but hemarthrosis unlikely based on exam.  Son thinks she may have a UTI because of some generalized weakness and dizziness.  Will check. UA.    5:28 PM UA negative.  bloodwork unremarkable.  INR slightly subtherapeutic.  Plan to advise close PCP follow up.  As regards her generalized weakness, do not see an obvious source of this on history, exam, or lab testing.  Nonfocal exam.  Plan dc home with PCP follow up.       Rennis Petty, MD 11/04/12 [phone]

## 2012-11-04 NOTE — ED Notes (Signed)
Pt with hx of DVT to lower extremities diagnosed in 05/2012.  Pt saw her PCP yesterday for coagulation levels.  Last night pt began having swelling and pain to both knees.  Edema noted to R > L.  Pt c/o pain anterior and posterior of knee.

## 2012-11-05 LAB — URINE CULTURE

## 2012-11-07 ENCOUNTER — Ambulatory Visit: Payer: Medicare Other | Admitting: Internal Medicine

## 2012-11-10 ENCOUNTER — Emergency Department (HOSPITAL_COMMUNITY)
Admission: EM | Admit: 2012-11-10 | Discharge: 2012-11-10 | Disposition: A | Discharge status: Home / Self Care | Payer: Medicare Other | Attending: Emergency Medicine | Admitting: Emergency Medicine

## 2012-11-10 ENCOUNTER — Encounter (HOSPITAL_COMMUNITY): Payer: Self-pay | Admitting: *Deleted

## 2012-11-10 ENCOUNTER — Emergency Department (HOSPITAL_COMMUNITY): Payer: Medicare Other

## 2012-11-10 DIAGNOSIS — Z79899 Other long term (current) drug therapy: Secondary | ICD-10-CM | POA: Insufficient documentation

## 2012-11-10 DIAGNOSIS — Z8719 Personal history of other diseases of the digestive system: Secondary | ICD-10-CM | POA: Insufficient documentation

## 2012-11-10 DIAGNOSIS — I129 Hypertensive chronic kidney disease with stage 1 through stage 4 chronic kidney disease, or unspecified chronic kidney disease: Secondary | ICD-10-CM | POA: Insufficient documentation

## 2012-11-10 DIAGNOSIS — E785 Hyperlipidemia, unspecified: Secondary | ICD-10-CM | POA: Insufficient documentation

## 2012-11-10 DIAGNOSIS — Z8679 Personal history of other diseases of the circulatory system: Secondary | ICD-10-CM | POA: Insufficient documentation

## 2012-11-10 DIAGNOSIS — Z8673 Personal history of transient ischemic attack (TIA), and cerebral infarction without residual deficits: Secondary | ICD-10-CM | POA: Insufficient documentation

## 2012-11-10 DIAGNOSIS — M1120 Other chondrocalcinosis, unspecified site: Secondary | ICD-10-CM | POA: Insufficient documentation

## 2012-11-10 DIAGNOSIS — Z9861 Coronary angioplasty status: Secondary | ICD-10-CM | POA: Insufficient documentation

## 2012-11-10 DIAGNOSIS — Z862 Personal history of diseases of the blood and blood-forming organs and certain disorders involving the immune mechanism: Secondary | ICD-10-CM | POA: Insufficient documentation

## 2012-11-10 DIAGNOSIS — Z8674 Personal history of sudden cardiac arrest: Secondary | ICD-10-CM | POA: Insufficient documentation

## 2012-11-10 DIAGNOSIS — Z86718 Personal history of other venous thrombosis and embolism: Secondary | ICD-10-CM | POA: Insufficient documentation

## 2012-11-10 DIAGNOSIS — Z7901 Long term (current) use of anticoagulants: Secondary | ICD-10-CM | POA: Insufficient documentation

## 2012-11-10 DIAGNOSIS — G40909 Epilepsy, unspecified, not intractable, without status epilepticus: Secondary | ICD-10-CM | POA: Insufficient documentation

## 2012-11-10 DIAGNOSIS — M25562 Pain in left knee: Secondary | ICD-10-CM

## 2012-11-10 DIAGNOSIS — Z8669 Personal history of other diseases of the nervous system and sense organs: Secondary | ICD-10-CM | POA: Insufficient documentation

## 2012-11-10 DIAGNOSIS — Z8639 Personal history of other endocrine, nutritional and metabolic disease: Secondary | ICD-10-CM | POA: Insufficient documentation

## 2012-11-10 DIAGNOSIS — I2510 Atherosclerotic heart disease of native coronary artery without angina pectoris: Secondary | ICD-10-CM | POA: Insufficient documentation

## 2012-11-10 DIAGNOSIS — N183 Chronic kidney disease, stage 3 unspecified: Secondary | ICD-10-CM | POA: Insufficient documentation

## 2012-11-10 MED ORDER — HYDROCODONE-ACETAMINOPHEN 5-325 MG PO TABS
1.0000 | ORAL_TABLET | Freq: Once | ORAL | Status: AC
  Administered 2012-11-10: 1 via ORAL
  Filled 2012-11-10: qty 1

## 2012-11-10 MED ORDER — PREDNISONE 10 MG PO TABS: 60.0000 mg | ORAL_TABLET | Freq: Every day | ORAL | Status: AC

## 2012-11-10 MED ORDER — HYDROCODONE-ACETAMINOPHEN 5-325 MG PO TABS: 1.0000 | ORAL_TABLET | ORAL | Status: DC | PRN

## 2012-11-10 MED ORDER — PREDNISONE 20 MG PO TABS
60.0000 mg | ORAL_TABLET | Freq: Once | ORAL | Status: AC
  Administered 2012-11-10: 60 mg via ORAL
  Filled 2012-11-10: qty 3

## 2012-11-10 NOTE — ED Provider Notes (Signed)
79 year old female has been having pain in both knees for several weeks. Pain is not getting better or worse. Her right knee is worse than her left. She has some renal problems which precluded her taking NSAIDs and she is on warfarin for DVT. On exam, she has a large effusion in her right knee and as small to moderate effusion her left knee. There or the joint exam is unremarkable. X-ray shows evidence of chondrocalcinosis and I strongly suspect that she has chondrocalcinosis deposition disease with acute attack of pseudogout. She does relate that she has had attacks of what were diagnosed as gout in the past. She will be treated with a course of steroids as well as narcotics for pain. If she fails that, she should be considered for intra-articular steroid injections.  Images viewed by me.  I saw and evaluated the patient, reviewed the resident's note and I agree with the findings and plan.   Dione Booze, MD 11/10/12 [phone]

## 2012-11-10 NOTE — ED Notes (Addendum)
Pt was seen here last week and dx with acute arthritis to R knee.  She was not given a prescription for pain meds and was told to follow up with her pcp.  Pt states pain is increasing.  Called pcp who stated come to ED b/c he cannot see her anytime soon.  R knee with edema.  No redness noted.  Per son, pt now needs help even to go to th bathroom.  Pt is taking coumadin for bil LE blood clots.

## 2012-11-10 NOTE — ED Provider Notes (Signed)
History     CSN: [id number]  Arrival date & time 11/10/12  1434   First MD Initiated Contact with Patient 11/10/12 1652      Chief Complaint  Patient presents with  . Foot Pain    (Consider location/radiation/quality/duration/timing/severity/associated sxs/prior treatment) Patient is a 79 y.o. female presenting with knee pain.  Knee Pain Location:  Knee Affected location: both, R>L. Pain details:    Quality:  Dull   Radiates to: back of knee.   Severity:  No pain   Onset quality:  Gradual   Timing:  Constant   Progression:  Worsening Chronicity:  Chronic Relieved by:  Nothing Worsened by:  Bearing weight Ineffective treatments:  Acetaminophen Associated symptoms: swelling   Associated symptoms: no decreased ROM, no fever and no numbness     Past Medical History  Diagnosis Date  . Arthritis   . Hypertension   . History of gout   . Glaucoma   . Cataracts, bilateral   . Hyperlipidemia   . Gallstones   . Cerebrovascular disease   . Hyponatremia     a. H/o 2011 ?SIADH with seizures, encephalopathy at that time.  . Temporal arteritis   . Cardiac arrest 05/2012    a.  x 2, thought due to renal failure and hypoK;  b.  Echo 10/13: EF 60-65%, grade 1 diastolic dysfunction, dilated RV (RV function appears depressed), mild to moderate TR, PASP 35;   c.  Myoview performed but inconclusive  . Dementia 06/19/2012  . Coronary artery disease     a. Neg stress test 2008. b. ?questionable history - pt thought she had heart cath in 1970's but details were unclear.  . Acute kidney injury   . CKD (chronic kidney disease), stage III   . Seizures     a. Due to hyponatremia, resolved.  . Stroke     a. Details unclear.  Marland Kitchen DVT (deep venous thrombosis)     Past Surgical History  Procedure Laterality Date  . Eye surgery    . Abdominal hysterectomy    . Coronary angioplasty with stent placement      Family History  Problem Relation Age of Onset  . Hypertension    . Diabetes  type II    . Hypertension Mother   . Hypertension Father     History  Substance Use Topics  . Smoking status: Never Smoker   . Smokeless tobacco: Never Used  . Alcohol Use: No     Comment: used to drink beer heavily per old records.     OB History   Grav Para Term Preterm Abortions TAB SAB Ect Mult Living                  Review of Systems  Constitutional: Negative for fever.  HENT: Negative for congestion.   Respiratory: Negative for cough and shortness of breath.   Cardiovascular: Negative for chest pain.  Gastrointestinal: Negative for nausea, vomiting, abdominal pain and diarrhea.  Genitourinary: Negative for difficulty urinating.  All other systems reviewed and are negative.    Allergies  Lactose intolerance (gi)  Home Medications   Current Outpatient Rx  Name  Route  Sig  Dispense  Refill  . acetaminophen (TYLENOL) 325 MG tablet   Oral   Take 325 mg by mouth daily as needed for pain. For pain         . bisoprolol (ZEBETA) 5 MG tablet   Oral   Take 1 tablet (5 mg total) by  mouth daily.         . brimonidine (ALPHAGAN) 0.2 % ophthalmic solution   Both Eyes   Place 1 drop into both eyes 2 (two) times daily.         . cyproheptadine (PERIACTIN) 4 MG tablet   Oral   Take 4 mg by mouth 2 (two) times daily.         . diphenoxylate-atropine (LOMOTIL) 2.5-0.025 MG per tablet   Oral   Take 1 tablet by mouth 4 (four) times daily as needed for diarrhea or loose stools.   30 tablet   0   . escitalopram (LEXAPRO) 5 MG tablet   Oral   Take 1 tablet (5 mg total) by mouth daily.   30 tablet   0   . ezetimibe (ZETIA) 10 MG tablet   Oral   Take 1 tablet (10 mg total) by mouth daily.   30 tablet   0   . feeding supplement (ENSURE COMPLETE) LIQD   Oral   Take 237 mLs by mouth 2 (two) times daily between meals. Also available OTC   30 Bottle   3   . levETIRAcetam (KEPPRA) 250 MG tablet   Oral   Take 250 mg by mouth 2 (two) times daily.           . pantoprazole (PROTONIX) 40 MG tablet   Oral   Take 40 mg by mouth daily.         . Probiotic Product (PROBIOTIC DAILY PO)   Oral   Take 1 tablet by mouth daily. Women's probiotic & digestive aid from [facility]         . timolol (BETIMOL) 0.5 % ophthalmic solution   Both Eyes   Place 1 drop into both eyes 2 (two) times daily.         Marland Kitchen warfarin (COUMADIN) 2 MG tablet   Oral   Take 1 tablet (2 mg total) by mouth daily. Adjust dose according to PT/INR   15 tablet   0     BP 131/78  Pulse 72  Temp(Src) 97.5 F (36.4 C) (Oral)  Resp 16  Ht 5\' 1"  (1.549 m)  Wt 120 lb (54.432 kg)  BMI 22.69 kg/m2  SpO2 99%  Physical Exam  Nursing note and vitals reviewed. Constitutional: She is oriented to person, place, and time. She appears well-developed and well-nourished. No distress.  HENT:  Head: Normocephalic and atraumatic.  Mouth/Throat: Oropharynx is clear and moist.  Eyes: Conjunctivae are normal. Pupils are equal, round, and reactive to light. No scleral icterus.  Neck: Neck supple.  Cardiovascular: Normal rate, regular rhythm, normal heart sounds and intact distal pulses.   No murmur heard. Pulmonary/Chest: Effort normal and breath sounds normal. No stridor. No respiratory distress. She has no rales.  Abdominal: Soft. Bowel sounds are normal. She exhibits no distension. There is no tenderness.  Musculoskeletal: Normal range of motion.       Right knee: She exhibits effusion. She exhibits normal range of motion. Tenderness found.       Left knee: She exhibits no effusion. Tenderness found.  Neurological: She is alert and oriented to person, place, and time.  Skin: Skin is warm and dry. No rash noted.  Psychiatric: She has a normal mood and affect. Her behavior is normal.    ED Course  Procedures (including critical care time)  Labs Reviewed - No data to display Dg Knee Complete 4 Views Right  11/10/2012  *RADIOLOGY REPORT*  Clinical  Data: Knee pain and swelling  RIGHT  KNEE - COMPLETE 4+ VIEW  Comparison: None.  Findings: Minimal chondrocalcinosis in the lateral compartment. Normal alignment without acute fracture.  Small joint effusion suspected on the lateral view.  IMPRESSION: No acute osseous finding.  Small joint effusion  Chondrocalcinosis   Original Report Authenticated By: Judie Petit. Miles Costain, M.D.    All radiology studies independently viewed by me.     1. Pseudogout   2. Knee pain, bilateral       MDM   79 yo female with hx of arthritis presenting with bilateral knee pain, R>L.  Has a right knee effusion.  Seen here last week for same, and has been unable to get in to see PCP in the interim.  No fevers, no redness, knee not warm, able to range fully.  Knee appears similar to when I saw it last week.  No concern for septic arthritis.  She is unable to take NSAIDs because of renal insufficiency.  Will try Norco for pain.    Norco has improved pain.  Xray shows chondrocalcinosis.  Plan to treat for pseudogout with steroid burst and norco at home.  Do not plan to tap knee secondary to no concern for infection and on coumadin.  Have advised PCP followup.       Rennis Petty, MD 11/10/12 1900

## 2012-11-16 NOTE — Telephone Encounter (Signed)
PT NOT BILLED SAME DAY CX FEE °

## 2012-11-22 ENCOUNTER — Other Ambulatory Visit: Payer: Self-pay | Admitting: Gastroenterology

## 2012-11-22 DIAGNOSIS — R197 Diarrhea, unspecified: Secondary | ICD-10-CM

## 2012-11-22 MED ORDER — DIPHENOXYLATE-ATROPINE 2.5-0.025 MG PO TABS: 1.0000 | ORAL_TABLET | Freq: Four times a day (QID) | ORAL | Status: DC | PRN

## 2012-12-01 ENCOUNTER — Encounter: Payer: Self-pay | Admitting: Physician Assistant

## 2012-12-27 ENCOUNTER — Other Ambulatory Visit: Payer: Self-pay | Admitting: Internal Medicine

## 2012-12-31 ENCOUNTER — Other Ambulatory Visit: Payer: Self-pay | Admitting: Internal Medicine

## 2013-01-01 ENCOUNTER — Other Ambulatory Visit: Payer: Self-pay | Admitting: Gastroenterology

## 2013-03-21 ENCOUNTER — Other Ambulatory Visit: Payer: Self-pay

## 2013-05-01 IMAGING — CR DG CHEST 2V
2 series · 2 of 2 positions shown · non-contrast
Comparison: 06/07/2012

CLINICAL DATA: Chest pain

CHEST - 2 VIEW

[x chest ap]
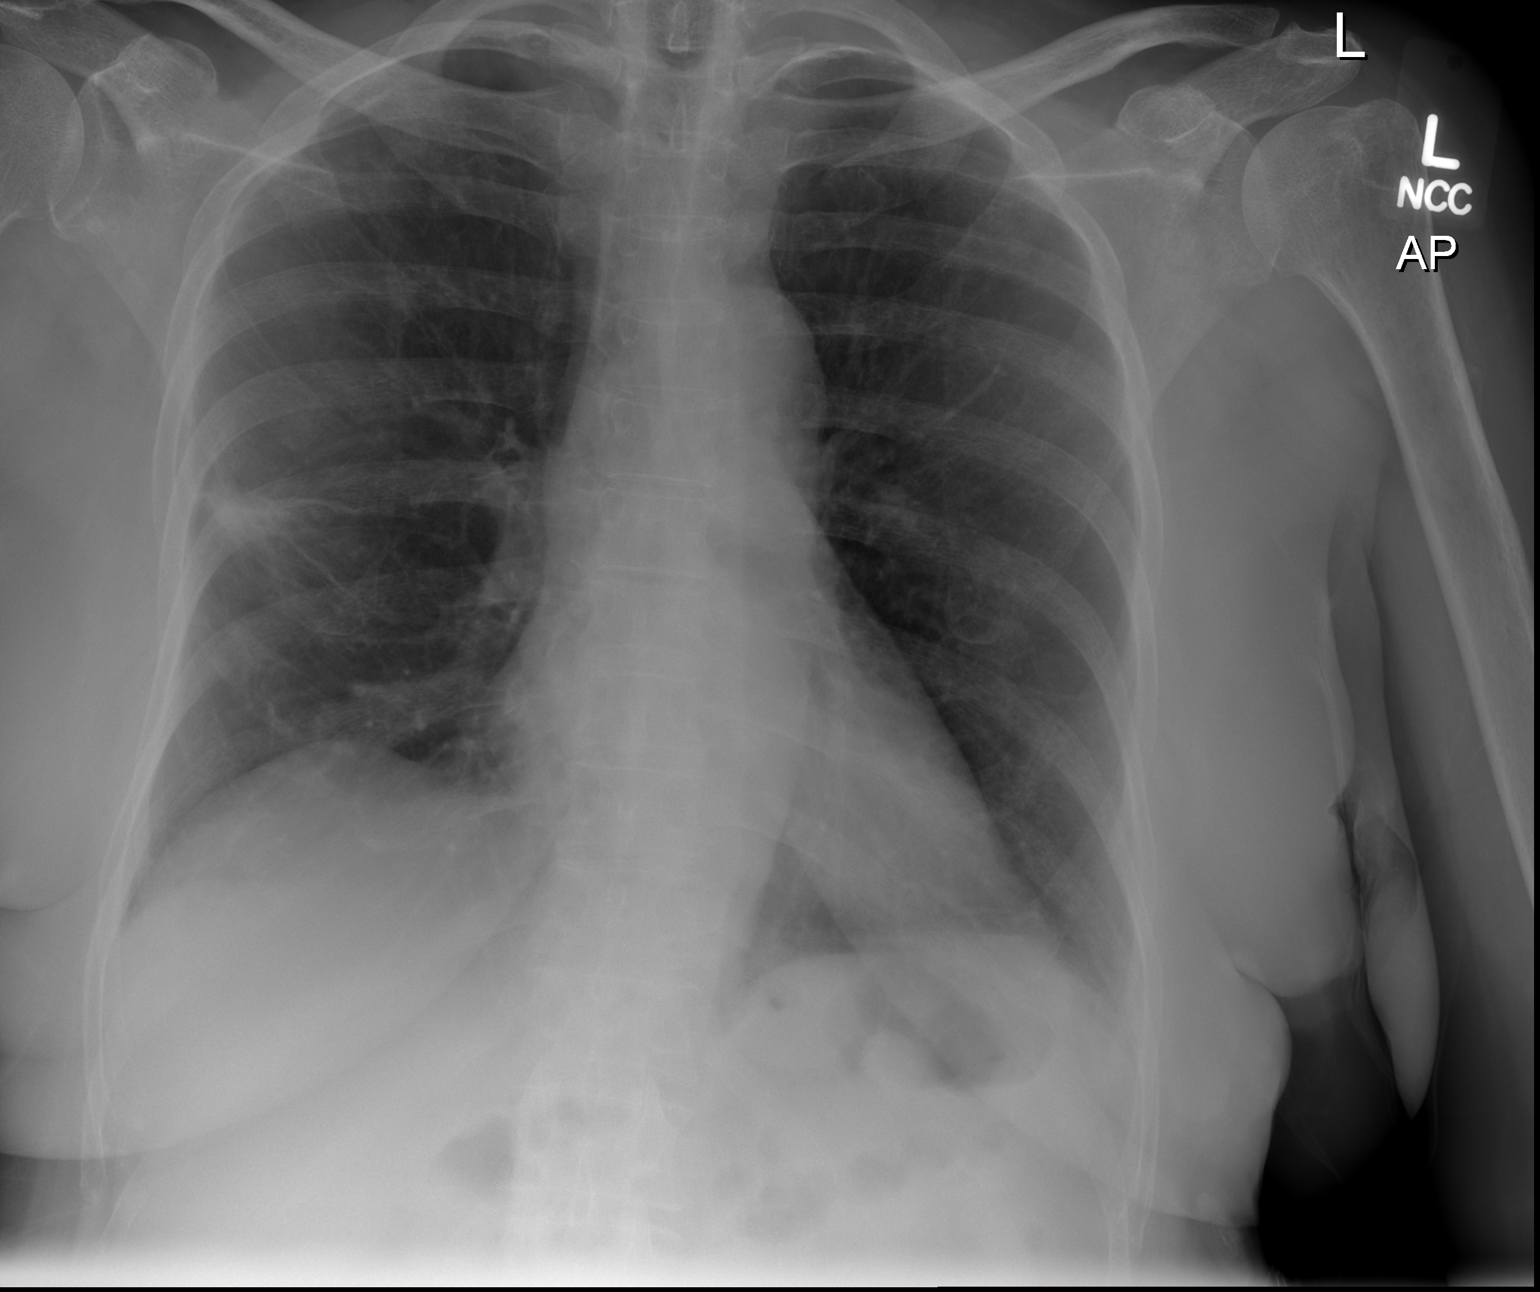

[w chest lat]
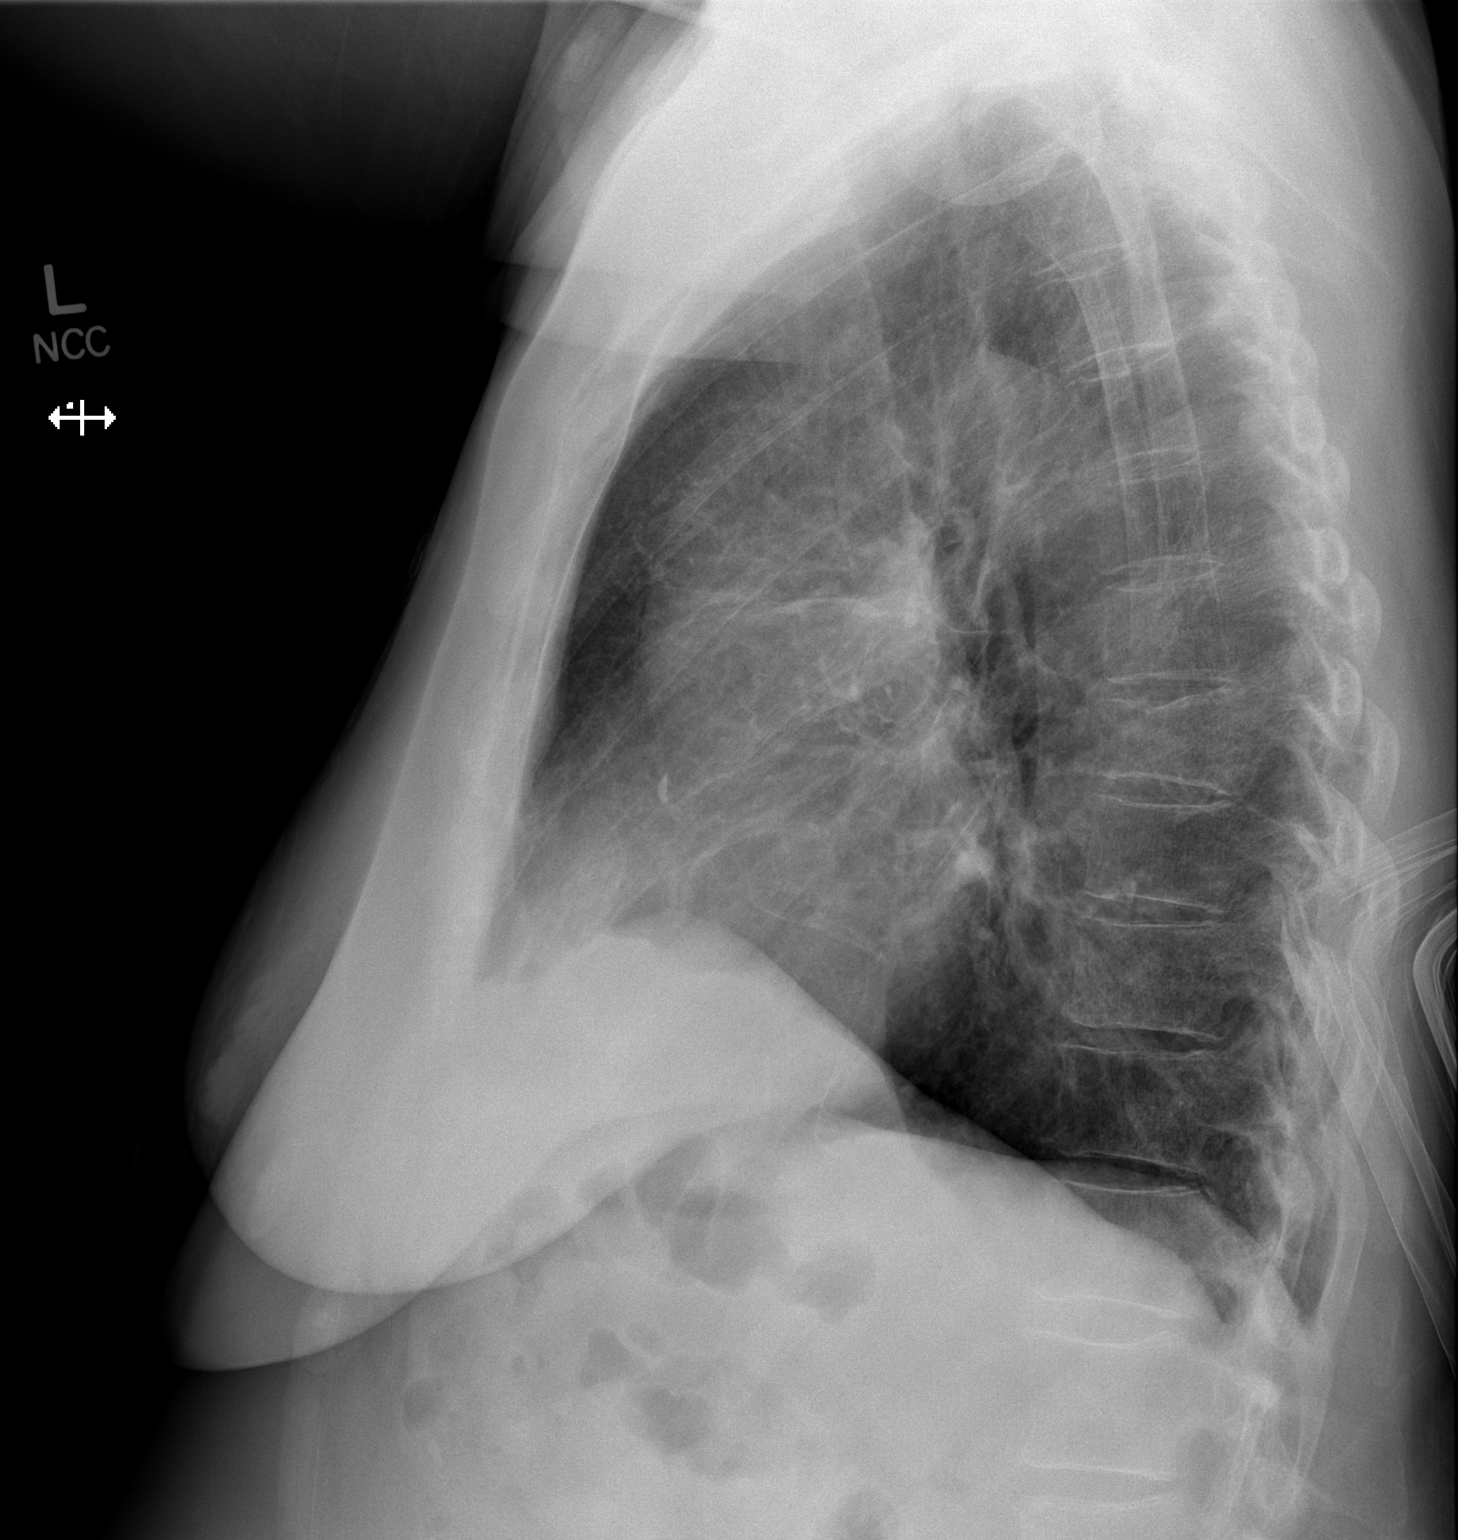

[2 of 2 positions shown; findings below may reference images not displayed]

FINDINGS: Normal heart size.  No effusion or edema peri right
midlung opacity is identified measuring 1.9 cm.  New from previous
exam.  Left lung is clear.
IMPRESSION: 1.  Indeterminant opacity within the right midlung.  In the acute
setting this may represent a focal area of pneumonia.  Radiographic
followup is recommended to ensure resolution.  If this does not
resolve then CT of the chest should be considered.

## 2013-05-19 ENCOUNTER — Emergency Department (INDEPENDENT_AMBULATORY_CARE_PROVIDER_SITE_OTHER): Payer: Medicare Other

## 2013-05-19 ENCOUNTER — Emergency Department (HOSPITAL_COMMUNITY)
Admission: EM | Admit: 2013-05-19 | Discharge: 2013-05-19 | Disposition: A | Discharge status: Home / Self Care | Payer: Medicare Other | Source: Home / Self Care | Attending: Emergency Medicine | Admitting: Emergency Medicine

## 2013-05-19 ENCOUNTER — Encounter (HOSPITAL_COMMUNITY): Payer: Self-pay | Admitting: *Deleted

## 2013-05-19 DIAGNOSIS — M109 Gout, unspecified: Secondary | ICD-10-CM

## 2013-05-19 MED ORDER — TRAMADOL HCL 50 MG PO TABS: 50.0000 mg | ORAL_TABLET | Freq: Three times a day (TID) | ORAL | Status: DC | PRN

## 2013-05-19 MED ORDER — METHYLPREDNISOLONE ACETATE 80 MG/ML IJ SUSP
80.0000 mg | Freq: Once | INTRAMUSCULAR | Status: AC
  Administered 2013-05-19: 80 mg via INTRAMUSCULAR

## 2013-05-19 MED ORDER — METHYLPREDNISOLONE ACETATE 80 MG/ML IJ SUSP
INTRAMUSCULAR | Status: AC
  Filled 2013-05-19: qty 1

## 2013-05-19 NOTE — ED Notes (Signed)
Pt     Was noted  By  Family      To  Have  A  Swollen      Deformed  l  Wrist     yest        -  denys  specefic  Injury  However lives  Alone      She also  Has  Pain  /  Swelling  Decreased  rom  To  l  Shoulder  Area   As well                  She  denys  Any  Blacking  Out  Or  Any  Chest  Pain

## 2013-05-19 NOTE — ED Provider Notes (Signed)
Chief Complaint:   Chief Complaint  Patient presents with  . Wrist Pain    History of Present Illness:   [name] is an 79 year old female who lives by herself. She's had a three-day history of pain and swelling in her left wrist. It hurts to move the wrist. She denies any injury to the wrist. She's not had any falls. She does have a history of gout in the past. She denies fever or chills. She also has some pain in her left shoulder, but this is the shoulder that she got a flu shot and a couple days before. She is able to move her shoulder little bit, but it sore and achy. She denies any pain in her head or neck, chest, back, abdomen, right arm, her legs. She is able to stand and walk. She denies numbness or tingling.  Review of Systems:  Other than noted above, the patient denies any of the following symptoms: Systemic:  No fevers, chills, sweats, or aches.  No fatigue or tiredness. Musculoskeletal:  No joint pain, arthritis, bursitis, swelling, back pain, or neck pain. Neurological:  No muscular weakness, paresthesias, headache, or trouble with speech or coordination.  No dizziness.  PMFSH:  Past medical history, family history, social history, meds, and allergies were reviewed.  Current meds include sotalol, Lexapro, Zetia, Keppra, and Protonix. She has a history of arthritis, hypertension, gout, glaucoma, cataracts, hyperlipidemia, gallstones, she relaxed her disease, hyponatremia, temporal arteritis, cardiac arrest, dementia, coronary artery disease, chronic kidney disease, seizures secondary to hyponatremia, stroke, and DVT.  Physical Exam:   Vital signs:  BP 136/85  Pulse 80  Temp(Src) 99 F (37.2 C) (Oral)  Resp 16  SpO2 96% Gen:  Alert and oriented times 3.  In no distress. Musculoskeletal: Exam of the wrist reveals swelling, slight redness, heat, and tenderness to palpation. The wrist is tender with movement. Exam of the shoulder shows slight swelling slight tenderness to  palpation but a fairly good range of motion with minimal pain.  Otherwise, all joints had a full a ROM with no swelling, bruising or deformity.  No edema, pulses full. Extremities were warm and pink.  Capillary refill was brisk.  Skin:  Clear, warm and dry.  No rash. Neuro:  Alert and oriented times 3.  Muscle strength was normal.  Sensation was intact to light touch.   Radiology:  Dg Wrist Complete Left  05/19/2013   CLINICAL DATA:  Pain and swelling of left wrist.  EXAM: LEFT WRIST - COMPLETE 3+ VIEW  COMPARISON:  None.  FINDINGS: There is no evidence of fracture or dislocation. Degenerative changes are seen involving the carpal bones and radiocarpal joint. No bony lesion or destruction is identified. Soft tissues show vascular calcifications involving arteries consistent with underlying diabetes.  IMPRESSION: Degenerative arthritis of the left wrist. No acute findings.   Electronically Signed   By: Irish Lack M.D.   On: 05/19/2013 19:27   Dg Shoulder Left  05/19/2013   CLINICAL DATA:  Left shoulder pain  EXAM: LEFT SHOULDER - 2+ VIEW  COMPARISON:  None.  FINDINGS: There is no evidence of fracture or dislocation. There is no evidence of arthropathy or other focal bone abnormality. Soft tissues are unremarkable.  IMPRESSION: No acute abnormality noted.   Electronically Signed   By: Alcide Clever M.D.   On: 05/19/2013 19:27   I reviewed the images independently and personally and concur with the radiologist's findings.  Course in Urgent Care Center:   Given  Depo-Medrol 80 mg IM for presumptive gout attack. Due to age and multiple comorbidities, I definitely want to avoid nonsteroidal anti-inflammatories and colchicine. She was given a wrist splint and a sling. Once her wrist and shoulder start feeling better she can get out of these.  Assessment:  The encounter diagnosis was Gout attack.  No evidence of fracture or infection.  Plan:   1.  Meds:  The following meds were prescribed:   New  Prescriptions   TRAMADOL (ULTRAM) 50 MG TABLET    Take 1 tablet (50 mg total) by mouth every 8 (eight) hours as needed for pain.    2.  Patient Education/Counseling:  The patient was given appropriate handouts, self care instructions, and instructed in symptomatic relief, including rest and activity, elevation, application of ice and compression.  Her sons can stay within the next couple days since she won't be able to use her left arm much.  3.  Follow up:  The patient was told to follow up if no better in 3 to 4 days, if becoming worse in any way, and given some red flag symptoms such as worsening pain or any fever which would prompt immediate return.  Follow up here if needed.     Reuben Likes, MD 05/19/13 [phone]

## 2013-06-21 ENCOUNTER — Other Ambulatory Visit: Payer: Self-pay

## 2014-01-04 ENCOUNTER — Emergency Department (HOSPITAL_COMMUNITY)
Admission: EM | Admit: 2014-01-04 | Discharge: 2014-01-04 | Disposition: A | Discharge status: Home / Self Care | Payer: Medicare Other | Attending: Emergency Medicine | Admitting: Emergency Medicine

## 2014-01-04 ENCOUNTER — Encounter (HOSPITAL_COMMUNITY): Payer: Self-pay | Admitting: Emergency Medicine

## 2014-01-04 DIAGNOSIS — N183 Chronic kidney disease, stage 3 unspecified: Secondary | ICD-10-CM | POA: Insufficient documentation

## 2014-01-04 DIAGNOSIS — Z8669 Personal history of other diseases of the nervous system and sense organs: Secondary | ICD-10-CM | POA: Insufficient documentation

## 2014-01-04 DIAGNOSIS — I129 Hypertensive chronic kidney disease with stage 1 through stage 4 chronic kidney disease, or unspecified chronic kidney disease: Secondary | ICD-10-CM | POA: Insufficient documentation

## 2014-01-04 DIAGNOSIS — Z79899 Other long term (current) drug therapy: Secondary | ICD-10-CM | POA: Insufficient documentation

## 2014-01-04 DIAGNOSIS — I2510 Atherosclerotic heart disease of native coronary artery without angina pectoris: Secondary | ICD-10-CM | POA: Insufficient documentation

## 2014-01-04 DIAGNOSIS — N390 Urinary tract infection, site not specified: Secondary | ICD-10-CM | POA: Insufficient documentation

## 2014-01-04 DIAGNOSIS — R197 Diarrhea, unspecified: Secondary | ICD-10-CM

## 2014-01-04 DIAGNOSIS — G40909 Epilepsy, unspecified, not intractable, without status epilepticus: Secondary | ICD-10-CM | POA: Insufficient documentation

## 2014-01-04 DIAGNOSIS — F0390 Unspecified dementia without behavioral disturbance: Secondary | ICD-10-CM | POA: Insufficient documentation

## 2014-01-04 DIAGNOSIS — M109 Gout, unspecified: Secondary | ICD-10-CM | POA: Insufficient documentation

## 2014-01-04 DIAGNOSIS — E785 Hyperlipidemia, unspecified: Secondary | ICD-10-CM | POA: Insufficient documentation

## 2014-01-04 DIAGNOSIS — Z9861 Coronary angioplasty status: Secondary | ICD-10-CM | POA: Insufficient documentation

## 2014-01-04 DIAGNOSIS — E860 Dehydration: Secondary | ICD-10-CM | POA: Insufficient documentation

## 2014-01-04 DIAGNOSIS — N179 Acute kidney failure, unspecified: Secondary | ICD-10-CM | POA: Insufficient documentation

## 2014-01-04 DIAGNOSIS — Z86718 Personal history of other venous thrombosis and embolism: Secondary | ICD-10-CM | POA: Insufficient documentation

## 2014-01-04 DIAGNOSIS — Z8673 Personal history of transient ischemic attack (TIA), and cerebral infarction without residual deficits: Secondary | ICD-10-CM | POA: Insufficient documentation

## 2014-01-04 DIAGNOSIS — Z8674 Personal history of sudden cardiac arrest: Secondary | ICD-10-CM | POA: Insufficient documentation

## 2014-01-04 LAB — COMPREHENSIVE METABOLIC PANEL
ALT: 9 U/L (ref 0–35)
AST: 19 U/L (ref 0–37)
Albumin: 3.4 g/dL — ABNORMAL LOW (ref 3.5–5.2)
Alkaline Phosphatase: 66 U/L (ref 39–117)
BUN: 31 mg/dL — ABNORMAL HIGH (ref 6–23)
CO2: 19 mEq/L (ref 19–32)
Calcium: 9.4 mg/dL (ref 8.4–10.5)
Chloride: 103 mEq/L (ref 96–112)
Creatinine, Ser: 1.99 mg/dL — ABNORMAL HIGH (ref 0.50–1.10)
GFR calc Af Amer: 26 mL/min — ABNORMAL LOW (ref >90)
GFR calc non Af Amer: 22 mL/min — ABNORMAL LOW (ref >90)
Glucose, Bld: 89 mg/dL (ref 70–99)
Potassium: 4.7 mEq/L (ref 3.7–5.3)
Sodium: 137 mEq/L (ref 137–147)
Total Bilirubin: <0.2 mg/dL — ABNORMAL LOW (ref 0.3–1.2)
Total Protein: 8.2 g/dL (ref 6.0–8.3)

## 2014-01-04 LAB — CBC WITH DIFFERENTIAL/PLATELET
Basophils Absolute: 0 10*3/uL (ref 0.0–0.1)
Basophils Relative: 0 % (ref 0–1)
Eosinophils Absolute: 0.1 10*3/uL (ref 0.0–0.7)
Eosinophils Relative: 1 % (ref 0–5)
HCT: 38.7 % (ref 36.0–46.0)
Hemoglobin: 13 g/dL (ref 12.0–15.0)
Lymphocytes Relative: 46 % (ref 12–46)
Lymphs Abs: 2.8 10*3/uL (ref 0.7–4.0)
MCH: 33.4 pg (ref 26.0–34.0)
MCHC: 33.6 g/dL (ref 30.0–36.0)
MCV: 99.5 fL (ref 78.0–100.0)
Monocytes Absolute: 0.5 10*3/uL (ref 0.1–1.0)
Monocytes Relative: 8 % (ref 3–12)
Neutro Abs: 2.8 10*3/uL (ref 1.7–7.7)
Neutrophils Relative %: 45 % (ref 43–77)
Platelets: 157 10*3/uL (ref 150–400)
RBC: 3.89 MIL/uL (ref 3.87–5.11)
RDW: 15 % (ref 11.5–15.5)
WBC: 6.2 10*3/uL (ref 4.0–10.5)

## 2014-01-04 LAB — URINE MICROSCOPIC-ADD ON

## 2014-01-04 LAB — URINALYSIS, ROUTINE W REFLEX MICROSCOPIC
Bilirubin Urine: NEGATIVE
Glucose, UA: NEGATIVE mg/dL
Hgb urine dipstick: NEGATIVE
Ketones, ur: NEGATIVE mg/dL
Nitrite: POSITIVE — AB
Protein, ur: 30 mg/dL — AB
Specific Gravity, Urine: 1.014 (ref 1.005–1.030)
Urobilinogen, UA: 0.2 mg/dL (ref 0.0–1.0)
pH: 5 (ref 5.0–8.0)

## 2014-01-04 LAB — I-STAT CG4 LACTIC ACID, ED: LACTIC ACID, VENOUS: 0.67 mmol/L (ref 0.5–2.2)

## 2014-01-04 LAB — LIPASE, BLOOD: Lipase: 42 U/L (ref 11–59)

## 2014-01-04 LAB — POC OCCULT BLOOD, ED: Fecal Occult Bld: NEGATIVE

## 2014-01-04 MED ORDER — ONDANSETRON 4 MG PO TBDP: 4.0000 mg | ORAL_TABLET | Freq: Once | ORAL | Status: DC

## 2014-01-04 MED ORDER — ONDANSETRON 4 MG PO TBDP
8.0000 mg | ORAL_TABLET | Freq: Once | ORAL | Status: AC
  Administered 2014-01-04: 8 mg via ORAL
  Filled 2014-01-04: qty 2

## 2014-01-04 MED ORDER — SODIUM CHLORIDE 0.9 % IV BOLUS (SEPSIS)
500.0000 mL | Freq: Once | INTRAVENOUS | Status: AC
  Administered 2014-01-04: 500 mL via INTRAVENOUS

## 2014-01-04 MED ORDER — DEXTROSE 5 % IV SOLN
1.0000 g | Freq: Once | INTRAVENOUS | Status: AC
  Administered 2014-01-04: 1 g via INTRAVENOUS
  Filled 2014-01-04: qty 10

## 2014-01-04 MED ORDER — CEPHALEXIN 500 MG PO CAPS: 500.0000 mg | ORAL_CAPSULE | Freq: Two times a day (BID) | ORAL | Status: DC

## 2014-01-04 MED ORDER — SODIUM CHLORIDE 0.9 % IV BOLUS (SEPSIS)
1000.0000 mL | Freq: Once | INTRAVENOUS | Status: AC
  Administered 2014-01-04: 1000 mL via INTRAVENOUS

## 2014-01-04 NOTE — Discharge Instructions (Signed)
Take your antibiotics as directed and to completion. You should never have any leftover antibiotics! Push fluids and stay well hydrated.   Please follow with your primary care doctor in the next 2 days for a check-up. They must obtain records for further management.   Do not hesitate to return to the Emergency Department for any new, worsening or concerning symptoms.    Dehydration, Elderly Dehydration is when you lose more fluids from the body than you take in. Vital organs such as the kidneys, brain, and heart cannot function without a proper amount of fluids and salt. Any loss of fluids from the body can cause dehydration.  Older adults are at a higher risk of dehydration than younger adults. As we age, our bodies are less able to conserve water and do not respond to temperature changes as well. Also, older adults do not become thirsty as easily or quickly. Because of this, older adults often do not realize they need to increase fluids to avoid dehydration.  CAUSES   Vomiting.  Diarrhea.  Excessive sweating.  Excessive urination.  Fever.  Certain medicines, such as blood pressure medicines called diuretics.  Poorly controlled blood sugars. SIGNS AND SYMPTOMS  Mild dehydration:  Thirst.  Dry lips.  Slightly dry mouth. Moderate dehydration:  Very dry mouth.  Sunken eyes.  Skin does not bounce back quickly when lightly pinched and released.  Dark urine and decreased urine production.  Decreased tear production.  Headache. Severe dehydration:  Very dry mouth.  Extreme thirst.  Rapid, weak pulse (more than 100 beats per minute at rest).  Cold hands and feet.  Not able to sweat in spite of heat.  Rapid breathing.  Blue lips.  Confusion and lethargy.  Difficulty being awakened.  Minimal urine production.  No tears. DIAGNOSIS  Your health care provider will diagnose dehydration based on your symptoms and your exam. Blood and urine tests will help  confirm the diagnosis. The diagnostic evaluation should also identify the cause of dehydration. TREATMENT  Treatment of mild or moderate dehydration can often be done at home by increasing the amount of fluids that you drink. It is best to drink small amounts of fluid more often. Drinking too much at one time can make vomiting worse. Severe dehydration needs to be treated at the hospital. You may be given IV fluids that contain water and electrolytes. HOME CARE INSTRUCTIONS   Ask your health care provider about specific rehydration instructions.  Drink enough fluids to keep your urine clear or pale yellow.  Drink small amounts frequently if you have nausea and vomiting.  Eat as you normally do.  Avoid:  Foods or drinks high in sugar.  Carbonated drinks.  Juice.  Extremely hot or cold fluids.  Drinks with caffeine.  Fatty, greasy foods.  Alcohol.  Tobacco.  Overeating.  Gelatin desserts.  Wash your hands well to avoid spreading bacteria and viruses.  Only take over-the-counter or prescription medicines for pain, discomfort, or fever as directed by your health care provider.  Ask your health care provider if you should continue all prescribed and over-the-counter medicines.  Keep all follow-up appointments with your health care provider. SEEK MEDICAL CARE IF:  You have abdominal pain, and it increases or stays in one area (localizes).  You have a rash, stiff neck, or severe headache.  You are irritable, sleepy, or difficult to awaken.  You are weak, dizzy, or extremely thirsty. SEEK IMMEDIATE MEDICAL CARE IF:   You are unable to keep fluids  down, or you get worse despite treatment.  You have frequent episodes of vomiting or diarrhea.  You have blood or green matter (bile) in your vomit.  You have blood in your stool, or your stool looks black and tarry.  You have not urinated in 6 8 hours, or you have only urinated a small amount of very dark urine.  You  have a fever.  You faint. MAKE SURE YOU:   Understand these instructions.  Will watch your condition.  Will get help right away if you are not doing well or get worse. Document Released: 10/23/2003 Document Revised: 05/23/2013 Document Reviewed: 04/09/2013 [facility] Patient Information 2014 [place].

## 2014-01-04 NOTE — ED Notes (Signed)
EDPA at bedside, pt's son at bedside, pt in NAD

## 2014-01-04 NOTE — ED Notes (Signed)
CG-4 reported to Dr. Wilson Singer

## 2014-01-04 NOTE — ED Notes (Addendum)
Unable to obtain stool sample, EDPA made aware. States OK for d/c after ABX complete.

## 2014-01-04 NOTE — ED Notes (Signed)
Pt and pt's son comfortable with discharge and follow up instructions. Prescriptions x1.

## 2014-01-04 NOTE — ED Notes (Signed)
States has had diarrhea for three days, now is weak, with no appetite. States has had 2 liquid bowel movements today-- pt is visually impaired-- son is with her.

## 2014-01-04 NOTE — ED Provider Notes (Signed)
CSN: [id number]     Arrival date & time 01/04/14  1321 History   First MD Initiated Contact with Patient 01/04/14 1526     Chief Complaint  Patient presents with  . Diarrhea  . Weakness     (Consider location/radiation/quality/duration/timing/severity/associated sxs/prior Treatment) HPI  [name] is a 80 y.o. female  with past medical history significant for CVA, CAD, CKD stage III, dementia, DVT and impaired vision, accompanied by her son complaining of 3 days of diarrhea. Patient states that she also has subjective fever and chills. Patient states that she has approximately 3 bowel movements today she describes them as watery. She is unable to say whether they were melanotic or bloody she is visually impaired. Patient denies abdominal pain, endorses a reduced by mouth intake secondary to nausea with no vomiting. Denies chest pain, shortness of breath, change in urination, recent antibiotic use   Past Medical History  Diagnosis Date  . Arthritis   . Hypertension   . History of gout   . Glaucoma   . Cataracts, bilateral   . Hyperlipidemia   . Gallstones   . Cerebrovascular disease   . Hyponatremia     a. H/o 2011 ?SIADH with seizures, encephalopathy at that time.  . Temporal arteritis   . Cardiac arrest 05/2012    a.  x 2, thought due to renal failure and hypoK;  b.  Echo 10/13: EF 25-36%, grade 1 diastolic dysfunction, dilated RV (RV function appears depressed), mild to moderate TR, PASP 35;   c.  Myoview performed but inconclusive  . Dementia 06/19/2012  . Coronary artery disease     a. Neg stress test 2008. b. ?questionable history - pt thought she had heart cath in 1970's but details were unclear.  . Acute kidney injury   . CKD (chronic kidney disease), stage III   . Seizures     a. Due to hyponatremia, resolved.  . Stroke     a. Details unclear.  Marland Kitchen DVT (deep venous thrombosis)    Past Surgical History  Procedure Laterality Date  . Eye surgery    . Abdominal  hysterectomy    . Coronary angioplasty with stent placement     Family History  Problem Relation Age of Onset  . Hypertension    . Diabetes type II    . Hypertension Mother   . Hypertension Father    History  Substance Use Topics  . Smoking status: Never Smoker   . Smokeless tobacco: Never Used  . Alcohol Use: No     Comment: used to drink beer heavily per old records.    OB History   Grav Para Term Preterm Abortions TAB SAB Ect Mult Living                 Review of Systems  10 systems reviewed and found to be negative, except as noted in the HPI.  Allergies  Lactose intolerance (gi)  Home Medications   Prior to Admission medications   Medication Sig Start Date End Date Taking? Authorizing Provider  acetaminophen (TYLENOL) 325 MG tablet Take 325 mg by mouth daily as needed for pain. For pain   Yes Historical Provider, MD  allopurinol (ZYLOPRIM) 100 MG tablet Take 100 mg by mouth daily.   Yes Historical Provider, MD  bisoprolol (ZEBETA) 5 MG tablet Take 1 tablet (5 mg total) by mouth daily. 07/07/12  Yes Vivi Ferns, MD  escitalopram (LEXAPRO) 5 MG tablet Take 1 tablet (5 mg  total) by mouth daily. 08/19/12  Yes Ripudeep Krystal Eaton, MD  ezetimibe (ZETIA) 10 MG tablet Take 1 tablet (10 mg total) by mouth daily. 07/07/12  Yes Vivi Ferns, MD  feeding supplement (ENSURE COMPLETE) LIQD Take 237 mLs by mouth 2 (two) times daily between meals. Also available OTC 08/19/12  Yes Ripudeep K Rai, MD  levETIRAcetam (KEPPRA) 250 MG tablet Take 250 mg by mouth 2 (two) times daily.  07/07/12  Yes Vivi Ferns, MD  pantoprazole (PROTONIX) 40 MG tablet Take 40 mg by mouth daily.   Yes Historical Provider, MD  Probiotic Product (PROBIOTIC DAILY PO) Take 1 tablet by mouth daily. Women's probiotic & digestive aid from [facility]   Yes Historical Provider, MD   BP 108/78  Pulse 65  Temp(Src) 97.6 F (36.4 C) (Oral)  Resp 16  Ht 5\' 1"  (1.549 m)  Wt 127 lb 14.4 oz (58.015 kg)  BMI 24.18 kg/m2  SpO2  95% Physical Exam  Nursing note and vitals reviewed. Constitutional: She is oriented to person, place, and time. She appears well-developed and well-nourished. No distress.  HENT:  Head: Normocephalic and atraumatic.  Mouth/Throat: Oropharynx is clear and moist.  Eyes: Conjunctivae and EOM are normal. Pupils are equal, round, and reactive to light.  Neck: Normal range of motion.  Cardiovascular: Normal rate, regular rhythm and intact distal pulses.   Pulmonary/Chest: Effort normal and breath sounds normal. No stridor. No respiratory distress. She has no wheezes. She has no rales. She exhibits no tenderness.  Abdominal: Soft. Bowel sounds are normal. She exhibits no distension and no mass. There is no tenderness. There is no rebound and no guarding.  Musculoskeletal: Normal range of motion.  Neurological: She is alert and oriented to person, place, and time.  Skin: Skin is warm.  Psychiatric: She has a normal mood and affect.    ED Course  Procedures (including critical care time) Labs Review Labs Reviewed  COMPREHENSIVE METABOLIC PANEL - Abnormal; Notable for the following:    BUN 31 (*)    Creatinine, Ser 1.99 (*)    Albumin 3.4 (*)    Total Bilirubin <0.2 (*)    GFR calc non Af Amer 22 (*)    GFR calc Af Amer 26 (*)    All other components within normal limits  URINALYSIS, ROUTINE W REFLEX MICROSCOPIC - Abnormal; Notable for the following:    APPearance CLOUDY (*)    Protein, ur 30 (*)    Nitrite POSITIVE (*)    Leukocytes, UA MODERATE (*)    All other components within normal limits  URINE MICROSCOPIC-ADD ON - Abnormal; Notable for the following:    Bacteria, UA MANY (*)    Casts HYALINE CASTS (*)    All other components within normal limits  URINE CULTURE  LIPASE, BLOOD  CBC WITH DIFFERENTIAL  GI PATHOGEN PANEL BY PCR, STOOL  I-STAT CG4 LACTIC ACID, ED  POC OCCULT BLOOD, ED    Imaging Review No results found.   EKG Interpretation None      MDM   Final  diagnoses:  UTI (lower urinary tract infection)  AKI (acute kidney injury)  Dehydration    Filed Vitals:   01/04/14 1346  BP: 108/78  Pulse: 65  Temp: 97.6 F (36.4 C)  TempSrc: Oral  Resp: 16  Height: 5\' 1"  (1.549 m)  Weight: 127 lb 14.4 oz (58.015 kg)  SpO2: 95%    Medications  cefTRIAXone (ROCEPHIN) 1 g in dextrose 5 % 50 mL  IVPB (not administered)  ondansetron (ZOFRAN-ODT) disintegrating tablet 8 mg (8 mg Oral Given 01/04/14 1546)  sodium chloride 0.9 % bolus 500 mL (0 mLs Intravenous Stopped 01/04/14 1707)  sodium chloride 0.9 % bolus 1,000 mL (1,000 mLs Intravenous New Bag/Given 01/04/14 1706)    [name] is a 80 y.o. female presenting with diarrhea first course of 3 days. Patient does not appear clinically dehydrated. Patient does have a mild bump in creatinine above her baseline. Be hydrated in the ED, urinalysis is consistent with the fact infection, we'll give her a gram of Rocephin IV and sent her home on Keflex. Patient was unable to produce a stool sample to send for analysis in the ED. Patient became a specimen container and asked to return to the ED with specimen becomes available. Extensive discussion of return precautions. Tissue tolerating by mouth, son and patient agree with discharge plan.  Chart review shows patient has chronic kidney disease stage III. GFR today is 26 corrected for race. Patient has acute on chronic kidney disease. We'll hydrate slowly secondary to cardiac issues. Electrolytes otherwise reassuring.  Discussed case with attending MD who agrees with plan and stability to d/c to home.   Evaluation does not show pathology that would require ongoing emergent intervention or inpatient treatment. Pt is hemodynamically stable and mentating appropriately. Discussed findings and plan with patient/guardian, who agrees with care plan. All questions answered. Return precautions discussed and outpatient follow up given.   New Prescriptions   CEPHALEXIN  (KEFLEX) 500 MG CAPSULE    Take 1 capsule (500 mg total) by mouth 2 (two) times daily.    Note: Portions of this report may have been transcribed using voice recognition software. Every effort was made to ensure accuracy; however, inadvertent computerized transcription errors may be present     Monico Blitz, PA-C 01/04/14 [place], PA-C 01/04/14 1950

## 2014-01-05 ENCOUNTER — Ambulatory Visit (HOSPITAL_COMMUNITY)
Admission: RE | Admit: 2014-01-05 | Discharge: 2014-01-05 | Disposition: A | Discharge status: Home / Self Care | Payer: Medicare Other | Attending: Emergency Medicine | Admitting: Emergency Medicine

## 2014-01-05 DIAGNOSIS — R5381 Other malaise: Secondary | ICD-10-CM | POA: Insufficient documentation

## 2014-01-05 DIAGNOSIS — R5383 Other fatigue: Secondary | ICD-10-CM

## 2014-01-05 DIAGNOSIS — R197 Diarrhea, unspecified: Secondary | ICD-10-CM | POA: Insufficient documentation

## 2014-01-05 NOTE — ED Provider Notes (Signed)
Medical screening examination/treatment/procedure(s) were performed by non-physician practitioner and as supervising physician I was immediately available for consultation/collaboration.   EKG Interpretation None       Nicholos Aloisi, MD 01/05/14 1549 

## 2014-01-05 NOTE — ED Notes (Signed)
The pt son returned with a stool sample on 5/23. He states he was instructed by ED staff at discharge to collect stool at home and bring it back. He was given a specimen cup to bring the stool back in. Spoke with dr Wilson Singer and he placed an order for the stool today. Sample taken from pt to laboratory

## 2014-01-05 NOTE — ED Provider Notes (Signed)
Pt's son brought back stool sample to ED. Pt seen yesterday. Stool studies ordered.   Virgel Manifold, MD 01/05/14 [phone]

## 2014-01-07 LAB — URINE CULTURE: Colony Count: >=100000

## 2014-01-08 ENCOUNTER — Telehealth (HOSPITAL_BASED_OUTPATIENT_CLINIC_OR_DEPARTMENT_OTHER): Payer: Self-pay | Admitting: Emergency Medicine

## 2014-01-08 LAB — OVA AND PARASITE EXAMINATION

## 2014-01-08 NOTE — Telephone Encounter (Signed)
Post ED Visit - Positive Culture Follow-up  Culture report reviewed by antimicrobial stewardship pharmacist: []  Wes Willows, Pharm.D., BCPS []  Heide Guile, Pharm.D., BCPS []  Alycia Rossetti, Pharm.D., BCPS []  [place], Pharm.D., BCPS, AAHIVP []  Legrand Como, Pharm.D., BCPS, AAHIVP []  Juliene Pina, Pharm.D. [x]  Assunta Curtis, Pharm.D.  Positive urine culture Treated with Keflex, organism sensitive to the same and no further patient follow-up is required at this time.  Saveon Plant 01/08/2014, 1:11 PM

## 2014-01-09 LAB — STOOL CULTURE

## 2014-02-03 IMAGING — CR DG SHOULDER 2+V*L*
3 series · 3 of 3 positions shown · non-contrast
Comparison: None.

CLINICAL DATA: Left shoulder pain

EXAM:
LEFT SHOULDER - 2+ VIEW

[view not recorded (1 of 3)]
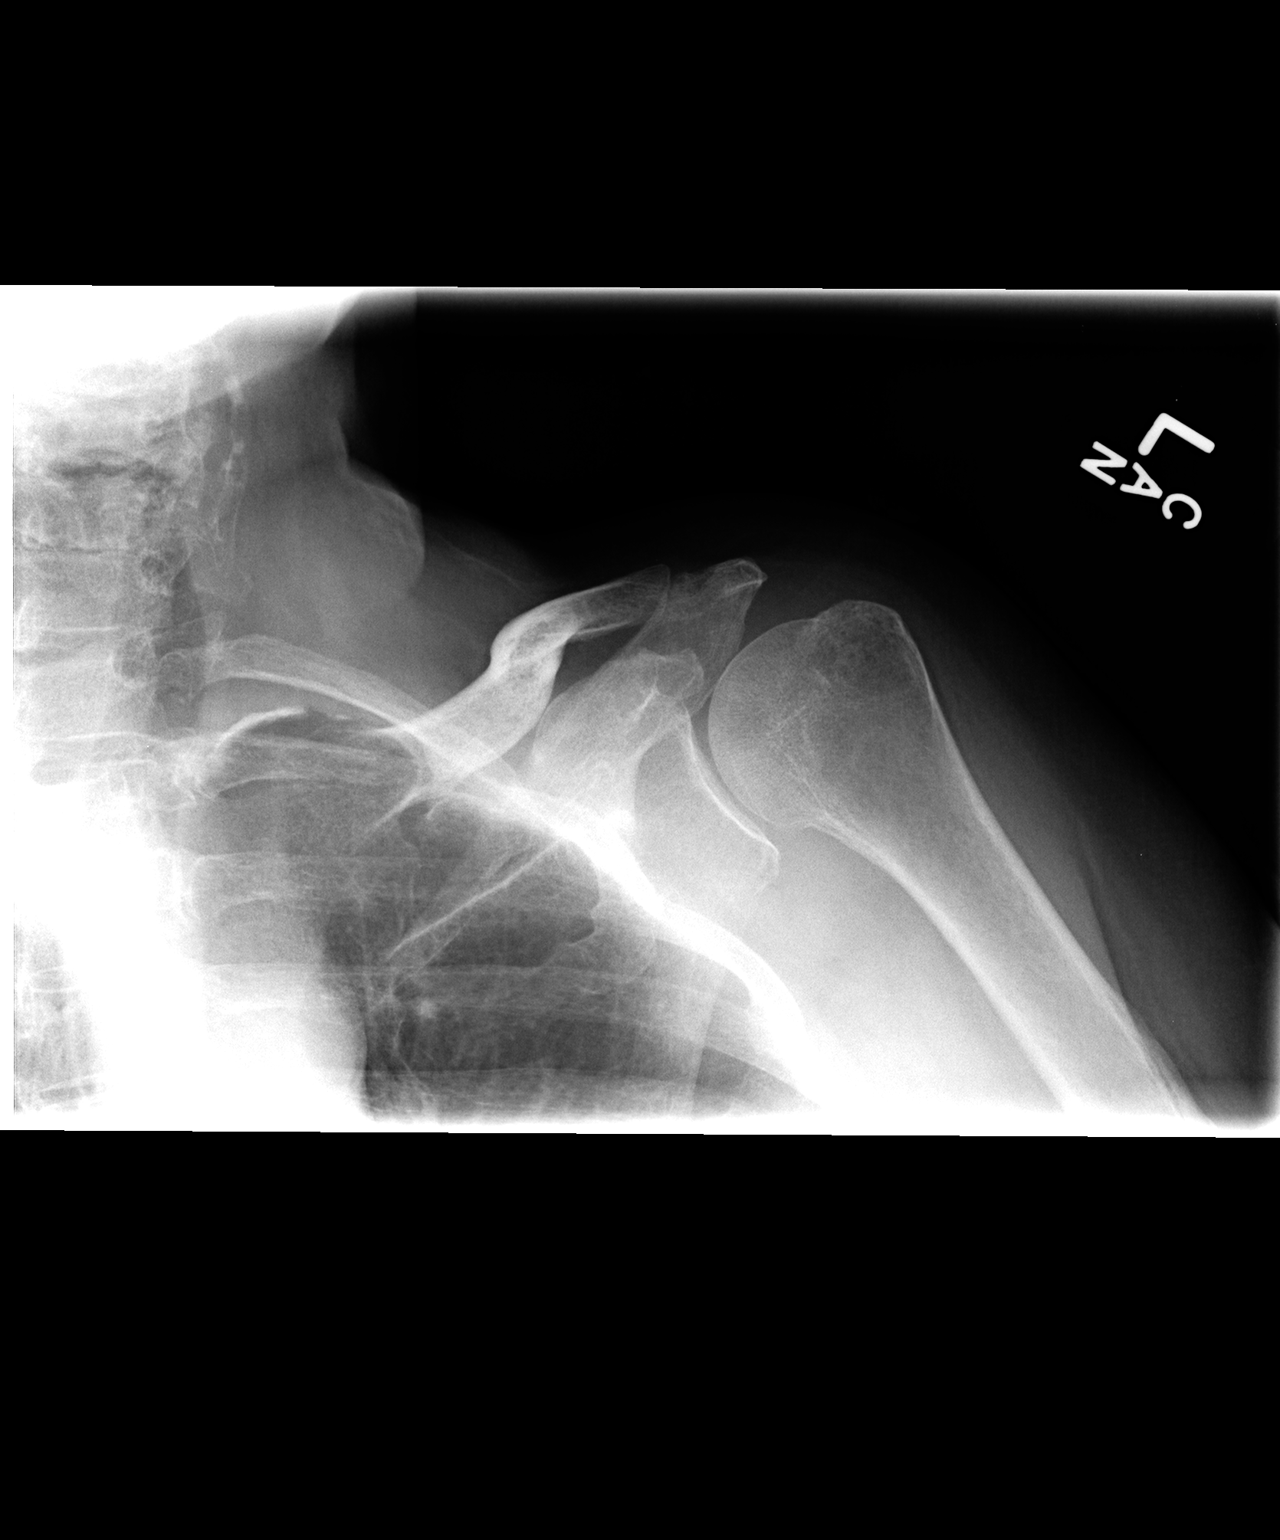

[view not recorded (2 of 3)]
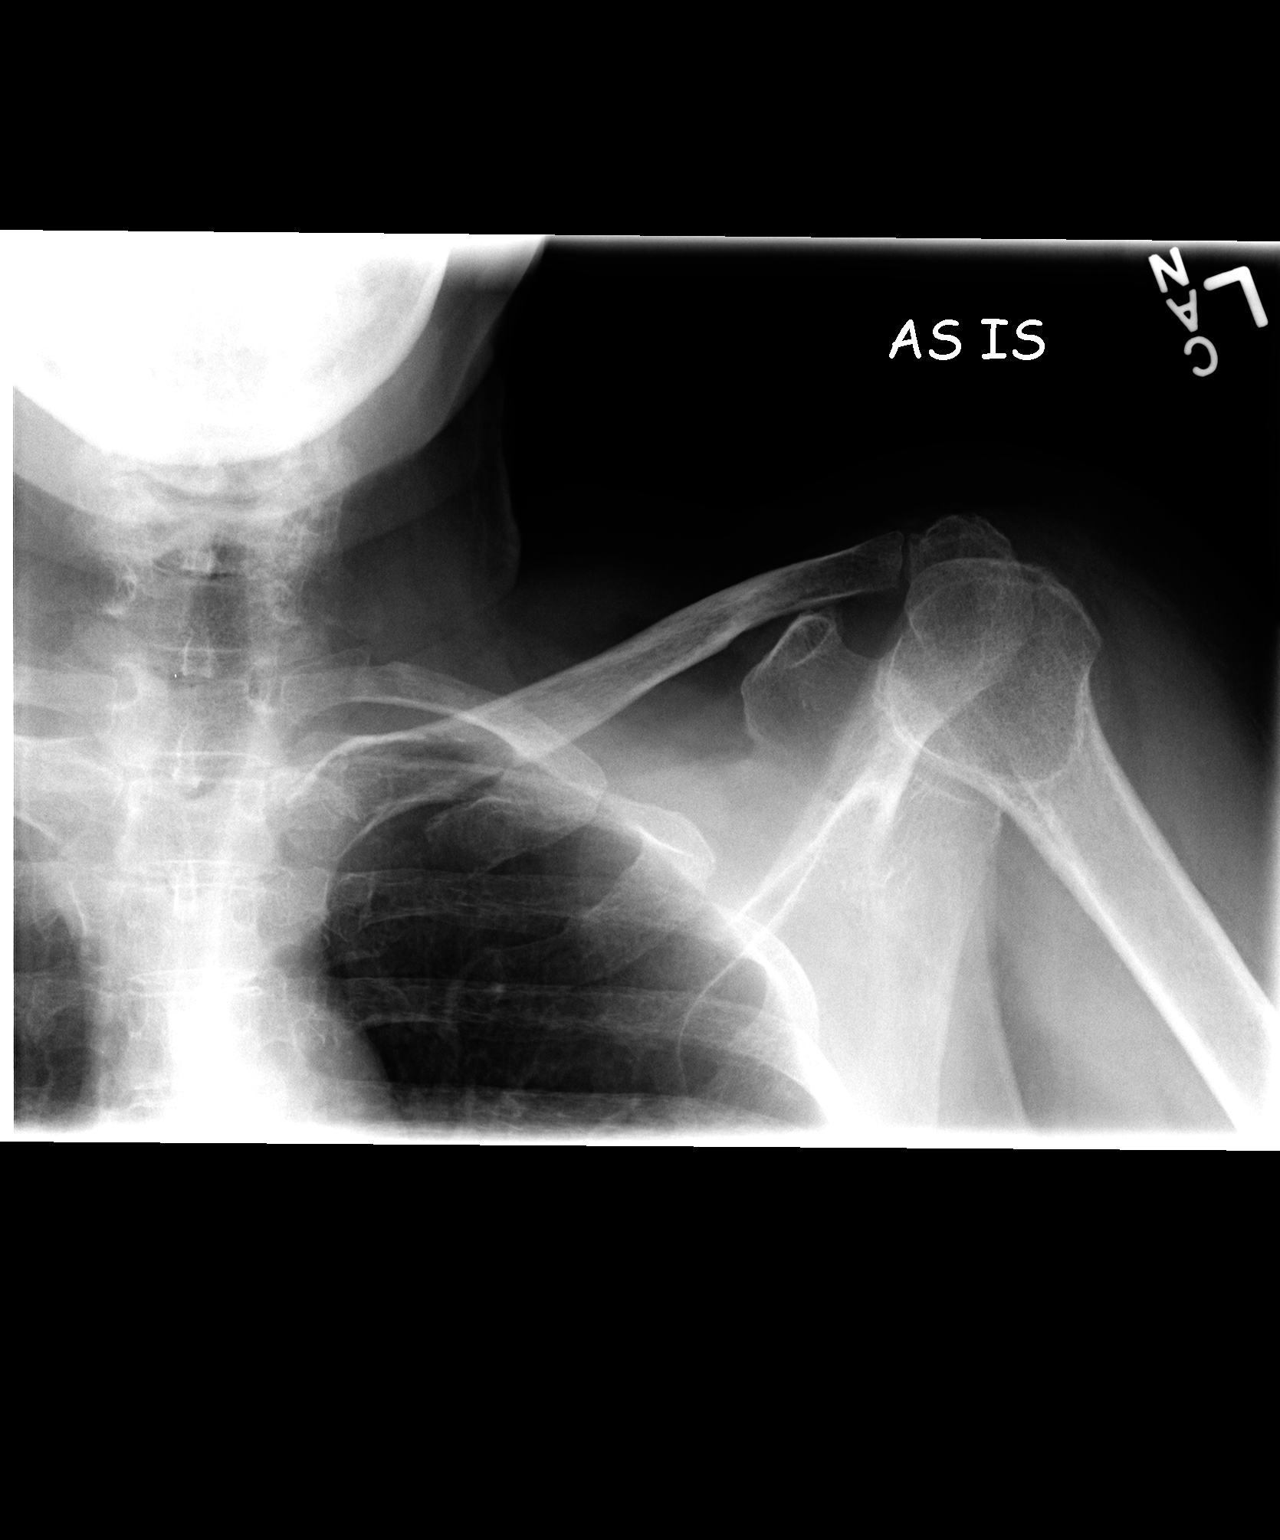

[view not recorded (3 of 3)]
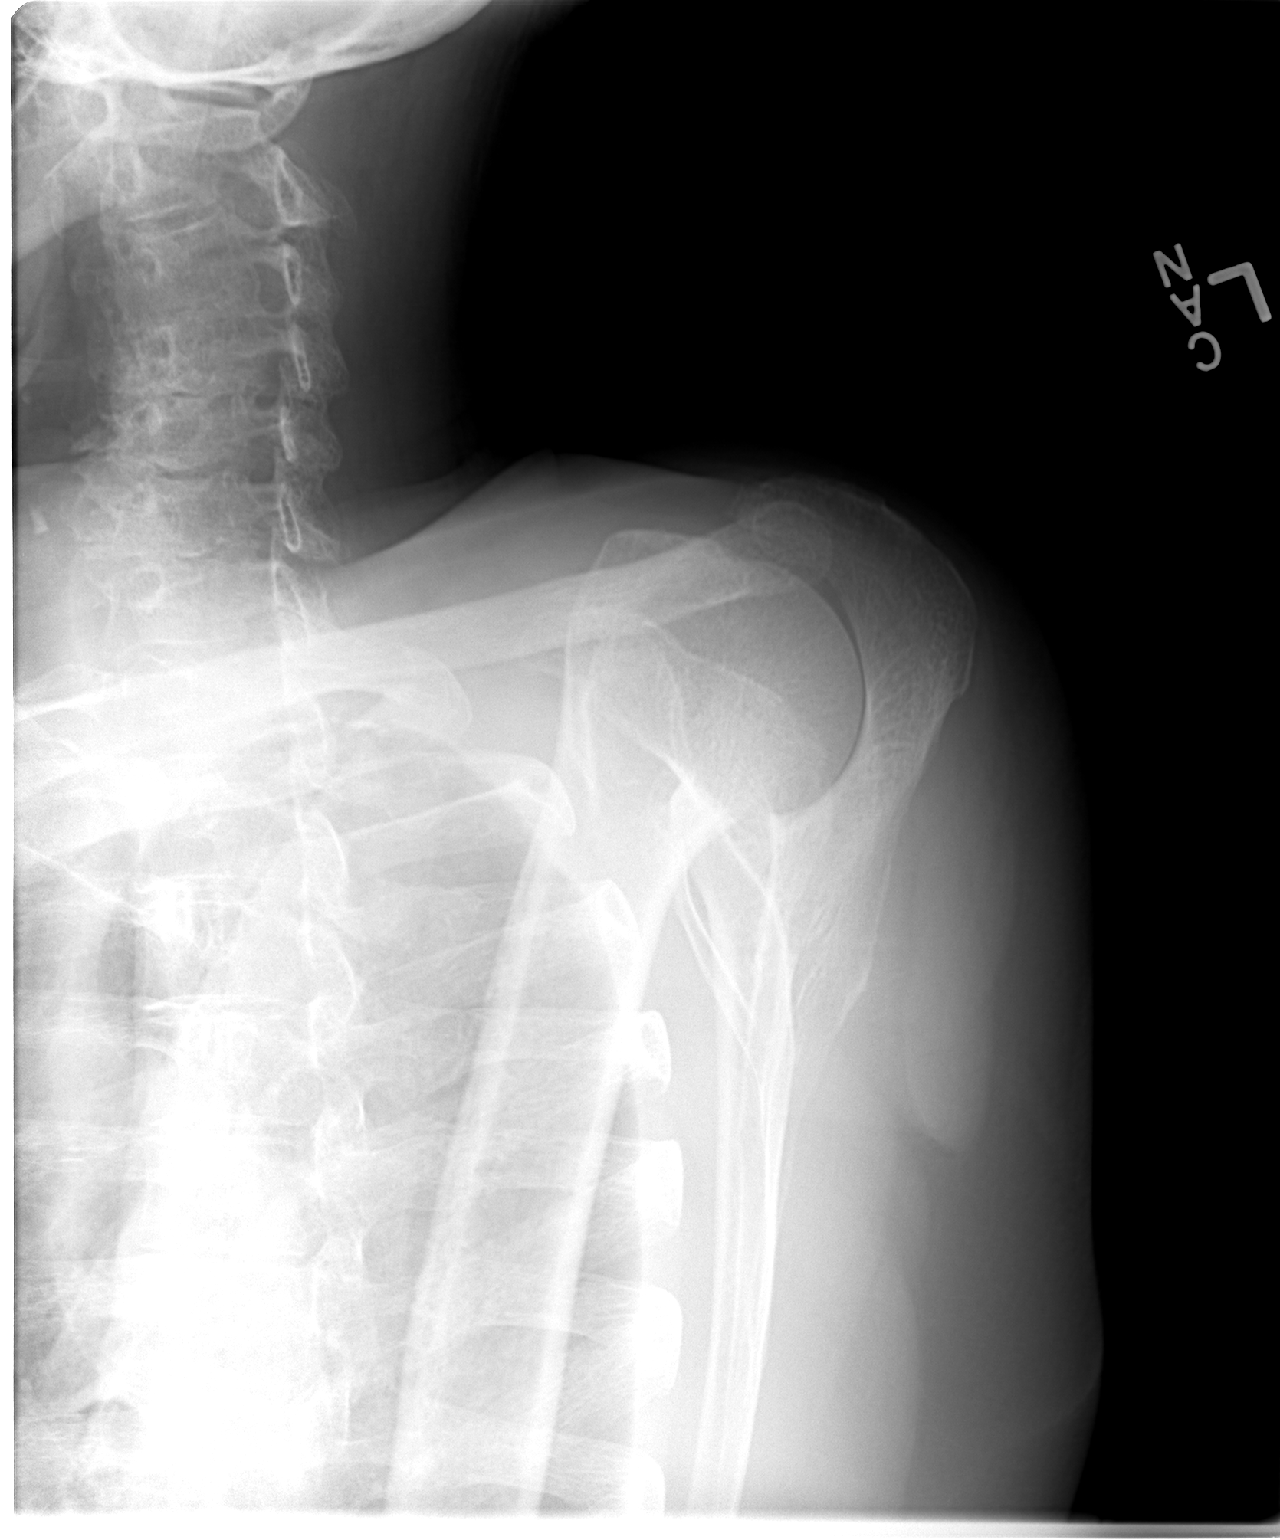

[3 of 3 positions shown; findings below may reference images not displayed]

FINDINGS: There is no evidence of fracture or dislocation. There is no
evidence of arthropathy or other focal bone abnormality. Soft
tissues are unremarkable.
IMPRESSION: No acute abnormality noted.

## 2014-02-15 ENCOUNTER — Emergency Department (HOSPITAL_COMMUNITY): Payer: Medicare Other

## 2014-02-15 ENCOUNTER — Inpatient Hospital Stay (HOSPITAL_COMMUNITY): Payer: Medicare Other

## 2014-02-15 ENCOUNTER — Inpatient Hospital Stay (HOSPITAL_COMMUNITY)
Admission: EM | Admit: 2014-02-15 | Discharge: 2014-03-16 | DRG: 064 | Disposition: E | Payer: Medicare Other | Attending: Internal Medicine | Admitting: Internal Medicine

## 2014-02-15 ENCOUNTER — Encounter (HOSPITAL_COMMUNITY): Payer: Self-pay | Admitting: Radiology

## 2014-02-15 ENCOUNTER — Other Ambulatory Visit (HOSPITAL_COMMUNITY): Payer: Medicare Other

## 2014-02-15 DIAGNOSIS — R4020 Unspecified coma: Secondary | ICD-10-CM | POA: Diagnosis present

## 2014-02-15 DIAGNOSIS — N183 Chronic kidney disease, stage 3 unspecified: Secondary | ICD-10-CM | POA: Diagnosis present

## 2014-02-15 DIAGNOSIS — I6350 Cerebral infarction due to unspecified occlusion or stenosis of unspecified cerebral artery: Principal | ICD-10-CM | POA: Diagnosis present

## 2014-02-15 DIAGNOSIS — Z86718 Personal history of other venous thrombosis and embolism: Secondary | ICD-10-CM | POA: Diagnosis not present

## 2014-02-15 DIAGNOSIS — R4182 Altered mental status, unspecified: Secondary | ICD-10-CM | POA: Diagnosis present

## 2014-02-15 DIAGNOSIS — N179 Acute kidney failure, unspecified: Secondary | ICD-10-CM

## 2014-02-15 DIAGNOSIS — Z9861 Coronary angioplasty status: Secondary | ICD-10-CM | POA: Diagnosis not present

## 2014-02-15 DIAGNOSIS — R0902 Hypoxemia: Secondary | ICD-10-CM | POA: Diagnosis present

## 2014-02-15 DIAGNOSIS — M109 Gout, unspecified: Secondary | ICD-10-CM | POA: Diagnosis present

## 2014-02-15 DIAGNOSIS — Z8669 Personal history of other diseases of the nervous system and sense organs: Secondary | ICD-10-CM

## 2014-02-15 DIAGNOSIS — I2510 Atherosclerotic heart disease of native coronary artery without angina pectoris: Secondary | ICD-10-CM | POA: Diagnosis present

## 2014-02-15 DIAGNOSIS — K219 Gastro-esophageal reflux disease without esophagitis: Secondary | ICD-10-CM | POA: Diagnosis present

## 2014-02-15 DIAGNOSIS — H269 Unspecified cataract: Secondary | ICD-10-CM | POA: Diagnosis present

## 2014-02-15 DIAGNOSIS — Z515 Encounter for palliative care: Secondary | ICD-10-CM | POA: Diagnosis not present

## 2014-02-15 DIAGNOSIS — R34 Anuria and oliguria: Secondary | ICD-10-CM | POA: Diagnosis present

## 2014-02-15 DIAGNOSIS — R4189 Other symptoms and signs involving cognitive functions and awareness: Secondary | ICD-10-CM

## 2014-02-15 DIAGNOSIS — I129 Hypertensive chronic kidney disease with stage 1 through stage 4 chronic kidney disease, or unspecified chronic kidney disease: Secondary | ICD-10-CM | POA: Diagnosis present

## 2014-02-15 DIAGNOSIS — N189 Chronic kidney disease, unspecified: Secondary | ICD-10-CM

## 2014-02-15 DIAGNOSIS — I959 Hypotension, unspecified: Secondary | ICD-10-CM | POA: Diagnosis present

## 2014-02-15 DIAGNOSIS — Z87898 Personal history of other specified conditions: Secondary | ICD-10-CM

## 2014-02-15 DIAGNOSIS — D472 Monoclonal gammopathy: Secondary | ICD-10-CM | POA: Diagnosis present

## 2014-02-15 DIAGNOSIS — Z79899 Other long term (current) drug therapy: Secondary | ICD-10-CM | POA: Diagnosis not present

## 2014-02-15 DIAGNOSIS — A498 Other bacterial infections of unspecified site: Secondary | ICD-10-CM | POA: Diagnosis present

## 2014-02-15 DIAGNOSIS — I10 Essential (primary) hypertension: Secondary | ICD-10-CM | POA: Diagnosis present

## 2014-02-15 DIAGNOSIS — Z8739 Personal history of other diseases of the musculoskeletal system and connective tissue: Secondary | ICD-10-CM | POA: Diagnosis present

## 2014-02-15 DIAGNOSIS — Z66 Do not resuscitate: Secondary | ICD-10-CM | POA: Diagnosis present

## 2014-02-15 DIAGNOSIS — F0390 Unspecified dementia without behavioral disturbance: Secondary | ICD-10-CM | POA: Diagnosis present

## 2014-02-15 DIAGNOSIS — I6322 Cerebral infarction due to unspecified occlusion or stenosis of basilar arteries: Secondary | ICD-10-CM

## 2014-02-15 DIAGNOSIS — I519 Heart disease, unspecified: Secondary | ICD-10-CM

## 2014-02-15 DIAGNOSIS — H409 Unspecified glaucoma: Secondary | ICD-10-CM | POA: Diagnosis present

## 2014-02-15 DIAGNOSIS — N390 Urinary tract infection, site not specified: Secondary | ICD-10-CM | POA: Diagnosis present

## 2014-02-15 DIAGNOSIS — Z8674 Personal history of sudden cardiac arrest: Secondary | ICD-10-CM | POA: Diagnosis not present

## 2014-02-15 DIAGNOSIS — G40909 Epilepsy, unspecified, not intractable, without status epilepticus: Secondary | ICD-10-CM | POA: Diagnosis present

## 2014-02-15 DIAGNOSIS — E785 Hyperlipidemia, unspecified: Secondary | ICD-10-CM | POA: Diagnosis present

## 2014-02-15 DIAGNOSIS — Z8673 Personal history of transient ischemic attack (TIA), and cerebral infarction without residual deficits: Secondary | ICD-10-CM | POA: Diagnosis not present

## 2014-02-15 DIAGNOSIS — I639 Cerebral infarction, unspecified: Secondary | ICD-10-CM | POA: Diagnosis present

## 2014-02-15 LAB — CBC
HEMATOCRIT: 38.7 % (ref 36.0–46.0)
HEMOGLOBIN: 13.2 g/dL (ref 12.0–15.0)
MCH: 33 pg (ref 26.0–34.0)
MCHC: 34.1 g/dL (ref 30.0–36.0)
MCV: 96.8 fL (ref 78.0–100.0)
Platelets: 168 10*3/uL (ref 150–400)
RBC: 4 MIL/uL (ref 3.87–5.11)
RDW: 13.8 % (ref 11.5–15.5)
WBC: 12.4 10*3/uL — ABNORMAL HIGH (ref 4.0–10.5)

## 2014-02-15 LAB — CBC WITH DIFFERENTIAL/PLATELET
BASOS ABS: 0 10*3/uL (ref 0.0–0.1)
BASOS PCT: 0 % (ref 0–1)
EOS PCT: 0 % (ref 0–5)
Eosinophils Absolute: 0 10*3/uL (ref 0.0–0.7)
HCT: 38.4 % (ref 36.0–46.0)
Hemoglobin: 13.3 g/dL (ref 12.0–15.0)
LYMPHS PCT: 21 % (ref 12–46)
Lymphs Abs: 1.8 10*3/uL (ref 0.7–4.0)
MCH: 33.2 pg (ref 26.0–34.0)
MCHC: 34.6 g/dL (ref 30.0–36.0)
MCV: 95.8 fL (ref 78.0–100.0)
Monocytes Absolute: 0.4 10*3/uL (ref 0.1–1.0)
Monocytes Relative: 4 % (ref 3–12)
Neutro Abs: 6.5 10*3/uL (ref 1.7–7.7)
Neutrophils Relative %: 75 % (ref 43–77)
Platelets: 168 10*3/uL (ref 150–400)
RBC: 4.01 MIL/uL (ref 3.87–5.11)
RDW: 13.9 % (ref 11.5–15.5)
WBC: 8.7 10*3/uL (ref 4.0–10.5)

## 2014-02-15 LAB — COMPREHENSIVE METABOLIC PANEL
ALBUMIN: 3.4 g/dL — AB (ref 3.5–5.2)
ALT: 11 U/L (ref 0–35)
AST: 19 U/L (ref 0–37)
Alkaline Phosphatase: 64 U/L (ref 39–117)
Anion gap: 20 — ABNORMAL HIGH (ref 5–15)
BUN: 21 mg/dL (ref 6–23)
CALCIUM: 9.6 mg/dL (ref 8.4–10.5)
CO2: 19 meq/L (ref 19–32)
Chloride: 97 mEq/L (ref 96–112)
Creatinine, Ser: 1.46 mg/dL — ABNORMAL HIGH (ref 0.50–1.10)
GFR calc Af Amer: 38 mL/min — ABNORMAL LOW (ref >90)
GFR, EST NON AFRICAN AMERICAN: 33 mL/min — AB (ref >90)
Glucose, Bld: 183 mg/dL — ABNORMAL HIGH (ref 70–99)
Potassium: 4.8 mEq/L (ref 3.7–5.3)
SODIUM: 136 meq/L — AB (ref 137–147)
TOTAL PROTEIN: 7.9 g/dL (ref 6.0–8.3)
Total Bilirubin: <0.2 mg/dL — ABNORMAL LOW (ref 0.3–1.2)

## 2014-02-15 LAB — RAPID URINE DRUG SCREEN, HOSP PERFORMED
Amphetamines: NOT DETECTED
Barbiturates: NOT DETECTED
Benzodiazepines: NOT DETECTED
COCAINE: NOT DETECTED
OPIATES: NOT DETECTED
Tetrahydrocannabinol: NOT DETECTED

## 2014-02-15 LAB — URINE MICROSCOPIC-ADD ON

## 2014-02-15 LAB — I-STAT CG4 LACTIC ACID, ED: LACTIC ACID, VENOUS: 1.91 mmol/L (ref 0.5–2.2)

## 2014-02-15 LAB — GLUCOSE, CAPILLARY
GLUCOSE-CAPILLARY: 169 mg/dL — AB (ref 70–99)
Glucose-Capillary: 137 mg/dL — ABNORMAL HIGH (ref 70–99)

## 2014-02-15 LAB — I-STAT CHEM 8, ED
BUN: 21 mg/dL (ref 6–23)
CHLORIDE: 103 meq/L (ref 96–112)
Calcium, Ion: 1.23 mmol/L (ref 1.13–1.30)
Creatinine, Ser: 1.6 mg/dL — ABNORMAL HIGH (ref 0.50–1.10)
GLUCOSE: 187 mg/dL — AB (ref 70–99)
HEMATOCRIT: 46 % (ref 36.0–46.0)
HEMOGLOBIN: 15.6 g/dL — AB (ref 12.0–15.0)
POTASSIUM: 4.7 meq/L (ref 3.7–5.3)
SODIUM: 139 meq/L (ref 137–147)
TCO2: 20 mmol/L (ref 0–100)

## 2014-02-15 LAB — URINALYSIS, ROUTINE W REFLEX MICROSCOPIC
BILIRUBIN URINE: NEGATIVE
Glucose, UA: NEGATIVE mg/dL
HGB URINE DIPSTICK: NEGATIVE
Ketones, ur: NEGATIVE mg/dL
Leukocytes, UA: NEGATIVE
Nitrite: NEGATIVE
PH: 6 (ref 5.0–8.0)
Protein, ur: 100 mg/dL — AB
SPECIFIC GRAVITY, URINE: 1.013 (ref 1.005–1.030)
Urobilinogen, UA: 0.2 mg/dL (ref 0.0–1.0)

## 2014-02-15 LAB — I-STAT TROPONIN, ED: Troponin i, poc: 0 ng/mL (ref 0.00–0.08)

## 2014-02-15 LAB — I-STAT VENOUS BLOOD GAS, ED
Acid-base deficit: 4 mmol/L — ABNORMAL HIGH (ref 0.0–2.0)
BICARBONATE: 21.1 meq/L (ref 20.0–24.0)
O2 SAT: 86 %
PO2 VEN: 53 mmHg — AB (ref 30.0–45.0)
TCO2: 22 mmol/L (ref 0–100)
pCO2, Ven: 36.2 mmHg — ABNORMAL LOW (ref 45.0–50.0)
pH, Ven: 7.373 — ABNORMAL HIGH (ref 7.250–7.300)

## 2014-02-15 LAB — TYPE AND SCREEN
ABO/RH(D): A NEG
Antibody Screen: NEGATIVE

## 2014-02-15 LAB — CREATININE, SERUM
CREATININE: 1.37 mg/dL — AB (ref 0.50–1.10)
GFR calc Af Amer: 41 mL/min — ABNORMAL LOW (ref >90)
GFR calc non Af Amer: 35 mL/min — ABNORMAL LOW (ref >90)

## 2014-02-15 LAB — PROLACTIN: Prolactin: 8.9 ng/mL

## 2014-02-15 LAB — MRSA PCR SCREENING: MRSA by PCR: NEGATIVE

## 2014-02-15 LAB — CK: Total CK: 106 U/L (ref 7–177)

## 2014-02-15 LAB — ETHANOL: Alcohol, Ethyl (B): <11 mg/dL (ref 0–11)

## 2014-02-15 MED ORDER — ASPIRIN 300 MG RE SUPP
300.0000 mg | Freq: Every day | RECTAL | Status: DC
  Administered 2014-02-15: 300 mg via RECTAL
  Filled 2014-02-15 (×2): qty 1

## 2014-02-15 MED ORDER — METOPROLOL TARTRATE 1 MG/ML IV SOLN: 2.5000 mg | Freq: Four times a day (QID) | INTRAVENOUS | Status: DC | PRN

## 2014-02-15 MED ORDER — ACETAMINOPHEN 650 MG RE SUPP
650.0000 mg | Freq: Four times a day (QID) | RECTAL | Status: DC | PRN
  Administered 2014-02-15: 650 mg via RECTAL
  Filled 2014-02-15: qty 1

## 2014-02-15 MED ORDER — ACETAMINOPHEN 325 MG PO TABS: 650.0000 mg | ORAL_TABLET | Freq: Four times a day (QID) | ORAL | Status: DC | PRN

## 2014-02-15 MED ORDER — ONDANSETRON HCL 4 MG/2ML IJ SOLN: 4.0000 mg | Freq: Four times a day (QID) | INTRAMUSCULAR | Status: DC | PRN

## 2014-02-15 MED ORDER — SODIUM CHLORIDE 0.9 % IJ SOLN
3.0000 mL | Freq: Two times a day (BID) | INTRAMUSCULAR | Status: DC
  Administered 2014-02-15 – 2014-02-16 (×3): 3 mL via INTRAVENOUS

## 2014-02-15 MED ORDER — METOPROLOL TARTRATE 1 MG/ML IV SOLN
2.5000 mg | Freq: Two times a day (BID) | INTRAVENOUS | Status: DC
  Administered 2014-02-15: 2.5 mg via INTRAVENOUS
  Filled 2014-02-15: qty 5

## 2014-02-15 MED ORDER — HEPARIN SODIUM (PORCINE) 5000 UNIT/ML IJ SOLN
5000.0000 [IU] | Freq: Three times a day (TID) | INTRAMUSCULAR | Status: DC
  Administered 2014-02-15 – 2014-02-16 (×3): 5000 [IU] via SUBCUTANEOUS
  Filled 2014-02-15 (×6): qty 1

## 2014-02-15 MED ORDER — DIAZEPAM 5 MG/ML IJ SOLN
5.0000 mg | Freq: Once | INTRAMUSCULAR | Status: AC
  Administered 2014-02-15: 5 mg via INTRAVENOUS
  Filled 2014-02-15: qty 2

## 2014-02-15 MED ORDER — ALUM & MAG HYDROXIDE-SIMETH 200-200-20 MG/5ML PO SUSP: 30.0000 mL | Freq: Four times a day (QID) | ORAL | Status: DC | PRN

## 2014-02-15 MED ORDER — ASPIRIN 325 MG PO TABS
325.0000 mg | ORAL_TABLET | Freq: Every day | ORAL | Status: DC
  Filled 2014-02-15 (×2): qty 1

## 2014-02-15 MED ORDER — ONDANSETRON HCL 4 MG PO TABS: 4.0000 mg | ORAL_TABLET | Freq: Four times a day (QID) | ORAL | Status: DC | PRN

## 2014-02-15 MED ORDER — SODIUM CHLORIDE 0.9 % IV SOLN
INTRAVENOUS | Status: DC
  Administered 2014-02-15 – 2014-02-16 (×3): via INTRAVENOUS

## 2014-02-15 MED ORDER — BIOTENE DRY MOUTH MT LIQD
15.0000 mL | Freq: Two times a day (BID) | OROMUCOSAL | Status: DC
  Administered 2014-02-15 – 2014-02-16 (×2): 15 mL via OROMUCOSAL

## 2014-02-15 MED ORDER — DIAZEPAM 5 MG/ML IJ SOLN: 5.0000 mg | INTRAMUSCULAR | Status: DC | PRN

## 2014-02-15 MED ORDER — PANTOPRAZOLE SODIUM 40 MG IV SOLR
40.0000 mg | INTRAVENOUS | Status: DC
  Administered 2014-02-15: 40 mg via INTRAVENOUS
  Filled 2014-02-15 (×2): qty 40

## 2014-02-15 MED ORDER — LEVETIRACETAM IN NACL 1000 MG/100ML IV SOLN
1000.0000 mg | Freq: Once | INTRAVENOUS | Status: AC
  Administered 2014-02-15: 1000 mg via INTRAVENOUS
  Filled 2014-02-15: qty 100

## 2014-02-15 MED ORDER — DEXTROSE-NACL 5-0.9 % IV SOLN
INTRAVENOUS | Status: DC
  Administered 2014-02-15: 11:00:00 via INTRAVENOUS

## 2014-02-15 MED ORDER — STROKE: EARLY STAGES OF RECOVERY BOOK
Freq: Once | Status: AC
  Administered 2014-02-15: 12:00:00
  Filled 2014-02-15 (×2): qty 1

## 2014-02-15 MED ORDER — LORAZEPAM 2 MG/ML IJ SOLN: 0.5000 mg | Freq: Once | INTRAMUSCULAR | Status: AC | PRN

## 2014-02-15 MED ORDER — SODIUM CHLORIDE 0.9 % IV SOLN: INTRAVENOUS | Status: DC

## 2014-02-15 MED ORDER — LEVETIRACETAM IN NACL 500 MG/100ML IV SOLN
500.0000 mg | Freq: Two times a day (BID) | INTRAVENOUS | Status: DC
  Administered 2014-02-15 – 2014-02-16 (×2): 500 mg via INTRAVENOUS
  Filled 2014-02-15 (×4): qty 100

## 2014-02-15 NOTE — Progress Notes (Signed)
*  PRELIMINARY RESULTS* Vascular Ultrasound Carotid Duplex (Doppler) has been completed.  Preliminary findings: Bilateral:  1-39% ICA stenosis.  Vertebral artery flow is antegrade.      Miqueas Whilden FRANCES 03/11/2014, 5:31 PM

## 2014-02-15 NOTE — Progress Notes (Signed)
Echo Lab  2D Echocardiogram completed.  [place], RDCS 03/01/2014 1:56 PM

## 2014-02-15 NOTE — Progress Notes (Signed)
Called from MRI- noted to have ischemic infarct on MRI  Plan:  MRA, ASA, carotid duplex, ECHO  Focus stroke order set A1c, Lipid panel, swallow screen Statin when alert and able to swallow  Stroke team to see in AM  Debbe Odea, MD [facility]

## 2014-02-15 NOTE — H&P (Addendum)
[facility] History and Physical  [name] EGB:[id number] DOB: 1931/07/08 DOA: 03/12/2014  PCP: Philis Fendt, MD   Chief Complaint: "slumped over"  HPI: [name] is a 80 y.o. female with PMH as below, currently sleepy and history obtained from son. The son states that the patient's friend was there and watched the episode. (see consult by Dr Armida Sans). She slumped over in the chair after eating which apparently is normal for her after eating. The friend tried to wake her up to take her to bed but noted that she wasn't responding and was drooling. Her son was called and he found her unresponsive and called EMS. Per Dr Glennon Hamilton note, EMS found her with her jaw clenched and noted incontinence of urine.    ROS- unable to obtain due to sedated state  Past Medical History  Diagnosis Date  . Arthritis   . Hypertension   . History of gout   . Glaucoma   . Cataracts, bilateral   . Hyperlipidemia   . Gallstones   . Cerebrovascular disease   . Hyponatremia     a. H/o 2011 ?SIADH with seizures, encephalopathy at that time.  . Temporal arteritis   . Cardiac arrest 05/2012    a.  x 2, thought due to renal failure and hypoK;  b.  Echo 10/13: EF 37-10%, grade 1 diastolic dysfunction, dilated RV (RV function appears depressed), mild to moderate TR, PASP 35;   c.  Myoview performed but inconclusive  . Dementia 06/19/2012  . Coronary artery disease     a. Neg stress test 2008. b. ?questionable history - pt thought she had heart cath in 1970's but details were unclear.  . Acute kidney injury   . CKD (chronic kidney disease), stage III   . Seizures     a. Due to hyponatremia, resolved.  . Stroke     a. Details unclear.  Marland Kitchen DVT (deep venous thrombosis)    Past Surgical History  Procedure Laterality Date  . Eye surgery    . Abdominal hysterectomy    . Coronary angioplasty with stent placement     Social History:  reports that she has never smoked. She has never used smokeless  tobacco. She reports that she does not drink alcohol or use illicit drugs. Lives at home alone- son checks on her everyday and cooks and  Cleans. .  Does her own ADLs  Allergies  Allergen Reactions  . Lactose Intolerance (Gi) Diarrhea and Nausea Only    Family History  Problem Relation Age of Onset  . Hypertension    . Diabetes type II    . Hypertension Mother   . Hypertension Father       Prior to Admission medications   Medication Sig Start Date End Date Taking? Authorizing Provider  acetaminophen (TYLENOL) 325 MG tablet Take 325 mg by mouth daily as needed for pain. For pain   Yes Historical Provider, MD  allopurinol (ZYLOPRIM) 100 MG tablet Take 100 mg by mouth daily.   Yes Historical Provider, MD  bisoprolol (ZEBETA) 5 MG tablet Take 1 tablet (5 mg total) by mouth daily. 07/07/12  Yes Vivi Ferns, MD  escitalopram (LEXAPRO) 5 MG tablet Take 1 tablet (5 mg total) by mouth daily. 08/19/12  Yes Ripudeep Krystal Eaton, MD  ezetimibe (ZETIA) 10 MG tablet Take 1 tablet (10 mg total) by mouth daily. 07/07/12  Yes Vivi Ferns, MD  feeding supplement (ENSURE COMPLETE) LIQD Take 237 mLs by mouth 2 (  two) times daily between meals. Also available OTC 08/19/12  Yes Ripudeep K Rai, MD  levETIRAcetam (KEPPRA) 250 MG tablet Take 250 mg by mouth 2 (two) times daily.  07/07/12  Yes Vivi Ferns, MD  pantoprazole (PROTONIX) 40 MG tablet Take 40 mg by mouth daily.   Yes Historical Provider, MD   Medications reviewed with son- they are correct.  Physical Exam: Vitals Filed Vitals:   02/28/2014 0530 02/19/2014 0545 03/09/2014 0600 02/24/2014 0700  BP: 148/74 145/82 152/71 146/84  Pulse: 67 67 70 71  Resp: 14 18 17 19   SpO2: 95% 95% 95% 96%      General: lethargic- winces to pain and when trying to open her eyes, smells of urine HEENT: Normocephalic and Atraumatic                PERRLA;  No scleral icterus,                       Neck: FROM, no cervical lymphadenopathy, thyromegaly, carotid bruit or JVD;   Breasts: deferred CHEST WALL: No tenderness  CHEST: Normal respiration, clear to auscultation bilaterally  HEART: Regular rate and rhythm; no murmurs rubs or gallops  BACK: No kyphosis or scoliosis; no CVA tenderness  ABDOMEN: Positive Bowel Sounds, soft, non-tender; no masses, no organomegaly Rectal Exam: deferred EXTREMITIES: No cyanosis, clubbing, or edema Genitalia: not examined  SKIN:  no rash or ulceration  CNS: lethargic, extremities flaccid, no gross CN deficts  Labs on Admission:  Basic Metabolic Panel:  Recent Labs Lab 03/07/2014 0058 02/13/2014 0113  NA 136* 139  K 4.8 4.7  CL 97 103  CO2 19  --   GLUCOSE 183* 187*  BUN 21 21  CREATININE 1.46* 1.60*  CALCIUM 9.6  --    Liver Function Tests:  Recent Labs Lab 03/15/2014 0058  AST 19  ALT 11  ALKPHOS 64  BILITOT <0.2*  PROT 7.9  ALBUMIN 3.4*   No results found for this basename: LIPASE, AMYLASE,  in the last 168 hours No results found for this basename: AMMONIA,  in the last 168 hours CBC:  Recent Labs Lab 03/10/2014 0058 03/08/2014 0113  WBC 8.7  --   NEUTROABS 6.5  --   HGB 13.3 15.6*  HCT 38.4 46.0  MCV 95.8  --   PLT 168  --    Cardiac Enzymes:  Recent Labs Lab 02/27/2014 0058  CKTOTAL 106    BNP (last 3 results) No results found for this basename: PROBNP,  in the last 8760 hours CBG: No results found for this basename: GLUCAP,  in the last 168 hours  Radiological Exams on Admission: Ct Head Wo Contrast  03/05/2014   CLINICAL DATA:  Altered mental status.  EXAM: CT HEAD WITHOUT CONTRAST  TECHNIQUE: Contiguous axial images were obtained from the base of the skull through the vertex without intravenous contrast.  COMPARISON:  None.  FINDINGS: No acute cortical infarct, hemorrhage, or mass lesion ispresent. Ventricles are of normal size. No significant extra-axial fluid collection is present. The paranasal sinuses andmastoid air cells are clear. The osseous skull is intact.  IMPRESSION: 1. Normal  brain.  No acute intracranial abnormalities.   Electronically Signed   By: Kerby Moors M.D.   On: 02/15/2014 01:53   Dg Chest Port 1 View  02/15/2014   CLINICAL DATA:  Acute mental status change.  EXAM: PORTABLE CHEST - 1 VIEW  COMPARISON:  08/14/2012  FINDINGS: Heart size is normal.  There is slight asymmetric elevation of the right hemidiaphragm. Thickening of the minor fissure is noted. No effusions or edema identified. No airspace consolidation.  IMPRESSION: 1. No acute findings   Electronically Signed   By: Kerby Moors M.D.   On: 02/18/2014 01:36    EKG: Independently reviewed. NSR  Assessment/Plan Principal Problem:   Unresponsive episode - seizure in the past apparently related to hyponatremia - appears to have been a seizure this time - Neuro managing with a 1 g bolus of Keppra and increasing her maintenance to 500 BID - NPO while unresponsive, IVF while NPO - f/u MRI and EEG - admit to tele with a camera room  Active Problems:   Monoclonal gammopathy - has seen Dr Jana Hakim for this- plan was to monitor     History of gout - hold Allopurinol until she awakens    Hyperlipidemia - hold Zetia for now    Hypertension - on Bisoprolol - will switch to IV Metoprolol until she awakens    GERD (gastroesophageal reflux disease) - IV Protonix for now    CKD (chronic kidney disease) stage 3, GFR 30-59 ml/min - stable since 2009  UTI - send for culture- hold off on treating for now as it appears mild- treat if culture positive - per son, pt gets confused when she has a UTI- follow mental status    Consulted: Neuro  Code Status: DNR per son- he states she has discussed this in the past Family Communication: son- [name]  Disposition Plan: to be determined  Time spent: >45 min  Taji Sather, MD [facility]  If 7PM-7AM, please contact night-coverage [email or web address] 03/05/2014, 8:29 AM

## 2014-02-15 NOTE — Progress Notes (Signed)
Utilization Review Completed.Donne Anon T7/10/2013

## 2014-02-15 NOTE — ED Notes (Signed)
Called report to [place] in [facility].  During report, bed assignment changed.

## 2014-02-15 NOTE — ED Notes (Signed)
Spoke with Neurology about the status of patient being admitted.

## 2014-02-15 NOTE — ED Provider Notes (Addendum)
CSN: [id number]     Arrival date & time 02/13/2014  0032 History   First MD Initiated Contact with Patient 03/10/2014 0034     Chief Complaint  Patient presents with  . Altered Mental Status     (Consider location/radiation/quality/duration/timing/severity/associated sxs/prior Treatment) HPI This patient is an 80 yo woman with multiple chronic medical problems including CKD stage III, HTN, history of CVA, history of seizures.   She is BIB EMS with altered mental status. The patient's son reports that the patient was eating dinner at a friend's  House. She announced that she was full and pushed her plate to the side and, within a minute or two, she slumped over and was noted to have drooling but no unusual motor activity.   Apparently, the patient has a history of seizure in the settting of hyponatremia. She has mild dementia at baseline and ambulates with a cane.  The patient's day had been unremarkable prior to the above described event. I am unable to obtain hx from the patient due to AMS.  Past Medical History  Diagnosis Date  . Arthritis   . Hypertension   . History of gout   . Glaucoma   . Cataracts, bilateral   . Hyperlipidemia   . Gallstones   . Cerebrovascular disease   . Hyponatremia     a. H/o 2011 ?SIADH with seizures, encephalopathy at that time.  . Temporal arteritis   . Cardiac arrest 05/2012    a.  x 2, thought due to renal failure and hypoK;  b.  Echo 10/13: EF 67-34%, grade 1 diastolic dysfunction, dilated RV (RV function appears depressed), mild to moderate TR, PASP 35;   c.  Myoview performed but inconclusive  . Dementia 06/19/2012  . Coronary artery disease     a. Neg stress test 2008. b. ?questionable history - pt thought she had heart cath in 1970's but details were unclear.  . Acute kidney injury   . CKD (chronic kidney disease), stage III   . Seizures     a. Due to hyponatremia, resolved.  . Stroke     a. Details unclear.  Marland Kitchen DVT (deep venous thrombosis)     Past Surgical History  Procedure Laterality Date  . Eye surgery    . Abdominal hysterectomy    . Coronary angioplasty with stent placement     Family History  Problem Relation Age of Onset  . Hypertension    . Diabetes type II    . Hypertension Mother   . Hypertension Father    History  Substance Use Topics  . Smoking status: Never Smoker   . Smokeless tobacco: Never Used  . Alcohol Use: No     Comment: used to drink beer heavily per old records.    OB History   Grav Para Term Preterm Abortions TAB SAB Ect Mult Living                 Review of Systems Unable to obtain from the patient - please note level 5 caveat applies.    Allergies  Lactose intolerance (gi)  Home Medications   Prior to Admission medications   Medication Sig Start Date End Date Taking? Authorizing Provider  acetaminophen (TYLENOL) 325 MG tablet Take 325 mg by mouth daily as needed for pain. For pain   Yes Historical Provider, MD  allopurinol (ZYLOPRIM) 100 MG tablet Take 100 mg by mouth daily.   Yes Historical Provider, MD  bisoprolol (ZEBETA) 5 MG tablet Take  1 tablet (5 mg total) by mouth daily. 07/07/12  Yes Vivi Ferns, MD  escitalopram (LEXAPRO) 5 MG tablet Take 1 tablet (5 mg total) by mouth daily. 08/19/12  Yes Ripudeep Krystal Eaton, MD  ezetimibe (ZETIA) 10 MG tablet Take 1 tablet (10 mg total) by mouth daily. 07/07/12  Yes Vivi Ferns, MD  feeding supplement (ENSURE COMPLETE) LIQD Take 237 mLs by mouth 2 (two) times daily between meals. Also available OTC 08/19/12  Yes Ripudeep K Rai, MD  levETIRAcetam (KEPPRA) 250 MG tablet Take 250 mg by mouth 2 (two) times daily.  07/07/12  Yes Vivi Ferns, MD  pantoprazole (PROTONIX) 40 MG tablet Take 40 mg by mouth daily.   Yes Historical Provider, MD   BP 152/71  Pulse 70  Resp 17  SpO2 95% Physical Exam  Neurological: GCS eye subscore is 1. GCS verbal subscore is 1. GCS motor subscore is 5.   Gen: well developed and well nourished appearing, appears  to be sleeping soundly but is unresponsive to loud verbal stimuli Head: NCAT Eyes: cataracts on left, surgical pupil on right Nose: no epistaixis or rhinorrhea Mouth/throat: mucosa is moist and pink, gag reflex present Neck: supple, no stridor Lungs: CTA B, no wheezing, rhonchi or rales CV: RRR, no murmur, extremities appear well perfused.  Abd: soft,  nondistended Back: normal to inspection Skin: warm and dry Ext: normal to inspection, no dependent edema Neuro: CN ii-xii grossly intact, no focal deficits Psyche; withdraws all four extremities to pain, seems to have less vigorous withdrawal with to noxious stimuli applied to the right arm.   ED Course  Procedures (including critical care time) Labs Review Labs Reviewed  COMPREHENSIVE METABOLIC PANEL - Abnormal; Notable for the following:    Sodium 136 (*)    Glucose, Bld 183 (*)    Creatinine, Ser 1.46 (*)    Albumin 3.4 (*)    Total Bilirubin <0.2 (*)    GFR calc non Af Amer 33 (*)    GFR calc Af Amer 38 (*)    Anion gap 20 (*)    All other components within normal limits  I-STAT CHEM 8, ED - Abnormal; Notable for the following:    Creatinine, Ser 1.60 (*)    Glucose, Bld 187 (*)    Hemoglobin 15.6 (*)    All other components within normal limits  I-STAT VENOUS BLOOD GAS, ED - Abnormal; Notable for the following:    pH, Ven 7.373 (*)    pCO2, Ven 36.2 (*)    pO2, Ven 53.0 (*)    Acid-base deficit 4.0 (*)    All other components within normal limits  CBC WITH DIFFERENTIAL  URINE RAPID DRUG SCREEN (HOSP PERFORMED)  ETHANOL  CK  PROLACTIN  CBG MONITORING, ED  I-STAT TROPOININ, ED  I-STAT CG4 LACTIC ACID, ED  TYPE AND SCREEN    Imaging Review Ct Head Wo Contrast  03/09/2014   CLINICAL DATA:  Altered mental status.  EXAM: CT HEAD WITHOUT CONTRAST  TECHNIQUE: Contiguous axial images were obtained from the base of the skull through the vertex without intravenous contrast.  COMPARISON:  None.  FINDINGS: No acute cortical  infarct, hemorrhage, or mass lesion ispresent. Ventricles are of normal size. No significant extra-axial fluid collection is present. The paranasal sinuses andmastoid air cells are clear. The osseous skull is intact.  IMPRESSION: 1. Normal brain.  No acute intracranial abnormalities.   Electronically Signed   By: Kerby Moors M.D.   On:  03/09/2014 01:53   Dg Chest Port 1 View  03/10/2014   CLINICAL DATA:  Acute mental status change.  EXAM: PORTABLE CHEST - 1 VIEW  COMPARISON:  08/14/2012  FINDINGS: Heart size is normal. There is slight asymmetric elevation of the right hemidiaphragm. Thickening of the minor fissure is noted. No effusions or edema identified. No airspace consolidation.  IMPRESSION: 1. No acute findings   Electronically Signed   By: Kerby Moors M.D.   On: 02/25/2014 01:36    Results for orders placed during the hospital encounter of 03/14/2014 (from the past 24 hour(s))  TYPE AND SCREEN     Status: None   Collection Time    02/27/2014 12:48 AM      Result Value Ref Range   ABO/RH(D) A NEG     Antibody Screen NEG     Sample Expiration 02/18/2014    CBC WITH DIFFERENTIAL     Status: None   Collection Time    02/23/2014 12:58 AM      Result Value Ref Range   WBC 8.7  4.0 - 10.5 K/uL   RBC 4.01  3.87 - 5.11 MIL/uL   Hemoglobin 13.3  12.0 - 15.0 g/dL   HCT 38.4  36.0 - 46.0 %   MCV 95.8  78.0 - 100.0 fL   MCH 33.2  26.0 - 34.0 pg   MCHC 34.6  30.0 - 36.0 g/dL   RDW 13.9  11.5 - 15.5 %   Platelets 168  150 - 400 K/uL   Neutrophils Relative % 75  43 - 77 %   Neutro Abs 6.5  1.7 - 7.7 K/uL   Lymphocytes Relative 21  12 - 46 %   Lymphs Abs 1.8  0.7 - 4.0 K/uL   Monocytes Relative 4  3 - 12 %   Monocytes Absolute 0.4  0.1 - 1.0 K/uL   Eosinophils Relative 0  0 - 5 %   Eosinophils Absolute 0.0  0.0 - 0.7 K/uL   Basophils Relative 0  0 - 1 %   Basophils Absolute 0.0  0.0 - 0.1 K/uL  COMPREHENSIVE METABOLIC PANEL     Status: Abnormal   Collection Time    02/18/2014 12:58 AM       Result Value Ref Range   Sodium 136 (*) 137 - 147 mEq/L   Potassium 4.8  3.7 - 5.3 mEq/L   Chloride 97  96 - 112 mEq/L   CO2 19  19 - 32 mEq/L   Glucose, Bld 183 (*) 70 - 99 mg/dL   BUN 21  6 - 23 mg/dL   Creatinine, Ser 1.46 (*) 0.50 - 1.10 mg/dL   Calcium 9.6  8.4 - 10.5 mg/dL   Total Protein 7.9  6.0 - 8.3 g/dL   Albumin 3.4 (*) 3.5 - 5.2 g/dL   AST 19  0 - 37 U/L   ALT 11  0 - 35 U/L   Alkaline Phosphatase 64  39 - 117 U/L   Total Bilirubin <0.2 (*) 0.3 - 1.2 mg/dL   GFR calc non Af Amer 33 (*) >90 mL/min   GFR calc Af Amer 38 (*) >90 mL/min   Anion gap 20 (*) 5 - 15  ETHANOL     Status: None   Collection Time    02/21/2014 12:58 AM      Result Value Ref Range   Alcohol, Ethyl (B) <11  0 - 11 mg/dL  CK     Status: None  Collection Time    02/21/2014 12:58 AM      Result Value Ref Range   Total CK 106  7 - 177 U/L  I-STAT TROPOININ, ED     Status: None   Collection Time    02/20/2014  1:12 AM      Result Value Ref Range   Troponin i, poc 0.00  0.00 - 0.08 ng/mL   Comment 3           I-STAT CHEM 8, ED     Status: Abnormal   Collection Time    03/09/2014  1:13 AM      Result Value Ref Range   Sodium 139  137 - 147 mEq/L   Potassium 4.7  3.7 - 5.3 mEq/L   Chloride 103  96 - 112 mEq/L   BUN 21  6 - 23 mg/dL   Creatinine, Ser 1.60 (*) 0.50 - 1.10 mg/dL   Glucose, Bld 187 (*) 70 - 99 mg/dL   Calcium, Ion 1.23  1.13 - 1.30 mmol/L   TCO2 20  0 - 100 mmol/L   Hemoglobin 15.6 (*) 12.0 - 15.0 g/dL   HCT 46.0  36.0 - 46.0 %  I-STAT CG4 LACTIC ACID, ED     Status: None   Collection Time    02/19/2014  1:13 AM      Result Value Ref Range   Lactic Acid, Venous 1.91  0.5 - 2.2 mmol/L  I-STAT VENOUS BLOOD GAS, ED     Status: Abnormal   Collection Time    03/05/2014  1:18 AM      Result Value Ref Range   pH, Ven 7.373 (*) 7.250 - 7.300   pCO2, Ven 36.2 (*) 45.0 - 50.0 mmHg   pO2, Ven 53.0 (*) 30.0 - 45.0 mmHg   Bicarbonate 21.1  20.0 - 24.0 mEq/L   TCO2 22  0 - 100 mmol/L   O2  Saturation 86.0     Acid-base deficit 4.0 (*) 0.0 - 2.0 mmol/L   Sample type VENOUS    URINE RAPID DRUG SCREEN (HOSP PERFORMED)     Status: None   Collection Time    02/18/2014  3:14 AM      Result Value Ref Range   Opiates NONE DETECTED  NONE DETECTED   Cocaine NONE DETECTED  NONE DETECTED   Benzodiazepines NONE DETECTED  NONE DETECTED   Amphetamines NONE DETECTED  NONE DETECTED   Tetrahydrocannabinol NONE DETECTED  NONE DETECTED   Barbiturates NONE DETECTED  NONE DETECTED   CRITICAL CARE Performed by: Elyn Peers   Total critical care time 30m  Critical care time was exclusive of separately billable procedures and treating other patients.  Critical care was necessary to treat or prevent imminent or life-threatening deterioration.  Critical care was time spent personally by me on the following activities: development of treatment plan with patient and/or surrogate as well as nursing, discussions with consultants, evaluation of patient's response to treatment, examination of patient, obtaining history from patient or surrogate, ordering and performing treatments and interventions, ordering and review of laboratory studies, ordering and review of radiographic studies, pulse oximetry and re-evaluation of patient's condition.  EKG: nsr, no acute ischemic changes, normal intervals, normal axis, normal qrs complex  MDM   Patient with persistent mental status changes in the ED. We have ruled out metabolic disturbance, the patient does not appear to have an infectious process. Concern for subclinical status epilepticus lead to administration of valium 5mg  IV and load  with Keppra. MS is unchanged. Dr. Aram Beecham was consulted shortly after the patient's presentation to the ED. He does not feel that she meets criteria to administer tPA for stroke. He shares concern about possible seizure activity and recommends step down admission with plan to MRI and obtain EEG. Case discussed with Dr. Berle Mull  who has accepted the patient for admission.    Elyn Peers, MD 02/14/2014 [place], MD 02/23/2014 [phone]

## 2014-02-15 NOTE — ED Notes (Signed)
Patient arrived via [facility] from boyfriends house with altered mental status. EMS states boyfriend and son were not very informative. No idea as to when her last seen normal was. Patient arrived AMS with only movement with painful stimuli. Incontinent of urine. EDP made aware of patient's status. Patient is hypertensive.

## 2014-02-15 NOTE — Progress Notes (Signed)
Spoke with Dr. Doy Mince who stated that a second IV was not needed. 500cc NS bolus started and then 100cc/hr NS will run after that. Pt is DNR. Family was updated.

## 2014-02-15 NOTE — Progress Notes (Signed)
EEG started, but muscle artifact overshadowed tracing.  Dr Doy Mince notified, EEG stopped.

## 2014-02-15 NOTE — Progress Notes (Addendum)
Progress Note: MRI and MRA results discussed with radiology and reviewed.  Significant posterior circulation disease noted.  Patient remains unresponsive.  Case discussed with Dr. [place] Constable from [place] [facility].  He did not feel that there was anything that he could offer the patient at this time that wold significantly affect her prognosis which he feels is poor.  No transfer required.  This information was relayed to Dr. Leonie Man.  Discussion had with family and Dr. Wynelle Cleveland.    Plan: 1.  Patient to remain DNR and therefore may remain on 2C with frequent neuro checks 2.  Permissive BP 3.  ASA   Alexis Goodell, MD [facility] [phone]

## 2014-02-15 NOTE — ED Notes (Signed)
Attempted to call report

## 2014-02-15 NOTE — ED Notes (Signed)
Family at bedside. 

## 2014-02-15 NOTE — ED Notes (Signed)
Patient is able to squeeze right hand and attempts to with her left. She has her jaw clamped shut and her eyes squeezed tight.

## 2014-02-15 NOTE — Consult Note (Signed)
Referring Physician: ED    Chief Complaint: altered mental status  HPI:                                                                                                                                         [name] is an 80 y.o. female with multiple medical problems including HTN, hyperlipidemia, CKD stage III, provoked seizure in the context of hyponatremia years ago, temporal arteritis, DVT, ? Stroke, dementia, recent UTI, brought in by EMS due to altered mental status. Her son is at the bedside and tells me that she was sitting in a chair, and after finishing eating a piece of fish she slumped over, became unresponsive, and started foaming from her mouth. No jerking movements noted. EMS was summoned and patient brought to the ED where she was unresponsive, jaw clinched, and was incontinent of urine. Received 5 mg IV  Valium. No recent fever or head trauma. Patient takes keppra 250 mg BID. CT brain showed no acute intracranial abnormality and serologies are unimpressive. Patient doesn't open her eyes but moves all limbs spontaneously and reacts to pain. Date last known well: 02/14/14  Time last known well: 2245 tPA Given: no, out of the window by the time neuro was consulted   Past Medical History  Diagnosis Date  . Arthritis   . Hypertension   . History of gout   . Glaucoma   . Cataracts, bilateral   . Hyperlipidemia   . Gallstones   . Cerebrovascular disease   . Hyponatremia     a. H/o 2011 ?SIADH with seizures, encephalopathy at that time.  . Temporal arteritis   . Cardiac arrest 05/2012    a.  x 2, thought due to renal failure and hypoK;  b.  Echo 10/13: EF 24-82%, grade 1 diastolic dysfunction, dilated RV (RV function appears depressed), mild to moderate TR, PASP 35;   c.  Myoview performed but inconclusive  . Dementia 06/19/2012  . Coronary artery disease     a. Neg stress test 2008. b. ?questionable history - pt thought she had heart cath in 1970's but details were  unclear.  . Acute kidney injury   . CKD (chronic kidney disease), stage III   . Seizures     a. Due to hyponatremia, resolved.  . Stroke     a. Details unclear.  Marland Kitchen DVT (deep venous thrombosis)     Past Surgical History  Procedure Laterality Date  . Eye surgery    . Abdominal hysterectomy    . Coronary angioplasty with stent placement      Family History  Problem Relation Age of Onset  . Hypertension    . Diabetes type II    . Hypertension Mother   . Hypertension Father    Social History:  reports that she has never smoked. She has never used smokeless tobacco. She reports that she does  not drink alcohol or use illicit drugs.  Allergies:  Allergies  Allergen Reactions  . Lactose Intolerance (Gi) Diarrhea and Nausea Only    Medications:                                                                                                                           I have reviewed the patient's current medications.  ROS:  Unable to obtain due to mental status.                                                                                                                    History obtained from family and chart review  Physical exam: unresponsive. Blood pressure 159/90, pulse 65, resp. rate 13, SpO2 97.00%. Head: normocephalic. Neck: supple, no bruits, no JVD. Cardiac: no murmurs. Lungs: clear. Abdomen: soft, no tender, no mass. Extremities: no edema.  Neurologic Examination:                                                                                                      Mental status: unresponsive to verbal stimuli. CN 2-12: left pupil s/p cataract surgery, right pupil 4 mm and reactive. No gaze preference. Doesn't blink to threat. Face symmetric. Resists mouth opening. Motor: moves all limbs spontaneously. Sensory: withdraws to pain. DTR's: 2 all over. Plantars: right upgoing, left down. Coordination and gait: unable to test. No meningeal signs.  Results for orders  placed during the hospital encounter of 03/15/2014 (from the past 48 hour(s))  TYPE AND SCREEN     Status: None   Collection Time    02/27/2014 12:48 AM      Result Value Ref Range   ABO/RH(D) A NEG     Antibody Screen NEG     Sample Expiration 02/18/2014    CBC WITH DIFFERENTIAL     Status: None   Collection Time    03/06/2014 12:58 AM      Result Value Ref Range   WBC 8.7  4.0 - 10.5 K/uL   RBC 4.01  3.87 - 5.11 MIL/uL  Hemoglobin 13.3  12.0 - 15.0 g/dL   HCT 38.4  36.0 - 46.0 %   MCV 95.8  78.0 - 100.0 fL   MCH 33.2  26.0 - 34.0 pg   MCHC 34.6  30.0 - 36.0 g/dL   RDW 13.9  11.5 - 15.5 %   Platelets 168  150 - 400 K/uL   Neutrophils Relative % 75  43 - 77 %   Neutro Abs 6.5  1.7 - 7.7 K/uL   Lymphocytes Relative 21  12 - 46 %   Lymphs Abs 1.8  0.7 - 4.0 K/uL   Monocytes Relative 4  3 - 12 %   Monocytes Absolute 0.4  0.1 - 1.0 K/uL   Eosinophils Relative 0  0 - 5 %   Eosinophils Absolute 0.0  0.0 - 0.7 K/uL   Basophils Relative 0  0 - 1 %   Basophils Absolute 0.0  0.0 - 0.1 K/uL  COMPREHENSIVE METABOLIC PANEL     Status: Abnormal   Collection Time    02/22/2014 12:58 AM      Result Value Ref Range   Sodium 136 (*) 137 - 147 mEq/L   Potassium 4.8  3.7 - 5.3 mEq/L   Chloride 97  96 - 112 mEq/L   CO2 19  19 - 32 mEq/L   Glucose, Bld 183 (*) 70 - 99 mg/dL   BUN 21  6 - 23 mg/dL   Creatinine, Ser 1.46 (*) 0.50 - 1.10 mg/dL   Calcium 9.6  8.4 - 10.5 mg/dL   Total Protein 7.9  6.0 - 8.3 g/dL   Albumin 3.4 (*) 3.5 - 5.2 g/dL   AST 19  0 - 37 U/L   ALT 11  0 - 35 U/L   Alkaline Phosphatase 64  39 - 117 U/L   Total Bilirubin <0.2 (*) 0.3 - 1.2 mg/dL   GFR calc non Af Amer 33 (*) >90 mL/min   GFR calc Af Amer 38 (*) >90 mL/min   Comment: (NOTE)     The eGFR has been calculated using the CKD EPI equation.     This calculation has not been validated in all clinical situations.     eGFR's persistently <90 mL/min signify possible Chronic Kidney     Disease.   Anion gap 20 (*) 5 -  15  ETHANOL     Status: None   Collection Time    03/03/2014 12:58 AM      Result Value Ref Range   Alcohol, Ethyl (B) <11  0 - 11 mg/dL   Comment:            LOWEST DETECTABLE LIMIT FOR     SERUM ALCOHOL IS 11 mg/dL     FOR MEDICAL PURPOSES ONLY  CK     Status: None   Collection Time    03/10/2014 12:58 AM      Result Value Ref Range   Total CK 106  7 - 177 U/L  I-STAT TROPOININ, ED     Status: None   Collection Time    03/14/2014  1:12 AM      Result Value Ref Range   Troponin i, poc 0.00  0.00 - 0.08 ng/mL   Comment 3            Comment: Due to the release kinetics of cTnI,     a negative result within the first hours     of the onset of symptoms does not rule out  myocardial infarction with certainty.     If myocardial infarction is still suspected,     repeat the test at appropriate intervals.  I-STAT CHEM 8, ED     Status: Abnormal   Collection Time    03/10/2014  1:13 AM      Result Value Ref Range   Sodium 139  137 - 147 mEq/L   Potassium 4.7  3.7 - 5.3 mEq/L   Chloride 103  96 - 112 mEq/L   BUN 21  6 - 23 mg/dL   Creatinine, Ser 1.60 (*) 0.50 - 1.10 mg/dL   Glucose, Bld 187 (*) 70 - 99 mg/dL   Calcium, Ion 1.23  1.13 - 1.30 mmol/L   TCO2 20  0 - 100 mmol/L   Hemoglobin 15.6 (*) 12.0 - 15.0 g/dL   HCT 46.0  36.0 - 46.0 %  I-STAT CG4 LACTIC ACID, ED     Status: None   Collection Time    02/17/2014  1:13 AM      Result Value Ref Range   Lactic Acid, Venous 1.91  0.5 - 2.2 mmol/L  I-STAT VENOUS BLOOD GAS, ED     Status: Abnormal   Collection Time    02/13/2014  1:18 AM      Result Value Ref Range   pH, Ven 7.373 (*) 7.250 - 7.300   pCO2, Ven 36.2 (*) 45.0 - 50.0 mmHg   pO2, Ven 53.0 (*) 30.0 - 45.0 mmHg   Bicarbonate 21.1  20.0 - 24.0 mEq/L   TCO2 22  0 - 100 mmol/L   O2 Saturation 86.0     Acid-base deficit 4.0 (*) 0.0 - 2.0 mmol/L   Sample type VENOUS     Ct Head Wo Contrast  03/05/2014   CLINICAL DATA:  Altered mental status.  EXAM: CT HEAD WITHOUT CONTRAST   TECHNIQUE: Contiguous axial images were obtained from the base of the skull through the vertex without intravenous contrast.  COMPARISON:  None.  FINDINGS: No acute cortical infarct, hemorrhage, or mass lesion ispresent. Ventricles are of normal size. No significant extra-axial fluid collection is present. The paranasal sinuses andmastoid air cells are clear. The osseous skull is intact.  IMPRESSION: 1. Normal brain.  No acute intracranial abnormalities.   Electronically Signed   By: Kerby Moors M.D.   On: 02/14/2014 01:53   Dg Chest Port 1 View  02/21/2014   CLINICAL DATA:  Acute mental status change.  EXAM: PORTABLE CHEST - 1 VIEW  COMPARISON:  08/14/2012  FINDINGS: Heart size is normal. There is slight asymmetric elevation of the right hemidiaphragm. Thickening of the minor fissure is noted. No effusions or edema identified. No airspace consolidation.  IMPRESSION: 1. No acute findings   Electronically Signed   By: Kerby Moors M.D.   On: 02/26/2014 01:36    Assessment: 80 y.o. female with multiple medical problems including dementia and stroke ?, brought in due to altered mental status.  She is unresponsive to verbal commands but moves all limbs spontaneously and reacts to noxious stimuli.  Wonder if she had a partial seizure and has prolonged pos ictal state + sedation from valium. NCSE less likely. Her exam is not quite consistent with brainstem ischemia. Recommend: 1) Admit to stepdown. 2) keppra 1 gram IV now. 3) increase keppra to 500 mg BID. 4) MRI brain. 5) EEG. Will follow up.  Dorian Pod, MD [facility] [phone]  03/13/2014, 2:53 AM

## 2014-02-15 NOTE — ED Notes (Signed)
Attempted to call report to [facility], advised they would call back after speaking with physician to see if patient appropriate for their unit.

## 2014-02-15 NOTE — ED Notes (Addendum)
Patient's sons and boyfriend are at bedside. Boyfriend states that patient was sitting and talking to him when she slumped over and started to drool from her mouth. No seizure activity noted by him or EMS. Last seen normal was at 2245.

## 2014-02-16 DIAGNOSIS — Z515 Encounter for palliative care: Secondary | ICD-10-CM

## 2014-02-16 DIAGNOSIS — I6322 Cerebral infarction due to unspecified occlusion or stenosis of basilar arteries: Secondary | ICD-10-CM

## 2014-02-16 DIAGNOSIS — N189 Chronic kidney disease, unspecified: Secondary | ICD-10-CM

## 2014-02-16 DIAGNOSIS — N179 Acute kidney failure, unspecified: Secondary | ICD-10-CM

## 2014-02-16 DIAGNOSIS — I639 Cerebral infarction, unspecified: Secondary | ICD-10-CM | POA: Diagnosis present

## 2014-02-16 DIAGNOSIS — R404 Transient alteration of awareness: Secondary | ICD-10-CM

## 2014-02-16 LAB — BASIC METABOLIC PANEL
Anion gap: 19 — ABNORMAL HIGH (ref 5–15)
BUN: 23 mg/dL (ref 6–23)
CALCIUM: 8.5 mg/dL (ref 8.4–10.5)
CO2: 19 mEq/L (ref 19–32)
Chloride: 103 mEq/L (ref 96–112)
Creatinine, Ser: 1.83 mg/dL — ABNORMAL HIGH (ref 0.50–1.10)
GFR calc Af Amer: 29 mL/min — ABNORMAL LOW (ref >90)
GFR calc non Af Amer: 25 mL/min — ABNORMAL LOW (ref >90)
GLUCOSE: 132 mg/dL — AB (ref 70–99)
POTASSIUM: 4.5 meq/L (ref 3.7–5.3)
SODIUM: 141 meq/L (ref 137–147)

## 2014-02-16 LAB — CBC
HCT: 37.4 % (ref 36.0–46.0)
Hemoglobin: 12.5 g/dL (ref 12.0–15.0)
MCH: 33.2 pg (ref 26.0–34.0)
MCHC: 33.4 g/dL (ref 30.0–36.0)
MCV: 99.2 fL (ref 78.0–100.0)
PLATELETS: 123 10*3/uL — AB (ref 150–400)
RBC: 3.77 MIL/uL — ABNORMAL LOW (ref 3.87–5.11)
RDW: 14.2 % (ref 11.5–15.5)
WBC: 1.8 10*3/uL — ABNORMAL LOW (ref 4.0–10.5)

## 2014-02-16 LAB — LIPID PANEL
Cholesterol: 126 mg/dL (ref 0–200)
HDL: 62 mg/dL (ref >39)
LDL CALC: 52 mg/dL (ref 0–99)
Total CHOL/HDL Ratio: 2 RATIO
Triglycerides: 58 mg/dL (ref <150)
VLDL: 12 mg/dL (ref 0–40)

## 2014-02-16 LAB — HEMOGLOBIN A1C
Hgb A1c MFr Bld: 6.1 % — ABNORMAL HIGH (ref <5.7)
Mean Plasma Glucose: 128 mg/dL — ABNORMAL HIGH (ref <117)

## 2014-02-16 MED ORDER — MORPHINE SULFATE 2 MG/ML IJ SOLN
2.0000 mg | INTRAMUSCULAR | Status: DC | PRN
  Administered 2014-02-16 (×2): 2 mg via INTRAVENOUS
  Filled 2014-02-16 (×2): qty 1

## 2014-02-16 MED ORDER — DIAZEPAM 5 MG/ML IJ SOLN: 10.0000 mg | INTRAMUSCULAR | Status: DC | PRN

## 2014-02-16 MED ORDER — LORAZEPAM 2 MG/ML IJ SOLN: 1.0000 mg | INTRAMUSCULAR | Status: DC | PRN

## 2014-02-16 MED ORDER — DIAZEPAM 5 MG/ML IJ SOLN: 5.0000 mg | Freq: Four times a day (QID) | INTRAMUSCULAR | Status: DC

## 2014-02-16 MED ORDER — SODIUM CHLORIDE 0.9 % IV BOLUS (SEPSIS)
500.0000 mL | Freq: Once | INTRAVENOUS | Status: AC
  Administered 2014-02-16: 500 mL via INTRAVENOUS

## 2014-02-16 MED ORDER — SCOPOLAMINE 1 MG/3DAYS TD PT72
1.0000 | MEDICATED_PATCH | TRANSDERMAL | Status: DC
  Filled 2014-02-16: qty 1

## 2014-02-16 MED ORDER — ATROPINE SULFATE 1 % OP SOLN
2.0000 [drp] | OPHTHALMIC | Status: DC | PRN
  Filled 2014-02-16: qty 2

## 2014-02-17 NOTE — Progress Notes (Signed)
Chaplain Note: Chaplain responded to patient and family in [place]. Family and HCP reports that patient is actively dying. Son and his daughter were present at the time. Son asked that Chaplain come back for prayer once patient is transferred to [facility]. Chaplain later returned to greet the family. Immediate family was at patients bedside upon entering the room. Extended family was present outside the room. Before offering prayer, Chaplain asked family members to share their favorite memory with their mother. Familys spirits seemed to be lifted by this experience. Family socialized briefly before prayer. Chaplain prayed with family and offered hospitality before leaving the family to continue their visit. According to patients chart, she is said to have passed away shortly after the visit.  Sheryn Bison  03/02/14 0454  Clinical Encounter Type  Visited With Patient;Family;Health care provider  Visit Type Initial;Spiritual support;Social support;Patient actively dying  Referral From Nurse;Palliative care team  Spiritual Encounters  Spiritual Needs Prayer;Emotional;Grief support

## 2014-02-18 LAB — URINE CULTURE

## 2014-03-16 NOTE — Consult Note (Addendum)
Palliative Medicine Team Consult Note Requested by: Dr. Sherral Hammers, [place]  80 yo s/p Posterior circulation stroke, poor prognosis. PMT consulted for symptom management and hospice options.  Met with family-son, daughters and grandchildren.  I discussed her current diagnosis, disease trajectory and expectation for a hospital death. I confirmed the desire for comfort measures. I provided emotional support at the bedside. I discussed hospice should she survive through the weekend.  Assessment: Patient is actively dying of her acute stroke. She is having dyspnea and on NRB.  Recommendations:   Goals are full comfort  Anticipate hospital death-hours  Offered Chaplain  Morphine PRN dyspnea  Valium for agitation and seizures  Transfer to [address]-large family and they could use additional space  Transition NRB to nasal cannula   Time: 4PM-4:50PM  Total Time at bedside: 50 minutes Greater than 50%  of this time was spent counseling and coordinating care related to the above assessment and plan.   Lane Hacker, DO Palliative Medicine

## 2014-03-16 NOTE — Progress Notes (Signed)
Occupational Therapy Discharge Patient Details Name: [name] MRN: [id number] DOB: [birth year] Today's Date: 17-Mar-2014 Time:  -     Patient discharged from OT services secondary to medical decline - will need to re-order OT to resume therapy services.  Please see latest therapy progress note for current level of functioning and progress toward goals.    Progress and discharge plan discussed with patient and/or caregiver: Chart reviewed.  pt is unsresponsive, plan is to move toward comfort care.  Palliative care has been consulted.  At this time, OT not appropriate.  If condition improves and pt becomes appropriate for OT, please reorder.   [place], [place], OTR/L [phone]  03/17/2014, 9:07 AM

## 2014-03-16 NOTE — Progress Notes (Signed)
Stroke Team Progress Note  HISTORY [name] is an 80 y.o. female with multiple medical problems including HTN, hyperlipidemia, CKD stage III, provoked seizure in the context of hyponatremia years ago, temporal arteritis, DVT, ? Stroke, dementia, recent UTI, brought in by EMS due to altered mental status.  Her son stated that she was sitting in a chair, and after finishing eating a piece of fish she slumped over, became unresponsive, and started foaming from her mouth. No jerking movements noted. EMS was summoned and patient brought to the ED where she was unresponsive, jaw clinched, and was incontinent of urine.  Received 5 mg IV Valium.  No recent fever or head trauma. Patient takes keppra 250 mg BID at home.  CT brain showed acute to subacute bilateral cerebellar and left occipital as well as questionable brainstem infarcts. MRI done 03/10/2014 also confirmed the diagnosis. MRA showed diminished blood flow at posterior circulation. 02/14/2014, pt was not responsive to voice or pain stimulation on UEs but still withdraw to pain LEs, L>R. Contacted on call interventional radiologists and decision made not pursue further intervention due to completed infarcts, high complication risk and poor prognosis. Family was updated and palliative care has been consulted.  SUBJECTIVE Pt son, [name], was at bedside today. Her condition worsened today, not responsive to voice or pain stimulation in all extremities. Hypotensive overnight with intermittent fever. Palliative consult in place. Pt is DNR and she has been expressed her wishes not to be in the state not able to gain meaningful function as per son. Son was informed with the poor prognosis and he expressed understanding.  OBJECTIVE Most recent Vital Signs: Filed Vitals:   March 07, 2014 0630 03/07/2014 0700 Mar 07, 2014 0800 07-Mar-2014 0900  BP: 84/50 85/53 89/53  89/55  Pulse: 105 102 107 105  Temp:   97.8 F (36.6 C)   TempSrc:   Axillary   Resp: 27 25 27 27    Height:      Weight:      SpO2: 94% 93% 90% 89%   CBG (last 3)   Recent Labs  02/27/2014 0038 03/06/2014 1451  GLUCAP 169* 137*    IV Fluid Intake:     MEDICATIONS  . antiseptic oral rinse  15 mL Mouth Rinse BID  . sodium chloride  3 mL Intravenous Q12H   PRN:  acetaminophen, acetaminophen, alum & mag hydroxide-simeth, diazepam, metoprolol, ondansetron (ZOFRAN) IV, ondansetron  Diet:  NPO Activity:  Bedrest DVT Prophylaxis:  SCDs  CLINICALLY SIGNIFICANT STUDIES Basic Metabolic Panel:  Recent Labs Lab 02/22/2014 0058 03/03/2014 0113 02/23/2014 1031 03/07/2014 0232  NA 136* 139  --  141  K 4.8 4.7  --  4.5  CL 97 103  --  103  CO2 19  --   --  19  GLUCOSE 183* 187*  --  132*  BUN 21 21  --  23  CREATININE 1.46* 1.60* 1.37* 1.83*  CALCIUM 9.6  --   --  8.5   Liver Function Tests:  Recent Labs Lab 03/02/2014 0058  AST 19  ALT 11  ALKPHOS 64  BILITOT <0.2*  PROT 7.9  ALBUMIN 3.4*   CBC:  Recent Labs Lab 02/14/2014 0058  02/13/2014 1031 Mar 07, 2014 0232  WBC 8.7  --  12.4* 1.8*  NEUTROABS 6.5  --   --   --   HGB 13.3  < > 13.2 12.5  HCT 38.4  < > 38.7 37.4  MCV 95.8  --  96.8 99.2  PLT 168  --  168 123*  < > = values in this interval not displayed. Coagulation: No results found for this basename: LABPROT, INR,  in the last 168 hours Cardiac Enzymes:  Recent Labs Lab 02/24/2014 0058  CKTOTAL 106   Urinalysis:  Recent Labs Lab 02/27/2014 0314  COLORURINE YELLOW  LABSPEC 1.013  PHURINE 6.0  GLUCOSEU NEGATIVE  HGBUR NEGATIVE  BILIRUBINUR NEGATIVE  KETONESUR NEGATIVE  PROTEINUR 100*  UROBILINOGEN 0.2  NITRITE NEGATIVE  LEUKOCYTESUR NEGATIVE   Lipid Panel    Component Value Date/Time   CHOL 126 2014-03-16 0232   TRIG 58 2014/03/16 0232   HDL 62 16-Mar-2014 0232   CHOLHDL 2.0 2014/03/16 0232   VLDL 12 03/16/2014 0232   LDLCALC 52 03/16/2014 0232   HgbA1C  Lab Results  Component Value Date   HGBA1C 6.4* 06/26/2012    Urine Drug Screen:     Component Value  Date/Time   LABOPIA NONE DETECTED 03/14/2014 0314   COCAINSCRNUR NONE DETECTED 02/27/2014 0314   LABBENZ NONE DETECTED 02/25/2014 0314   AMPHETMU NONE DETECTED 03/13/2014 0314   THCU NONE DETECTED 03/15/2014 0314   LABBARB NONE DETECTED 02/20/2014 0314    Alcohol Level:  Recent Labs Lab 02/28/2014 0058  ETH <11    CT of the brain: acute to subacute bilateral cerebellar and left occipital as well as questionable brainstem infarcts.  MRI and MRA of the brain  IMPRESSION: 1. Acute nonhemorrhagic left PCA territory infarct. 2. Acute nonhemorrhagic right superior cerebellar infarct. 3. At least 2 focal areas of acute nonhemorrhagic infarction involving the left superior cerebellum. 4. No flow is evident on time-of-flight imaging within the vertebral arteries or proximal basilar artery. 5. Minimal flow is evident within the distal basilar artery. The left posterior cerebral artery is occluded. 6. The right posterior cerebral artery is markedly attenuated, filling from a right posterior communicating artery. 7. Mild narrowing of the cavernous left internal carotid artery. 8. Moderate medium and small vessel disease without another significant stenosis in the anterior circulation.  Carotid Doppler   - The vertebral arteries appear patent with antegrade flow. - Findings consistent with 1 - 39 percent stenosis involving the right internal carotid artery and the left internal carotid artery.   2D Echocardiogram   - Normal LV wall thickness with LVEF 16-60%, grade 1 diastolic dysfunction. MAC with trivial mitral regurgitation. Possible PFO noted - can consider agitated saline study or TEE if clinically indicated. Normal PASP 22 mmHg.  CXR  1. No acute findings  EKG  ordered  Physical Exam   Patient in coma, not responsive to voice to pain stimulation. Eyes closed. On face mask for low saturation. Head is nontraumatic. Neck is supple without bruit. Cardiac exam no murmur or gallop. Lungs are decreased  breath sound bilaterally. Distal pulses are weak.  Neurological Exam: pt in coma, not able to respond to either voice or pain. PERRL, sluggish to light, doll's eye not present, nasolabial fold symmetrical, tongue in middle. Not moving all extremities on pain stimulation, withdraws left more than right to pain reflex 1+ throughout and no babinski.  ASSESSMENT Ms. [name] is a 80 y.o. female presenting with sudden onset unresponsiveness. 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 Intervention deemed not indicated due to completed stroke and high risk of complication with poor prognosis. Pt condition worsened with less responsive and hypotension with fever and low saturation. Likely due to septic shock Pt is DNR and has wishes not living with no  meaningful recovery. Palliative care consulted and family updated. Family in agreement with palliative care.    High grade stenosis of posterior circulation  LDL 52  Hospital day # 1  TREATMENT/PLAN  Poor prognosis   I personally updated family at bedside.   Palliative care as per primary team  Discussed with Dr. Wynelle Cleveland.  Will sign off.   SIGNED Rosalin Hawking, MD  I have personally obtained a history, examined the patient, evaluated imaging studies, and formulated the assessment and plan of care. I agree with the above.  Antony Contras, MD  To contact Stroke Continuity provider, please refer to [email or web address] After hours, contact General Neurology

## 2014-03-16 NOTE — Progress Notes (Signed)
[facility] Progress Note   [name] AST:[id number] DOB: 07/04/1931 DOA: 02/22/2014 PCP: Philis Fendt, MD  Brief narrative: [name] is a 80 y.o. female  presents with unresponsiveness. The son states that the patient's friend was at the patient's house and saw the episode. (see consult by Dr Armida Sans). She slumped over in the chair after eating which apparently is normal for her after eating. The friend tried to wake her up to take her to bed but noted that she wasn't responding and was drooling. Her son was called and he found her unresponsive and called EMS. Per Dr Glennon Hamilton note, EMS found her with her jaw clenched and noted incontinence of urine.   Subjective: Non-responsie  Assessment/Plan: Principal Problem:   Unresponsive episode/ B/L Post circulation ischemic CVAs with minimal flow in basilar artery - patient's condition declining with increasing hypoxia, fever, hypotension, oliguria, worsening renal function despite aggressive hydration - I have spoken to the son about starting Palliative care and he agrees - stop IVF and Keppra - keep comfortable on O2 as needed and Valium for seizure- doubt she will need Valium as she is quite unresponsive - have called Palliative care consult-   Active Problems:   Monoclonal gammopathy   History of gout   Hyperlipidemia   Hypertension   GERD (gastroesophageal reflux disease)   CKD (chronic kidney disease) stage 3, GFR 30-59 ml/min    Code Status: DNR Family Communication: with son yesterday and today Disposition Plan: pending on palliative conversation  Consultants: Neuro  Procedures: none  Antibiotics: Anti-infectives   None       Objective: Filed Weights   02/26/2014 0945  Weight: 59.8 kg (131 lb 13.4 oz)    Vitals Filed Vitals:   03/07/2014 0600 03/07/14 0630 March 07, 2014 0700 March 07, 2014 0800  BP: 88/55 84/50 85/53  89/53  Pulse: 99 105 102 107  Temp:    97.8 F (36.6 C)  TempSrc:    Axillary   Resp: 29 27 25 27   Height:      Weight:      SpO2: 92% 94% 93% 90%      Intake/Output Summary (Last 24 hours) at 03/07/14 0854 Last data filed at 2014/03/07 0700  Gross per 24 hour  Intake   2100 ml  Output    250 ml  Net   1850 ml     Exam: Neuro: unresponsive- left pupil dilated and fixed, extremities flaccid Lungs: shallow breaths, poor air entry Cardiovascular: Regular rate and rhythm without murmur gallop or rub normal S1 and S2 Abdomen: Nontender, nondistended, soft, bowel sounds positive, no rebound, no ascites, no appreciable mass Extremities: No significant cyanosis, clubbing, or edema bilateral lower extremities  Data Reviewed: Basic Metabolic Panel:  Recent Labs Lab 02/14/2014 0058 02/19/2014 0113 02/20/2014 1031 03-07-14 0232  NA 136* 139  --  141  K 4.8 4.7  --  4.5  CL 97 103  --  103  CO2 19  --   --  19  GLUCOSE 183* 187*  --  132*  BUN 21 21  --  23  CREATININE 1.46* 1.60* 1.37* 1.83*  CALCIUM 9.6  --   --  8.5   Liver Function Tests:  Recent Labs Lab 02/15/2014 0058  AST 19  ALT 11  ALKPHOS 64  BILITOT <0.2*  PROT 7.9  ALBUMIN 3.4*   No results found for this basename: LIPASE, AMYLASE,  in the last 168 hours No results found for this basename: AMMONIA,  in the  last 168 hours CBC:  Recent Labs Lab 03/01/2014 0058 03/15/2014 0113 02/14/2014 1031 03-14-2014 0232  WBC 8.7  --  12.4* 1.8*  NEUTROABS 6.5  --   --   --   HGB 13.3 15.6* 13.2 12.5  HCT 38.4 46.0 38.7 37.4  MCV 95.8  --  96.8 99.2  PLT 168  --  168 123*   Cardiac Enzymes:  Recent Labs Lab 03/13/2014 0058  CKTOTAL 106   BNP (last 3 results) No results found for this basename: PROBNP,  in the last 8760 hours CBG:  Recent Labs Lab 03/04/2014 0038 02/25/2014 1451  GLUCAP 169* 137*    Recent Results (from the past 240 hour(s))  MRSA PCR SCREENING     Status: None   Collection Time    02/17/2014  9:44 AM      Result Value Ref Range Status   MRSA by PCR NEGATIVE  NEGATIVE Final    Comment:            The GeneXpert MRSA Assay (FDA     approved for NASAL specimens     only), is one component of a     comprehensive MRSA colonization     surveillance program. It is not     intended to diagnose MRSA     infection nor to guide or     monitor treatment for     MRSA infections.     Studies:  Recent x-ray studies have been reviewed in detail by the Attending Physician  Scheduled Meds:  Scheduled Meds: . antiseptic oral rinse  15 mL Mouth Rinse BID  . sodium chloride  3 mL Intravenous Q12H   Continuous Infusions:   Time spent on care of this patient: >35 min   [place], MD 03/14/2014, 8:54 AM  LOS: 1 day   [facility] Office  [phone] Pager - Text Page per Shea Evans   If 7PM-7AM, please contact night-coverage [email or web address]

## 2014-03-16 NOTE — Progress Notes (Signed)
PT Cancellation Note  Patient Details Name: TARONDA COMACHO MRN: [id number] DOB: 05-04-1931   Cancelled Treatment:    Reason Eval/Treat Not Completed: Medical issues which prohibited therapy;Other (comment) (Sign off due to medical decline.  Reorder as appropriate.  ) Thanks.  INGOLD,Felesia Stahlecker 03-08-2014, 9:41 AM [facility] [phone] (pager)

## 2014-03-16 NOTE — Progress Notes (Signed)
BP still hypotensive after 1L bolus of NS. 84/50 and 85/53. Foley placed due to urinary retention.  Pt on 6L [place] and sats low 90's.  Urine output for the shift is 250cc. Dr. Wynelle Cleveland was given an update. No new orders at this time. Will report to AM nurse.  M.Forest Gleason, RN

## 2014-03-16 NOTE — Progress Notes (Signed)
SLP Cancellation Note  Patient Details Name: [name] MRN: [id number] DOB: 1930-10-27   Cancelled treatment:       Reason Eval/Treat Not Completed: Other (comment). Pt has declined and is unresponsive per notes. Plan is for comfort care, will sign off.    Haidyn Kilburg, Katherene Ponto 2014-03-13, 9:33 AM

## 2014-03-16 NOTE — Discharge Summary (Addendum)
Death Summary  [name]:[id number] DOB: 1931-06-17 DOA: 03-15-2014  PCP: Philis Fendt, MD   Admit date: 03-15-14 Date of Death: Mar 16, 2014  Final Diagnoses:  Principal Problem:   Unspecified cerebral artery occlusion with cerebral infarction Active Problems:   Monoclonal gammopathy   History of gout   Hyperlipidemia   Hypertension   GERD (gastroesophageal reflux disease)   CKD (chronic kidney disease) stage 3, GFR 30-59 ml/min    History of present illness:  [name] is a 80 y.o.with a h/o seizure disorder, HTN, CKD 3 and CVT presents with unresponsiveness. Her son, Elberta Fortis was at the bedside and stated that the patient's friend was at the patient's house feeding her dinner and was present during the episode. She slumped over in the chair after eating. This was not unusual as she does tend to fall asleep after eating. However, when the friend tried to wake her up to take her to bed, he noted that she wasn't responding and was drooling. Her son was called, found her unresponsive and called EMS. Per Dr Glennon Hamilton (neurologist) note, EMS found her with her jaw clenched and noted incontinence of urine. We initially suspected that she had a seizure and further work up including and MRI of the brain was ordered.    Hospital Course:  Principal Problem:  Unresponsive episode/ B/L Post circulation ischemic CVAs with minimal flow in basilar artery  - multiple posterior ciculation CVAs due to basilar artery occlusion discovered on MRI- pronosis was quite poor and this was discussed between the family and the neurologist - the patient remained unconscious and condition then further declined on 03/17/23 with increasing hypoxia, fever, hypotension, oliguria, and worsening renal function despite aggressive hydration  - the family decided that it was time to stop aggressive measures and focus on allowing her to be comfortable -she expired on 2023-03-17 at 7:08 PM  Fever of Unknown origin,  hypotension and increased respiratory rate- possible sepsis but source unconfirmed - suspect that she may have aspirated but cannot confirm this - UA on admission was negative but a culture was sent - this has grown out E coli.    Active Problems:  Monoclonal gammopathy  History of gout  Hyperlipidemia  Hypertension  GERD (gastroesophageal reflux disease)  CKD (chronic kidney disease) stage 3, GFR 30-59 ml/min       Time: 30 min  Signed:  Debbe Odea, MD [facility] 03-16-14, 8:55 PM

## 2014-03-16 NOTE — Progress Notes (Signed)
Was asked by family members to come check patient " looks like she passed away" .  Patient was found to have no pulse, no respiration, no BP appreciated and pupils fully dilated and was confirmed by Valentino Saxon.   Kathline Magic , NP notified .  Death pronounced at 50;08pm.  Family members at bedside.  Dr. Alm Bustard also made aware. [place] [facility] service notified.

## 2014-03-16 DEATH — deceased
